# Patient Record
Sex: Male | Born: 1935 | Race: White | Hispanic: No | Marital: Married | Smoking: Never smoker
Health system: Southern US, Community
[De-identification: ages and names within clinical notes are randomized; demographics above are authoritative.]

## PROBLEM LIST (undated history)

## (undated) DIAGNOSIS — I4892 Unspecified atrial flutter: Secondary | ICD-10-CM

## (undated) DIAGNOSIS — I6529 Occlusion and stenosis of unspecified carotid artery: Secondary | ICD-10-CM

## (undated) DIAGNOSIS — N400 Enlarged prostate without lower urinary tract symptoms: Secondary | ICD-10-CM

## (undated) DIAGNOSIS — E785 Hyperlipidemia, unspecified: Secondary | ICD-10-CM

## (undated) DIAGNOSIS — M4800 Spinal stenosis, site unspecified: Secondary | ICD-10-CM

## (undated) DIAGNOSIS — I2510 Atherosclerotic heart disease of native coronary artery without angina pectoris: Secondary | ICD-10-CM

## (undated) DIAGNOSIS — I5042 Chronic combined systolic (congestive) and diastolic (congestive) heart failure: Secondary | ICD-10-CM

## (undated) DIAGNOSIS — G629 Polyneuropathy, unspecified: Secondary | ICD-10-CM

## (undated) DIAGNOSIS — S62102A Fracture of unspecified carpal bone, left wrist, initial encounter for closed fracture: Secondary | ICD-10-CM

## (undated) DIAGNOSIS — E079 Disorder of thyroid, unspecified: Secondary | ICD-10-CM

## (undated) DIAGNOSIS — I4891 Unspecified atrial fibrillation: Secondary | ICD-10-CM

## (undated) DIAGNOSIS — I255 Ischemic cardiomyopathy: Secondary | ICD-10-CM

## (undated) DIAGNOSIS — I5022 Chronic systolic (congestive) heart failure: Secondary | ICD-10-CM

## (undated) HISTORY — PX: CAROTID ENDARTERECTOMY: SUR193

## (undated) HISTORY — PX: TOOTH EXTRACTION: SUR596

## (undated) HISTORY — DX: Ischemic cardiomyopathy: I25.5

## (undated) HISTORY — PX: REPLACEMENT TOTAL KNEE: SUR1224

## (undated) HISTORY — DX: Spinal stenosis, site unspecified: M48.00

## (undated) HISTORY — DX: Atherosclerotic heart disease of native coronary artery without angina pectoris: I25.10

## (undated) HISTORY — DX: Disorder of thyroid, unspecified: E07.9

## (undated) HISTORY — DX: Occlusion and stenosis of unspecified carotid artery: I65.29

## (undated) HISTORY — DX: Fracture of unspecified carpal bone, left wrist, initial encounter for closed fracture: S62.102A

## (undated) HISTORY — DX: Chronic combined systolic (congestive) and diastolic (congestive) heart failure: I50.42

## (undated) HISTORY — DX: Hyperlipidemia, unspecified: E78.5

## (undated) HISTORY — DX: Unspecified atrial flutter: I48.92

## (undated) HISTORY — DX: Polyneuropathy, unspecified: G62.9

## (undated) HISTORY — DX: Unspecified atrial fibrillation: I48.91

## (undated) HISTORY — DX: Chronic systolic (congestive) heart failure: I50.22

---

## 1973-04-30 HISTORY — PX: BACK SURGERY: SHX140

## 1994-04-30 HISTORY — PX: CORONARY ARTERY BYPASS GRAFT: SHX141

## 2005-06-22 ENCOUNTER — Emergency Department: Payer: Self-pay | Admitting: Emergency Medicine

## 2005-07-02 ENCOUNTER — Emergency Department: Payer: Self-pay | Admitting: Emergency Medicine

## 2005-07-04 ENCOUNTER — Emergency Department: Payer: Self-pay | Admitting: Emergency Medicine

## 2005-08-22 ENCOUNTER — Emergency Department: Payer: Self-pay | Admitting: Internal Medicine

## 2006-09-12 ENCOUNTER — Ambulatory Visit: Payer: Self-pay | Admitting: Family Medicine

## 2006-09-20 ENCOUNTER — Inpatient Hospital Stay: Payer: Self-pay | Admitting: Vascular Surgery

## 2006-10-03 ENCOUNTER — Ambulatory Visit: Payer: Self-pay | Admitting: Family Medicine

## 2006-10-14 ENCOUNTER — Ambulatory Visit: Payer: Self-pay | Admitting: Vascular Surgery

## 2006-10-29 ENCOUNTER — Ambulatory Visit: Payer: Self-pay | Admitting: Family Medicine

## 2008-09-29 ENCOUNTER — Ambulatory Visit: Payer: Self-pay | Admitting: Vascular Surgery

## 2008-10-13 ENCOUNTER — Ambulatory Visit: Payer: Self-pay | Admitting: Vascular Surgery

## 2008-10-15 ENCOUNTER — Inpatient Hospital Stay: Payer: Self-pay | Admitting: Vascular Surgery

## 2008-12-23 ENCOUNTER — Encounter: Payer: Self-pay | Admitting: Cardiovascular Disease

## 2009-01-13 ENCOUNTER — Encounter: Payer: Self-pay | Admitting: Cardiovascular Disease

## 2009-01-17 ENCOUNTER — Encounter: Payer: Self-pay | Admitting: Cardiovascular Disease

## 2009-01-19 ENCOUNTER — Encounter: Payer: Self-pay | Admitting: Cardiovascular Disease

## 2009-04-28 ENCOUNTER — Encounter: Payer: Self-pay | Admitting: Cardiovascular Disease

## 2009-06-27 ENCOUNTER — Encounter: Payer: Self-pay | Admitting: Cardiovascular Disease

## 2009-07-13 ENCOUNTER — Encounter: Payer: Self-pay | Admitting: Cardiovascular Disease

## 2009-07-14 ENCOUNTER — Encounter: Payer: Self-pay | Admitting: Cardiovascular Disease

## 2009-12-14 ENCOUNTER — Encounter: Payer: Self-pay | Admitting: Cardiovascular Disease

## 2010-01-09 ENCOUNTER — Ambulatory Visit: Payer: Self-pay | Admitting: Cardiovascular Disease

## 2010-01-09 DIAGNOSIS — E118 Type 2 diabetes mellitus with unspecified complications: Secondary | ICD-10-CM

## 2010-01-09 DIAGNOSIS — I6529 Occlusion and stenosis of unspecified carotid artery: Secondary | ICD-10-CM

## 2010-01-09 DIAGNOSIS — I2510 Atherosclerotic heart disease of native coronary artery without angina pectoris: Secondary | ICD-10-CM | POA: Insufficient documentation

## 2010-01-09 DIAGNOSIS — E785 Hyperlipidemia, unspecified: Secondary | ICD-10-CM | POA: Insufficient documentation

## 2010-01-17 ENCOUNTER — Encounter: Payer: Self-pay | Admitting: Cardiovascular Disease

## 2010-05-30 NOTE — Letter (Signed)
Summary: [facility]   Imported By: Harlon Flor 01/10/2010 08:10:35  _____________________________________________________________________  External Attachment:    Type:   Image     Comment:   External Document

## 2010-05-30 NOTE — Letter (Signed)
Summary: Medical Record Release  Medical Record Release   Imported By: Harlon Flor 01/18/2010 10:19:49  _____________________________________________________________________  External Attachment:    Type:   Image     Comment:   External Document

## 2010-05-30 NOTE — Letter (Signed)
Summary: [facility]   Imported By: Harlon Flor 01/10/2010 08:10:13  _____________________________________________________________________  External Attachment:    Type:   Image     Comment:   External Document

## 2010-05-30 NOTE — Assessment & Plan Note (Signed)
Summary: NP6/AMD   Visit Type:  Initial Consult Primary Provider:  Dewaine Oats, M.D.  CC:  c/o shortness of breath..  History of Present Illness: 77 year old with history of coronary artery disease, bypass surgery in 1996 in [place], peripheral vascular disease with carotid endarterectomy in 2000 and on the left, long history of diabetes, recent worsening back pain with MRI showing spinal stenosis, hyperlipidemia who presents to establish care.  He reports that from a cardiac perspective, he has been doing well. He was seen by an outside cardiologist in [place] and was unhappy with the care there. He reports that recently his hemoglobin A1c was elevated in August of this should have improved as his measurements were taken after several cortisone shots to his back. He is active, recent he walks with his dog with minimal back pain following the cortisone shots.  He did report an episode of significant pain in his back leading to shortness of breath where he had to stop. He had a Holter monitor which she reports was unrevealing. He's not had pain like that since the shots were given and he denies any significant shortness of breath with exertion such as when he walks his dog.  He complains that he is having difficulty urinating. Dr. Arlana Pouch had held his terazosin for hypotension. He will call him tomorrow to see if he can restart the medication. he denies having any dizziness or lightheadedness.  Notes indicate an ejection fraction of 40% in March of 2011 by echocardiogram.  MRI of his back shows severe spinal stenosis at L2 and L3, other foraminal stenoses of the lumbar region.  Preventive Screening-Counseling & Management  Alcohol-Tobacco     Smoking Status: never  Caffeine-Diet-Exercise     Does Patient Exercise: no  Current Medications (verified): 1)  Glipizide 5 Mg Tabs (Glipizide) .... One Tablet Two Times A Day 2)  Aspir-Low 81 Mg Tbec (Aspirin) .... One Tablet Once Daily 3)   Simvastatin 40 Mg Tabs (Simvastatin) .... One Tablet At Bedtime 4)  Lisinopril 2.5 Mg Tabs (Lisinopril) .... One Tablet Once Daily 5)  Meloxicam 15 Mg Tabs (Meloxicam) .... One Tablet Once Daily 6)  Vitamin B-12 250 Mcg Tabs (Cyanocobalamin) .... One Tablet Once Daily 7)  Folic Acid 1 Mg Tabs (Folic Acid) .... One Tablet Once Daily 8)  Atenolol 25 Mg Tabs (Atenolol) .... One Tablet Once Daily 9)  Digoxin 0.25 Mg Tabs (Digoxin) .... One Tablet Once Daily 10)  Terazosin Hcl 1 Mg Caps (Terazosin Hcl) .... One Tablet Once Daily 11)  Aleve 220 Mg Tabs (Naproxen Sodium) .... Two Tablets Once Daily 12)  Metformin Hcl 500 Mg Tabs (Metformin Hcl) .... One Tablet Two Times A Day  Allergies (verified): No Known Drug Allergies  Past History:  Social History: Last updated: 01/09/2010 Retired  Married  Tobacco Use - No.  Alcohol Use - no Regular Exercise - no  Risk Factors: Exercise: no (01/09/2010)  Risk Factors: Smoking Status: never (01/09/2010)  Past Medical History: Diabetes Hyperlipidemia BPH ASHD with previous bypass. peripheral neuropathy spinal stenosis mild hypotension  Past Surgical History: left knee replacement CABG x 3 1996 back surgery 1975 Carotid Endarterectomy 2010  Social History: Retired  Married  Tobacco Use - No.  Alcohol Use - no Regular Exercise - no Smoking Status:  never Does Patient Exercise:  no  Review of Systems  The patient denies fever, weight loss, weight gain, vision loss, decreased hearing, hoarseness, chest pain, syncope, dyspnea on exertion, peripheral edema, prolonged cough, abdominal pain,  incontinence, muscle weakness, depression, and enlarged lymph nodes.    Vital Signs:  Patient profile:   77 year old male Height:      71 inches Weight:      207 pounds BMI:     28.97 Pulse rate:   59 / minute BP sitting:   120 / 62  (left arm) Cuff size:   regular  Vitals Entered By: Bishop Dublin, CMA (January 09, 2010 4:00  PM)  Physical Exam  General:  Well developed, well nourished, in no acute distress. Head:  normocephalic and atraumatic Neck:  Neck supple, no JVD. No masses, thyromegaly or abnormal cervical nodes. Lungs:  Clear bilaterally to auscultation and percussion. Heart:  Non-displaced PMI, chest non-tender; regular rate and rhythm, S1, S2 without murmurs, rubs or gallops. Carotid upstroke normal, no bruit. Normal abdominal aortic size, no bruits. Pedals normal pulses. No edema, no varicosities. Abdomen:  Bowel sounds positive; abdomen soft and non-tender without masses Msk:  Back normal, normal gait. Muscle strength and tone normal. Pulses:  pulses normal in all 4 extremities Extremities:  No clubbing or cyanosis. Neurologic:  Alert and oriented x 3. Skin:  Intact without lesions or rashes. Psych:  Normal affect.   Impression & Recommendations:  Problem # 1:  CAD (ICD-414.00) history of bypass surgery 14 years ago. He denies having any chest pain. No stents placed in the past since his bypass. We'll try to obtain all of his records including his last stress test. He is not interested in performing any testing at this time as he feels well.  I have suggested to him that if he has additional episodes of shortness of breath with exertion as he had previously, that he contact our office for further evaluation. His most recent episode seemed to be associated with severe pain from his back.  His updated medication list for this problem includes:    Aspir-low 81 Mg Tbec (Aspirin) ..... One tablet once daily    Lisinopril 2.5 Mg Tabs (Lisinopril) ..... One tablet once daily    Atenolol 25 Mg Tabs (Atenolol) ..... One tablet once daily  Problem # 2:  HYPERLIPIDEMIA-MIXED (ICD-272.4) His cholesterol was elevated in August. He is above goal of LDL greater than 70. His diabetes was elevated, possibly secondary to his recent cortisone shots. We will continue to closely monitor his diabetes and  lipids and have told him the ideal goal for his cholesterol numbers. We can adjust this in the future if needed.  His updated medication list for this problem includes:    Simvastatin 40 Mg Tabs (Simvastatin) ..... One tablet at bedtime  Problem # 3:  DIAB W/O COMP TYPE II/UNS NOT STATED UNCNTRL (ICD-250.00) he reports that he is very careful with his diet and his sugars have improved after the epidurals.  His updated medication list for this problem includes:    Glipizide 5 Mg Tabs (Glipizide) ..... One tablet two times a day    Aspir-low 81 Mg Tbec (Aspirin) ..... One tablet once daily    Lisinopril 2.5 Mg Tabs (Lisinopril) ..... One tablet once daily    Metformin Hcl 500 Mg Tabs (Metformin hcl) ..... One tablet two times a day  Problem # 4:  CAROTID ARTERY STENOSIS, WITHOUT INFARCTION (ICD-433.10) We will try to obtain a carotid ultrasound and lower extremity arterial ultrasound from Dr. Earnestine Leys for our records.  His updated medication list for this problem includes:    Aspir-low 81 Mg Tbec (Aspirin) ..... One tablet once daily  Other Orders: EKG w/ Interpretation (93000)

## 2010-05-30 NOTE — Letter (Signed)
Summary: [facility]  [facility]   Imported By: Marylou Mccoy 02/01/2010 11:46:49  _____________________________________________________________________  External Attachment:    Type:   Image     Comment:   External Document

## 2010-05-30 NOTE — Letter (Signed)
Summary: [facility]  [facility]   Imported By: Marylou Mccoy 02/01/2010 11:47:40  _____________________________________________________________________  External Attachment:    Type:   Image     Comment:   External Document

## 2010-05-30 NOTE — Letter (Signed)
Summary: [place] - Carotid Duplex Exam   [facility]   Imported By: Marylou Mccoy 02/01/2010 11:45:52  _____________________________________________________________________  External Attachment:    Type:   Image     Comment:   External Document

## 2010-05-30 NOTE — Letter (Signed)
Summary: [facility]   Imported By: Harlon Flor 01/10/2010 08:09:53  _____________________________________________________________________  External Attachment:    Type:   Image     Comment:   External Document

## 2010-05-30 NOTE — Letter (Signed)
Summary: PHI  PHI   Imported By: Harlon Flor 01/10/2010 15:02:50  _____________________________________________________________________  External Attachment:    Type:   Image     Comment:   External Document

## 2010-05-30 NOTE — Letter (Signed)
Summary: [facility] - Office Note  [facility] - Office Note   Imported By: Marylou Mccoy 02/01/2010 11:48:40  _____________________________________________________________________  External Attachment:    Type:   Image     Comment:   External Document

## 2010-06-27 ENCOUNTER — Telehealth: Payer: Self-pay | Admitting: Cardiovascular Disease

## 2010-07-06 ENCOUNTER — Encounter: Payer: Self-pay | Admitting: Cardiovascular Disease

## 2010-07-06 NOTE — Progress Notes (Signed)
Summary: Return Visit?  Phone Note Call from Patient Call back at [facility] Phone [phone]   Caller: Self Call For: Lasasha Brophy Summary of Call: When does the patient need to be seen again?  It does not say in his last note. Initial call taken by: Harlon Flor,  June 27, 2010 1:39 PM  Follow-up for Phone Call        I typically see patients with CABG every 6 months

## 2010-07-13 ENCOUNTER — Ambulatory Visit: Payer: Self-pay | Admitting: Cardiovascular Disease

## 2010-08-01 ENCOUNTER — Ambulatory Visit (INDEPENDENT_AMBULATORY_CARE_PROVIDER_SITE_OTHER): Payer: Medicare Other | Admitting: Cardiovascular Disease

## 2010-08-01 ENCOUNTER — Encounter: Payer: Self-pay | Admitting: Cardiovascular Disease

## 2010-08-01 DIAGNOSIS — E785 Hyperlipidemia, unspecified: Secondary | ICD-10-CM

## 2010-08-01 DIAGNOSIS — I2510 Atherosclerotic heart disease of native coronary artery without angina pectoris: Secondary | ICD-10-CM

## 2010-08-01 DIAGNOSIS — E119 Type 2 diabetes mellitus without complications: Secondary | ICD-10-CM

## 2010-08-01 DIAGNOSIS — I6529 Occlusion and stenosis of unspecified carotid artery: Secondary | ICD-10-CM

## 2010-08-01 NOTE — Patient Instructions (Signed)
Continue current medications. Continue to watch your diet. Your physician recommends that you schedule a follow-up appointment in: 6 months

## 2010-08-01 NOTE — Assessment & Plan Note (Signed)
No further testing at this time. Have asked him to contact me if he has worsening shortness of breath and chest pain with minimal exertion as we could perform a stress test or catheterization.

## 2010-08-01 NOTE — Progress Notes (Signed)
   Patient ID: Mark Dougherty, male    DOB: December 24, 1926, 77 y.o.   MRN: [id number]  HPI Comments: 77 year old with history of coronary artery disease, bypass surgery in 1996 in [place], peripheral vascular disease with carotid endarterectomy in 2000 and on the left, long history of diabetes, recent worsening back pain with MRI showing spinal stenosis, hyperlipidemia who presents to establish care.  He reports that from a cardiac perspective, he has been doing well. He does have some chest tightness and shortness of breath from underlying severe back pain. This comes on when he bends over to walk. If he uses his walker, he does not have any significant chest tightness or shortness of breath. He also does not have significant back pain if he uses his walker.  He is not able to walk long distances or exercise to any degree because of his ongoing back discomfort.  Notes indicate an ejection fraction of 40% in March of 2011 by echocardiogram.  MRI of his back shows severe spinal stenosis at L2 and L3, other foraminal stenoses of the lumbar region.     Review of Systems  Constitutional: Negative.   HENT: Negative.   Eyes: Negative.   Respiratory: Positive for shortness of breath. Negative for cough, choking, chest tightness and wheezing.   Cardiovascular: Negative.   Gastrointestinal: Negative.   Musculoskeletal: Positive for back pain and arthralgias. Negative for myalgias, joint swelling and gait problem.  Skin: Negative.   Neurological: Negative.   Hematological: Negative.   Psychiatric/Behavioral: Negative.    BP 90/60  Pulse 59  Ht 5\' 11"  (1.803 m)  Wt 211 lb (95.709 kg)  BMI 29.43 kg/m2   Physical Exam  [nursing notereviewed. Constitutional: He is oriented to person, place, and time. He appears well-developed and well-nourished.  HENT:  Head: Normocephalic.  Nose: Nose normal.  Mouth/Throat: Oropharynx is clear and moist.  Eyes: Conjunctivae are normal. Pupils are equal, round,  and reactive to light.  Neck: Normal range of motion. Neck supple. No JVD present.  Cardiovascular: Normal rate, regular rhythm, normal heart sounds and intact distal pulses.  Exam reveals no gallop and no friction rub.   No murmur heard. Pulmonary/Chest: Effort normal and breath sounds normal. No respiratory distress. He has no wheezes. He has no rales. He exhibits no tenderness.  Abdominal: Soft. Bowel sounds are normal. He exhibits no distension. There is no tenderness.  Musculoskeletal: Normal range of motion. He exhibits no edema and no tenderness.  Lymphadenopathy:    He has no cervical adenopathy.  Neurological: He is alert and oriented to person, place, and time. Coordination normal.  Skin: Skin is warm and dry. No rash noted. No erythema.  Psychiatric: He has a normal mood and affect. His behavior is normal. Judgment and thought content normal.           Assessment and Plan

## 2010-08-01 NOTE — Assessment & Plan Note (Signed)
Clinical auscultation suggests no severe stenoses. We'll continue aggressive medical management. Repeat ultrasound next year.

## 2010-08-01 NOTE — Assessment & Plan Note (Signed)
Hemoglobin A1c 8.7. They're encouraged him to watch his diet.Marland Kitchen

## 2010-08-01 NOTE — Assessment & Plan Note (Signed)
Continue current medication. Goal LDL less than 70. He is slightly above this and we have suggested he work on his diabetes.

## 2010-08-17 ENCOUNTER — Telehealth: Payer: Self-pay | Admitting: Cardiovascular Disease

## 2010-08-17 NOTE — Telephone Encounter (Signed)
Pt had appt with Dr. Mariah Milling 08/01/10. Is he clear to have surgery?

## 2010-08-17 NOTE — Telephone Encounter (Signed)
Needs surgical clearance for back surgery with [facility].  Phone # [phone] Fax # [phone]

## 2010-08-17 NOTE — Telephone Encounter (Signed)
I think I signed a piece of paper that was in the file. Ok to proceed with surgery

## 2010-08-18 NOTE — Telephone Encounter (Signed)
Spoke to RN at [employer] [facility], asked them to re-fax clearance form, when they did, it was the one that has already been filled out and signed by Dr. Mariah Milling. Called pt also and LM of this information on VM. Pt is cleared for surgery and documentation has been sent.

## 2010-08-23 ENCOUNTER — Ambulatory Visit (HOSPITAL_COMMUNITY)
Admission: RE | Admit: 2010-08-23 | Discharge: 2010-08-23 | Disposition: A | Discharge status: Home / Self Care | Payer: Medicare Other | Source: Ambulatory Visit | Attending: Specialist | Admitting: Specialist

## 2010-08-23 ENCOUNTER — Other Ambulatory Visit (HOSPITAL_COMMUNITY): Payer: Self-pay | Admitting: Specialist

## 2010-08-23 ENCOUNTER — Other Ambulatory Visit (HOSPITAL_COMMUNITY): Payer: Self-pay

## 2010-08-23 ENCOUNTER — Encounter (HOSPITAL_COMMUNITY)
Admission: RE | Admit: 2010-08-23 | Discharge: 2010-08-23 | Disposition: A | Discharge status: Home / Self Care | Payer: Medicare Other | Source: Ambulatory Visit | Attending: Specialist | Admitting: Specialist

## 2010-08-23 DIAGNOSIS — M48061 Spinal stenosis, lumbar region without neurogenic claudication: Secondary | ICD-10-CM | POA: Insufficient documentation

## 2010-08-23 DIAGNOSIS — Z01812 Encounter for preprocedural laboratory examination: Secondary | ICD-10-CM | POA: Insufficient documentation

## 2010-08-23 DIAGNOSIS — Z01811 Encounter for preprocedural respiratory examination: Secondary | ICD-10-CM | POA: Insufficient documentation

## 2010-08-23 DIAGNOSIS — E119 Type 2 diabetes mellitus without complications: Secondary | ICD-10-CM | POA: Insufficient documentation

## 2010-08-23 LAB — URINALYSIS, ROUTINE W REFLEX MICROSCOPIC
Nitrite: NEGATIVE
Protein, ur: NEGATIVE mg/dL
Specific Gravity, Urine: 1.031 — ABNORMAL HIGH (ref 1.005–1.030)
Urobilinogen, UA: 1 mg/dL (ref 0.0–1.0)

## 2010-08-23 LAB — DIFFERENTIAL
Basophils Absolute: 0 10*3/uL (ref 0.0–0.1)
Eosinophils Absolute: 0.3 10*3/uL (ref 0.0–0.7)
Eosinophils Relative: 3 % (ref 0–5)
Monocytes Absolute: 1.1 10*3/uL — ABNORMAL HIGH (ref 0.1–1.0)

## 2010-08-23 LAB — COMPREHENSIVE METABOLIC PANEL
Albumin: 3.5 g/dL (ref 3.5–5.2)
Alkaline Phosphatase: 54 U/L (ref 39–117)
BUN: 30 mg/dL — ABNORMAL HIGH (ref 6–23)
Chloride: 107 mEq/L (ref 96–112)
Creatinine, Ser: 1.36 mg/dL (ref 0.4–1.5)
GFR calc non Af Amer: 50 mL/min — ABNORMAL LOW (ref >60)
Glucose, Bld: 152 mg/dL — ABNORMAL HIGH (ref 70–99)
Potassium: 4.7 mEq/L (ref 3.5–5.1)
Total Bilirubin: 0.3 mg/dL (ref 0.3–1.2)

## 2010-08-23 LAB — SURGICAL PCR SCREEN: Staphylococcus aureus: POSITIVE — AB

## 2010-08-23 LAB — CBC
HCT: 39 % (ref 39.0–52.0)
MCHC: 33.3 g/dL (ref 30.0–36.0)
MCV: 95.8 fL (ref 78.0–100.0)
Platelets: 256 10*3/uL (ref 150–400)
RDW: 12.7 % (ref 11.5–15.5)
WBC: 9.8 10*3/uL (ref 4.0–10.5)

## 2010-08-23 LAB — APTT: aPTT: 28 seconds (ref 24–37)

## 2010-08-23 LAB — PROTIME-INR: INR: 0.95 (ref 0.00–1.49)

## 2010-08-25 ENCOUNTER — Telehealth: Payer: Self-pay | Admitting: Cardiovascular Disease

## 2010-08-25 NOTE — Telephone Encounter (Signed)
LOV,12 lead faxed to [facility] @ [phone] 08/25/10/KM

## 2010-08-28 ENCOUNTER — Inpatient Hospital Stay (HOSPITAL_COMMUNITY): Admission: RE | Admit: 2010-08-28 | Payer: Self-pay | Source: Ambulatory Visit | Admitting: Specialist

## 2010-08-28 ENCOUNTER — Inpatient Hospital Stay (HOSPITAL_COMMUNITY): Payer: Medicare Other

## 2010-08-28 ENCOUNTER — Inpatient Hospital Stay (HOSPITAL_COMMUNITY)
Admission: RE | Admit: 2010-08-28 | Discharge: 2010-09-04 | DRG: 490 | Disposition: A | Discharge status: Home Health | Payer: Medicare Other | Source: Ambulatory Visit | Attending: Specialist | Admitting: Specialist

## 2010-08-28 DIAGNOSIS — E871 Hypo-osmolality and hyponatremia: Secondary | ICD-10-CM | POA: Diagnosis not present

## 2010-08-28 DIAGNOSIS — T4275XA Adverse effect of unspecified antiepileptic and sedative-hypnotic drugs, initial encounter: Secondary | ICD-10-CM | POA: Diagnosis not present

## 2010-08-28 DIAGNOSIS — I519 Heart disease, unspecified: Secondary | ICD-10-CM | POA: Diagnosis not present

## 2010-08-28 DIAGNOSIS — M48061 Spinal stenosis, lumbar region without neurogenic claudication: Principal | ICD-10-CM | POA: Diagnosis present

## 2010-08-28 DIAGNOSIS — I509 Heart failure, unspecified: Secondary | ICD-10-CM | POA: Diagnosis present

## 2010-08-28 DIAGNOSIS — E119 Type 2 diabetes mellitus without complications: Secondary | ICD-10-CM | POA: Diagnosis present

## 2010-08-28 DIAGNOSIS — I10 Essential (primary) hypertension: Secondary | ICD-10-CM | POA: Diagnosis present

## 2010-08-28 DIAGNOSIS — F29 Unspecified psychosis not due to a substance or known physiological condition: Secondary | ICD-10-CM | POA: Diagnosis not present

## 2010-08-28 DIAGNOSIS — I4892 Unspecified atrial flutter: Secondary | ICD-10-CM | POA: Diagnosis not present

## 2010-08-28 DIAGNOSIS — K929 Disease of digestive system, unspecified: Secondary | ICD-10-CM | POA: Diagnosis not present

## 2010-08-28 DIAGNOSIS — D62 Acute posthemorrhagic anemia: Secondary | ICD-10-CM | POA: Diagnosis not present

## 2010-08-28 DIAGNOSIS — Z951 Presence of aortocoronary bypass graft: Secondary | ICD-10-CM

## 2010-08-28 DIAGNOSIS — K560 Paralytic ileus: Secondary | ICD-10-CM | POA: Diagnosis not present

## 2010-08-28 DIAGNOSIS — I498 Other specified cardiac arrhythmias: Secondary | ICD-10-CM | POA: Diagnosis not present

## 2010-08-28 DIAGNOSIS — I2510 Atherosclerotic heart disease of native coronary artery without angina pectoris: Secondary | ICD-10-CM | POA: Diagnosis present

## 2010-08-28 DIAGNOSIS — M4310 Spondylolisthesis, site unspecified: Secondary | ICD-10-CM | POA: Diagnosis present

## 2010-08-28 LAB — GLUCOSE, CAPILLARY
Glucose-Capillary: 139 mg/dL — ABNORMAL HIGH (ref 70–99)
Glucose-Capillary: 137 mg/dL — ABNORMAL HIGH (ref 70–99)
Glucose-Capillary: 114 mg/dL — ABNORMAL HIGH (ref 70–99)

## 2010-08-29 DIAGNOSIS — R002 Palpitations: Secondary | ICD-10-CM

## 2010-08-29 LAB — BASIC METABOLIC PANEL
CO2: 26 mEq/L (ref 19–32)
Chloride: 103 mEq/L (ref 96–112)
Creatinine, Ser: 1.11 mg/dL (ref 0.4–1.5)
GFR calc Af Amer: >60 mL/min (ref >60)
Glucose, Bld: 125 mg/dL — ABNORMAL HIGH (ref 70–99)

## 2010-08-29 LAB — CBC
Hemoglobin: 10.7 g/dL — ABNORMAL LOW (ref 13.0–17.0)
MCH: 31.4 pg (ref 26.0–34.0)
MCV: 95.6 fL (ref 78.0–100.0)
RBC: 3.41 MIL/uL — ABNORMAL LOW (ref 4.22–5.81)

## 2010-08-29 LAB — URINALYSIS, ROUTINE W REFLEX MICROSCOPIC
Glucose, UA: NEGATIVE mg/dL
Leukocytes, UA: NEGATIVE
Protein, ur: NEGATIVE mg/dL
Specific Gravity, Urine: 1.015 (ref 1.005–1.030)
Urobilinogen, UA: 0.2 mg/dL (ref 0.0–1.0)

## 2010-08-29 LAB — URINE MICROSCOPIC-ADD ON

## 2010-08-29 LAB — GLUCOSE, CAPILLARY

## 2010-08-30 DIAGNOSIS — I4891 Unspecified atrial fibrillation: Secondary | ICD-10-CM

## 2010-08-30 LAB — GLUCOSE, CAPILLARY
Glucose-Capillary: 143 mg/dL — ABNORMAL HIGH (ref 70–99)
Glucose-Capillary: 148 mg/dL — ABNORMAL HIGH (ref 70–99)
Glucose-Capillary: 170 mg/dL — ABNORMAL HIGH (ref 70–99)

## 2010-08-30 LAB — BASIC METABOLIC PANEL
CO2: 25 mEq/L (ref 19–32)
Chloride: 99 mEq/L (ref 96–112)
Creatinine, Ser: 1.27 mg/dL (ref 0.4–1.5)
GFR calc Af Amer: >60 mL/min (ref >60)
Glucose, Bld: 145 mg/dL — ABNORMAL HIGH (ref 70–99)

## 2010-08-30 LAB — HEMOGLOBIN AND HEMATOCRIT, BLOOD
HCT: 30 % — ABNORMAL LOW (ref 39.0–52.0)
Hemoglobin: 9.9 g/dL — ABNORMAL LOW (ref 13.0–17.0)

## 2010-08-30 LAB — CARDIAC PANEL(CRET KIN+CKTOT+MB+TROPI): Troponin I: <0.3 ng/mL (ref <0.30)

## 2010-08-31 DIAGNOSIS — IMO0002 Reserved for concepts with insufficient information to code with codable children: Secondary | ICD-10-CM

## 2010-08-31 DIAGNOSIS — M47817 Spondylosis without myelopathy or radiculopathy, lumbosacral region: Secondary | ICD-10-CM

## 2010-08-31 LAB — BASIC METABOLIC PANEL
CO2: 25 mEq/L (ref 19–32)
Calcium: 8.9 mg/dL (ref 8.4–10.5)
Creatinine, Ser: 1.32 mg/dL (ref 0.4–1.5)
GFR calc Af Amer: >60 mL/min (ref >60)
Glucose, Bld: 173 mg/dL — ABNORMAL HIGH (ref 70–99)

## 2010-08-31 LAB — TYPE AND SCREEN
ABO/RH(D): O POS
Antibody Screen: NEGATIVE
Unit division: 0

## 2010-08-31 LAB — GLUCOSE, CAPILLARY
Glucose-Capillary: 182 mg/dL — ABNORMAL HIGH (ref 70–99)
Glucose-Capillary: 193 mg/dL — ABNORMAL HIGH (ref 70–99)
Glucose-Capillary: 217 mg/dL — ABNORMAL HIGH (ref 70–99)

## 2010-09-01 DIAGNOSIS — I359 Nonrheumatic aortic valve disorder, unspecified: Secondary | ICD-10-CM

## 2010-09-01 LAB — CBC
HCT: 26.6 % — ABNORMAL LOW (ref 39.0–52.0)
Hemoglobin: 8.9 g/dL — ABNORMAL LOW (ref 13.0–17.0)
WBC: 9.5 10*3/uL (ref 4.0–10.5)

## 2010-09-01 LAB — BASIC METABOLIC PANEL
CO2: 26 mEq/L (ref 19–32)
Chloride: 103 mEq/L (ref 96–112)
Glucose, Bld: 139 mg/dL — ABNORMAL HIGH (ref 70–99)
Potassium: 4.5 mEq/L (ref 3.5–5.1)
Sodium: 132 mEq/L — ABNORMAL LOW (ref 135–145)

## 2010-09-01 LAB — GLUCOSE, CAPILLARY
Glucose-Capillary: 139 mg/dL — ABNORMAL HIGH (ref 70–99)
Glucose-Capillary: 152 mg/dL — ABNORMAL HIGH (ref 70–99)

## 2010-09-02 LAB — CBC
MCH: 31 pg (ref 26.0–34.0)
MCV: 93.5 fL (ref 78.0–100.0)
Platelets: 234 10*3/uL (ref 150–400)
RBC: 2.94 MIL/uL — ABNORMAL LOW (ref 4.22–5.81)
RDW: 12.5 % (ref 11.5–15.5)

## 2010-09-02 LAB — BASIC METABOLIC PANEL
Chloride: 103 mEq/L (ref 96–112)
GFR calc Af Amer: >60 mL/min (ref >60)
GFR calc non Af Amer: >60 mL/min (ref >60)
Potassium: 4.7 mEq/L (ref 3.5–5.1)
Sodium: 136 mEq/L (ref 135–145)

## 2010-09-02 LAB — HEPATIC FUNCTION PANEL
ALT: 15 U/L (ref 0–53)
Bilirubin, Direct: 0.1 mg/dL (ref 0.0–0.3)
Indirect Bilirubin: 0.2 mg/dL — ABNORMAL LOW (ref 0.3–0.9)
Total Protein: 5.7 g/dL — ABNORMAL LOW (ref 6.0–8.3)

## 2010-09-02 LAB — GLUCOSE, CAPILLARY
Glucose-Capillary: 159 mg/dL — ABNORMAL HIGH (ref 70–99)
Glucose-Capillary: 254 mg/dL — ABNORMAL HIGH (ref 70–99)
Glucose-Capillary: 167 mg/dL — ABNORMAL HIGH (ref 70–99)

## 2010-09-02 LAB — TSH: TSH: 2.159 u[IU]/mL (ref 0.350–4.500)

## 2010-09-03 LAB — GLUCOSE, CAPILLARY
Glucose-Capillary: 135 mg/dL — ABNORMAL HIGH (ref 70–99)
Glucose-Capillary: 211 mg/dL — ABNORMAL HIGH (ref 70–99)

## 2010-09-04 LAB — GLUCOSE, CAPILLARY: Glucose-Capillary: 220 mg/dL — ABNORMAL HIGH (ref 70–99)

## 2010-09-11 ENCOUNTER — Emergency Department (HOSPITAL_COMMUNITY): Payer: Medicare Other

## 2010-09-11 ENCOUNTER — Inpatient Hospital Stay (HOSPITAL_COMMUNITY)
Admission: EM | Admit: 2010-09-11 | Discharge: 2010-09-12 | DRG: 683 | Disposition: A | Discharge status: Home Health | Payer: Medicare Other | Attending: Internal Medicine | Admitting: Internal Medicine

## 2010-09-11 DIAGNOSIS — Z7982 Long term (current) use of aspirin: Secondary | ICD-10-CM

## 2010-09-11 DIAGNOSIS — I5022 Chronic systolic (congestive) heart failure: Secondary | ICD-10-CM | POA: Diagnosis present

## 2010-09-11 DIAGNOSIS — I509 Heart failure, unspecified: Secondary | ICD-10-CM | POA: Diagnosis present

## 2010-09-11 DIAGNOSIS — I959 Hypotension, unspecified: Secondary | ICD-10-CM | POA: Diagnosis present

## 2010-09-11 DIAGNOSIS — Z951 Presence of aortocoronary bypass graft: Secondary | ICD-10-CM

## 2010-09-11 DIAGNOSIS — R55 Syncope and collapse: Secondary | ICD-10-CM | POA: Diagnosis present

## 2010-09-11 DIAGNOSIS — R195 Other fecal abnormalities: Secondary | ICD-10-CM | POA: Diagnosis present

## 2010-09-11 DIAGNOSIS — T481X5A Adverse effect of skeletal muscle relaxants [neuromuscular blocking agents], initial encounter: Secondary | ICD-10-CM | POA: Diagnosis present

## 2010-09-11 DIAGNOSIS — I10 Essential (primary) hypertension: Secondary | ICD-10-CM | POA: Diagnosis present

## 2010-09-11 DIAGNOSIS — E785 Hyperlipidemia, unspecified: Secondary | ICD-10-CM | POA: Diagnosis present

## 2010-09-11 DIAGNOSIS — E860 Dehydration: Secondary | ICD-10-CM | POA: Diagnosis present

## 2010-09-11 DIAGNOSIS — R404 Transient alteration of awareness: Secondary | ICD-10-CM | POA: Diagnosis present

## 2010-09-11 DIAGNOSIS — E119 Type 2 diabetes mellitus without complications: Secondary | ICD-10-CM | POA: Diagnosis present

## 2010-09-11 DIAGNOSIS — N179 Acute kidney failure, unspecified: Principal | ICD-10-CM | POA: Diagnosis present

## 2010-09-11 DIAGNOSIS — D649 Anemia, unspecified: Secondary | ICD-10-CM | POA: Diagnosis present

## 2010-09-11 DIAGNOSIS — Z9889 Other specified postprocedural states: Secondary | ICD-10-CM

## 2010-09-11 DIAGNOSIS — I2510 Atherosclerotic heart disease of native coronary artery without angina pectoris: Secondary | ICD-10-CM | POA: Diagnosis present

## 2010-09-11 DIAGNOSIS — N400 Enlarged prostate without lower urinary tract symptoms: Secondary | ICD-10-CM | POA: Diagnosis present

## 2010-09-11 DIAGNOSIS — T465X5A Adverse effect of other antihypertensive drugs, initial encounter: Secondary | ICD-10-CM | POA: Diagnosis present

## 2010-09-11 DIAGNOSIS — Z79899 Other long term (current) drug therapy: Secondary | ICD-10-CM

## 2010-09-11 LAB — PROTIME-INR
INR: 1.12 (ref 0.00–1.49)
Prothrombin Time: 14.6 seconds (ref 11.6–15.2)

## 2010-09-11 LAB — APTT: aPTT: 31 seconds (ref 24–37)

## 2010-09-11 LAB — CBC
Hemoglobin: 11.6 g/dL — ABNORMAL LOW (ref 13.0–17.0)
MCH: 30.9 pg (ref 26.0–34.0)
MCV: 93.9 fL (ref 78.0–100.0)
RBC: 3.75 MIL/uL — ABNORMAL LOW (ref 4.22–5.81)

## 2010-09-11 LAB — COMPREHENSIVE METABOLIC PANEL
ALT: 14 U/L (ref 0–53)
Albumin: 3.1 g/dL — ABNORMAL LOW (ref 3.5–5.2)
BUN: 25 mg/dL — ABNORMAL HIGH (ref 6–23)
Calcium: 9.4 mg/dL (ref 8.4–10.5)
Glucose, Bld: 196 mg/dL — ABNORMAL HIGH (ref 70–99)
Sodium: 133 mEq/L — ABNORMAL LOW (ref 135–145)
Total Protein: 6.8 g/dL (ref 6.0–8.3)

## 2010-09-11 LAB — POCT CARDIAC MARKERS
CKMB, poc: 1.1 ng/mL (ref 1.0–8.0)
Myoglobin, poc: 161 ng/mL (ref 12–200)
Troponin i, poc: <0.05 ng/mL (ref 0.00–0.09)

## 2010-09-11 LAB — DIFFERENTIAL
Lymphs Abs: 2 10*3/uL (ref 0.7–4.0)
Monocytes Absolute: 1.5 10*3/uL — ABNORMAL HIGH (ref 0.1–1.0)
Monocytes Relative: 12 % (ref 3–12)
Neutro Abs: 8.1 10*3/uL — ABNORMAL HIGH (ref 1.7–7.7)
Neutrophils Relative %: 69 % (ref 43–77)

## 2010-09-11 LAB — SAMPLE TO BLOOD BANK

## 2010-09-11 LAB — CARDIAC PANEL(CRET KIN+CKTOT+MB+TROPI)
Relative Index: INVALID (ref 0.0–2.5)
Total CK: 34 U/L (ref 7–232)

## 2010-09-12 LAB — GLUCOSE, CAPILLARY
Glucose-Capillary: 124 mg/dL — ABNORMAL HIGH (ref 70–99)
Glucose-Capillary: 138 mg/dL — ABNORMAL HIGH (ref 70–99)
Glucose-Capillary: 125 mg/dL — ABNORMAL HIGH (ref 70–99)

## 2010-09-12 LAB — HEMOGLOBIN A1C
Hgb A1c MFr Bld: 7.2 % — ABNORMAL HIGH (ref <5.7)
Mean Plasma Glucose: 160 mg/dL — ABNORMAL HIGH (ref <117)

## 2010-09-12 LAB — CBC
MCH: 30.6 pg (ref 26.0–34.0)
MCHC: 33.1 g/dL (ref 30.0–36.0)
MCV: 92.5 fL (ref 78.0–100.0)
Platelets: 422 10*3/uL — ABNORMAL HIGH (ref 150–400)
RDW: 12.7 % (ref 11.5–15.5)

## 2010-09-12 LAB — BASIC METABOLIC PANEL
BUN: 20 mg/dL (ref 6–23)
CO2: 24 mEq/L (ref 19–32)
Chloride: 99 mEq/L (ref 96–112)
Glucose, Bld: 103 mg/dL — ABNORMAL HIGH (ref 70–99)
Potassium: 4.1 mEq/L (ref 3.5–5.1)

## 2010-09-12 LAB — URINALYSIS, ROUTINE W REFLEX MICROSCOPIC
Bilirubin Urine: NEGATIVE
Hgb urine dipstick: NEGATIVE
Nitrite: NEGATIVE
Protein, ur: NEGATIVE mg/dL
Urobilinogen, UA: 0.2 mg/dL (ref 0.0–1.0)

## 2010-09-12 LAB — CARDIAC PANEL(CRET KIN+CKTOT+MB+TROPI)
CK, MB: 1.6 ng/mL (ref 0.3–4.0)
Total CK: 40 U/L (ref 7–232)
Troponin I: <0.3 ng/mL (ref <0.30)
Troponin I: <0.3 ng/mL (ref <0.30)

## 2010-09-12 LAB — TSH: TSH: 2.459 u[IU]/mL (ref 0.350–4.500)

## 2010-09-12 LAB — CORTISOL: Cortisol, Plasma: 8.6 ug/dL

## 2010-09-13 LAB — URINE CULTURE: Culture  Setup Time: 201205150942

## 2010-09-13 NOTE — Op Note (Signed)
NAME:  Mark Dougherty, Mark Dougherty NO.:  [id number]  MEDICAL RECORD NO.:  [id number]           PATIENT TYPE:  I  LOCATION:  5025                         FACILITY:  [facility]  PHYSICIAN:  Kerrin Champagne, M.D.   DATE OF BIRTH:  14-Sep-1926  DATE OF PROCEDURE:  08/28/2010 DATE OF DISCHARGE:                              OPERATIVE REPORT   PREOPERATIVE DIAGNOSES:  Lumbar spinal stenosis L2-3 and L4-5 central with foraminal narrowing at both levels.  Grade 1 spondylolisthesis at L4-5.  Previous central laminectomy at L5.  Left-sided lateral recess stenosis at L5-S1, previous left L5-S1 semihemilaminectomy.  POSTOPERATIVE DIAGNOSIS:  Grade 1 spondylolisthesis L4-5 degenerative type with severe lateral recess stenosis at the L4-5 level affecting left side greater than right side L5 nerve root, foraminal entrapment involving bilateral L4 nerve roots and left side L3 nerve root greater than right.  Left-sided lateral recess stenosis L5-S1.  PROCEDURE PERFORMED DURING THIS OPERATION:  Central laminectomy L2-3, redo central laminectomy L4-5 with bilateral L4 and bilateral L5 nerve root decompression.  Left L5-S1 redo partial hemilaminectomy for lateral recess decompression.  Decompression of bilateral L3, bilateral L4, bilateral L2, bilateral L5, and left side S1 nerve roots.  SURGEON:  Kerrin Champagne, MD  ASSISTANT:  Wende Neighbors, PA  ANESTHESIA:  General via orotracheal intubation, Dr. Laverle Hobby, supplemented with local infiltration Marcaine 0.5% in 1:200,000 epinephrine, 20 mL.  ESTIMATED BLOOD LOSS:  200 mL.  DRAINS:  Foley to straight drain and Hemovac x1.  BRIEF CLINICAL HISTORY:  The patient is an 77 year old male who has long history of lumbar spinal stenosis for which he has received numerous epidural steroid injections.  Pain had been alleviated by sitting and standing, worsens by standing and walking.  Overtime, he has problems with neurogenic claudication  appeared to be worsening to the point where he is unable to stand or ambulate more than 100 feet at the most before he must find a place to sit.  He has been seen in the office, evaluated. Epidural steroids  no longer are effective in treating his condition of spinal stenosis.  He has MRI evidence of central spinal stenosis at the L2-3 level and L4-5 and lateral recess stenosis left side L5 and L1. Pain pattern is primarily left sided into the thigh and groin area as well as buttock.  As he has failed conservative management over long periods of time, we recommended that he undergo central decompressive laminectomy at L2-3 and L4-5, left lateral recess decompression of L5- S1.  Intraoperative findings as above.  The patient was seen in the office in discussion regarding risks and benefits of the procedure described to the patient.  He has decided to undergo the decompressive laminectomy without fusion.  Risks and benefits have been discussed and signed informed consent.  DESCRIPTION OF PROCEDURE:  The patient seen in preoperative holding area and marking the left side at the L5-S1 level with an X and my initial and centrally about L2-3 and L4-5 level.  Previous old incision scar in the midline in the lumbosacral junction of approximately 15 cm in length.  The patient received  standard preoperative antibiotics with Ancef.  He was transported to the operating room on the OR stretcher. OR room #4 at [facility] was used for this procedure.  The patient was induced general anesthesia, had Foley place, and then turned to prone position.  A Jackson spine frame was used due to this patient's height.  All pressure points were well padded to the legs with PAS stockings.  Standard prep with DuraPrep solution to mid dorsal spine and mid sacral level.  Draped in the usual manner.  Iodine Vi-Drape was used.  The patient had an incision made at the expected L2 to S1 level, ellipse the old  incision scar in the midline.  Excision was carried superiorly to the L1 level.  Through the skin and subcutaneous layers, incision was carried down to the lumbodorsal fascia overlying the L1 through S1 levels.  Cobbs were used to carefully elevate paralumbar muscles off the lateral aspect of the spinous process, posterior elements at L1, L2, L3, L4 superiorly and L5 and S1.  Self-retaining cerebellar retractors were inserted.  Loupe magnification and headlamp used during this procedure.  Clamps were placed at the L5 spinous process superiorly on either side of a spinous process at the L4 level. The upper clamp at the L3-4 level and lower clamp at the L4 level. These areas were marked for identification for this.  After identification with x-rays and a Leksell rongeur was used to remove the spinous process of L2, the upper aspect of the L3 spinous process, and this was continued superiorly resecting a small portion of the inferior aspect of the spinous process of L1 about 30%.  The entire spinous process was resected deep down to the base of the lamina, this was in the midline.  The L4 residual lamina was carefully thinned using Leksellrongeur, centrally removing a small portion of the inferior aspect of the spinous process of L3 and superior aspect of L3 as well.  The L5 spinous process was resected centrally down to the base of the spinous process and central lamina.  This was then thinned over the left side of the lamina.  Osteotomes were then used to resect bone over the central portions of lamina of L5-S1 and the left-sided inferior articular process of L5 medially, resecting approximately 15% to 20% of the medial aspect of the left L5-S1 facet.  A 1/2-inch straight and three-quarter inch osteotomes were used.  Kerrisons 3 mm were used to resect the bone that was freed up here.  Then, 3-mm Kerrison was used to resect further inferior aspect of the lamina of L5 on the left side up to  the insertion of the ligamentum flavum.  Thecal sac carefully retracted and lateral recess was then decompressed on the left side using an osteotome first and then 3-mm Kerrison resecting from superior to inferior.  Scar tissue over the left S1 nerve root was carefully mobilized.  Entry over the superior aspect of the lamina of S1 then made in the paramedian area and this was continued medially freeing up the thecal sac off the superior ventral aspect of the S1 lamina and then performing foraminotomy of S1 nerve root laterally.  The scar tissue centrally was easily freed up, resected using 3-mm Kerrisons.  Scar tissue over the S1 nerve root was freed up and then superficially resected using 15-blade scalpel.  The S1 nerve root observed to be exiting out the neural foramen with very little impression.  Small amount of overlying bone in the foramen  was resected using 3-mm Kerrisons.  Lateral recess was well decompressed along the left side freeing up the lateral aspect of the S1 nerve root and thecal sac so that no further impression was noted here.  At the L4- 5 level, then central laminectomy was continued.  Osteotomes were used to resect medial aspect of the facet on the left side over approximately 25% and on the right side about 5%.  The posterior lamina was then further resected centrally using 3- and 4-mm Kerrisons, cottonoids placed to protect the thecal sac.  Central laminectomy carried from inferior to superior.  When the interlaminar space at the L3-4 level had been entered with Kerrisons and osteotomes were used to further open the laminotomy at the 4-5 level resecting bone over the medial aspect of the L3-4 facet as well as the medial border of the patient's pars and the 4- 5 facet area.  Resection was carried out first on the right side from the left side decompressing the L4 nerve root within the lateral recess as it exited out the neural foramen for decompressing then over  the nerve root using a hockey stick neural probe resecting the superior portion of the superior articular process of L5, reflected portions of ligamentum flavum along the right lateral recess.  This decompressed nicely the L4 nerve root and the L5 nerve root exiting out the L5 neural foramen on the right side.  Scar tissue off the medial aspect of facet was then identified the nerve root as it exited.  The left side was changed to the right side to be able to approach this.  Central laminectomy was then carried out at the L2-3 level by further thinning the central portions and the base of the spinous process of L2, and the medial aspect of the joint using osteotome in 2-3 level bilaterally, resecting about 5% to 10% of the joint laterally.  Kerrisons were then used to resect the bone and then osteotomized over the medial facet. Then, these were used to perform a central laminectomy over the central portions of the lamina of L2, then used superiorly to the L1-2 level where the cottonoids were used to carefully protect the ventral aspect of the neural arch of L2 resecting this into the interlaminar region of L1-2.  The hypertrophic flavum found at the L1-2 level was then carefully resected bilaterally and a small portion of the inferior aspect of the lamina of L1 was resected in order to decompress this area.  Walls of the laminotomy at the L2 level, then further resected using osteotomes bilaterally greensticking bone off the medial aspect of the facet removing more bone from the left facet than the right, approximately 10% to 15% on the right and another 20% on the left. Reflected portions of ligamentum flavum impinging on the three nerve root and four nerve root was carefully resected and the medial aspect of the superior articular process of L3 was osteotomized on both sides resecting this with the reflected flavum up to the superior portion of the superior reticular process of L3 and  this was resected using a hockey stick neural probe passed over the nerve root and resecting this over the top of the neural probe both bilaterally.  Significant foraminal stenosis left side at L3 greater than right side L3, left side L4 greater than right side L4, left side L5 greater than right side L5. Left lateral recess stenosis found at the L5-S1leve.  After decompressing the right side of the spinal canal from  the left side, we then switched sides and performed decompression of the left side L3, L4, and L5 nerve roots using osteotomes that were necessary as well as 3-mm Kerrisons carefully protecting the nerve roots visualizing at each foramen and decompressing over the tops of the nerve roots, resecting reflected ligamentum flavum in the superior articular process were necessary in order to decompress the area.  When the laminotomies had been completed, a hockey stick neural probe could be easily passed out the L2 and L3 neural foramen at the central laminotomy side of L2.  The L4-5 laminotomy site demonstrate patency of the neural foramen at 4 and 5 roots and the hockey stick neural probe could be passed out each of these neural foramen demonstrating their patency as well.  The L5-S1 level on the left hockey stick neural probe could be passed out the L5 neural foramen and S1 neural foramen as well.  Lateral recess was completely decompressed with no residual spurs.  Bone wax was applied to the bleeding cancellous bone surfaces.  Thrombin-soaked Gelfoam placed in the laminotomy areas.  Gelfoam was then removed after irrigation. Medium Hemovac drain placed in the depth of the incision exiting over the left lower lumbar spine.  The paralumbar muscles were reapproximated in the midline loosely with interrupted #1 Vicryls.  Lumbodorsal fascia reapproximated with interrupted figure-of-eight and simple sutures of #1 Vicryl.  Deep subcu layer was approximated with interrupted 0 and then  2- 0 Vicryl sutures to close the subcu layers.  Skin closed with a running subcuticular stitch of 4-0 Vicryl.  Dermabond was then applied and Mepilex bandage.  The patient was then returned to a supine position, reactivated, extubated, and returned to recovery room in satisfactory condition.  Note that prior to this patient's incision prior to the infiltration of the skin at the beginning of the case, standard time-out protocol was carried out identifying the patient, procedure, estimated blood loss, length of the procedure.  PHYSICIAN ASSISTANT'S RESPONSIBILITY:  Maud Deed performed duties of assistant physician during this case.  She was present from the beginning of the case and positioning of the patient to the end of the case and transfer the patient to the OR stretcher to return to the recovery room.  She performed duties of careful suctioning and retraction of the neural elements during decompression of this patient's lumbar spine to perform closure of the incision extending from the sacrum to the L1 level from the fascial layer to the skin applying dressing at the end of the case.     Kerrin Champagne, M.D.     JEN/MEDQ  D:  08/28/2010  T:  08/29/2010  Job:  [id number]  Electronically Signed by Vira Browns M.D. on 09/13/2010 06:05:26 PM

## 2010-09-14 ENCOUNTER — Ambulatory Visit (INDEPENDENT_AMBULATORY_CARE_PROVIDER_SITE_OTHER): Payer: Medicare Other | Admitting: Cardiovascular Disease

## 2010-09-14 ENCOUNTER — Encounter: Payer: Self-pay | Admitting: Cardiovascular Disease

## 2010-09-14 DIAGNOSIS — I6529 Occlusion and stenosis of unspecified carotid artery: Secondary | ICD-10-CM

## 2010-09-14 DIAGNOSIS — E119 Type 2 diabetes mellitus without complications: Secondary | ICD-10-CM

## 2010-09-14 DIAGNOSIS — E785 Hyperlipidemia, unspecified: Secondary | ICD-10-CM

## 2010-09-14 DIAGNOSIS — I2510 Atherosclerotic heart disease of native coronary artery without angina pectoris: Secondary | ICD-10-CM

## 2010-09-14 DIAGNOSIS — E860 Dehydration: Secondary | ICD-10-CM

## 2010-09-14 NOTE — Assessment & Plan Note (Signed)
Continue aggressive medical management of his peripheral vascular disease. Goal LDL less than 70

## 2010-09-14 NOTE — Patient Instructions (Signed)
Stop taking Lisinopril Start Iron in AM and PM Increase your fluid intake. Your physician recommends that you schedule a follow-up appointment in: 2-3 weeks

## 2010-09-14 NOTE — Assessment & Plan Note (Signed)
Continue on his statin, goal LDL less than 70. 

## 2010-09-14 NOTE — Assessment & Plan Note (Signed)
He had recent dehydration and hypotension requiring hospitalization. I suspect that he had significant IV fluids in the hospital but he has not been drinking much since he has been home. His blood pressure is borderline low again today and he does not feel well. I encouraged him to drink more fluids, increase his salt intake through the weekend. We'll hold his enalapril. If his blood pressure comes up we will monitor his heart rate closely. If able to tolerate, I would like to start low dose metoprolol as he is tachycardic.

## 2010-09-14 NOTE — Progress Notes (Signed)
Patient ID: Mark Dougherty, male    DOB: 09/13/1926, 77 y.o.   MRN: [id number]  HPI Comments: 77 year old with history of coronary artery disease, bypass surgery in 1996 in [place], peripheral vascular disease with carotid endarterectomy in 2000  on the left, long history of diabetes, recent worsening back pain with MRI showing spinal stenosis, hyperlipidemia, With recent back surgery with complications including atrial fibrillation in the perioperative, postoperative period started on amiodarone with conversion to sinus rhythm, with a repeat admission to [facility] for dehydration, hypotension and malaise who was again discharged with followup in our office today.  He reports that he felt well when he left the hospital. Today, in the waiting room, he feels weak again and had difficulty with standing. He does not have a blood pressure cuff at home and has been unable to measure his pressures. He was noted to be anemic in the hospital and has not continued his iron pills. He did have significant constipation while in the hospital likely secondary to dehydration. He has not been drinking much at home and reports that he recently bought a case of water. His metoprolol was discontinued when he left the hospital on the second admission. This was held for hypotension.  He reports that from a cardiac perspective, he has been doing well.  He is not able to walk long distances or exercise to any degree because of his ongoing back discomfort.  Notes indicate an ejection fraction of 40% in March of 2011 by echocardiogram.  MRI of his back shows severe spinal stenosis at L2 and L3, other foraminal stenoses of the lumbar region.  EKG shows sinus tachycardia with rate 105 beats per minute with nonspecific ST changes, left axis deviation/left anterior fascicular block     Review of Systems  Constitutional: Positive for fatigue.  HENT: Negative.   Eyes: Negative.   Respiratory: Negative.     Cardiovascular: Negative.   Gastrointestinal: Negative.   Musculoskeletal: Positive for back pain.  Skin: Negative.   Neurological: Positive for dizziness and weakness.  Hematological: Negative.   Psychiatric/Behavioral: Negative.   All other systems reviewed and are negative.    BP 100/58  Pulse 105  Ht 5\' 11"  (1.803 m)  Wt 204 lb (92.534 kg)  BMI 28.45 kg/m2   Physical Exam  Nursing note and vitals reviewed. Constitutional: He is oriented to person, place, and time. He appears well-developed and well-nourished.  HENT:  Head: Normocephalic.  Nose: Nose normal.  Mouth/Throat: Oropharynx is clear and moist.  Eyes: Conjunctivae are normal. Pupils are equal, round, and reactive to light.  Neck: Normal range of motion. Neck supple. No JVD present.  Cardiovascular: Regular rhythm, S1 normal, S2 normal, normal heart sounds and intact distal pulses.  Tachycardia present.  Exam reveals no gallop and no friction rub.   No murmur heard. Pulmonary/Chest: Effort normal and breath sounds normal. No respiratory distress. He has no wheezes. He has no rales. He exhibits no tenderness.  Abdominal: Soft. Bowel sounds are normal. He exhibits no distension. There is no tenderness.  Musculoskeletal: Normal range of motion. He exhibits no edema and no tenderness.  Lymphadenopathy:    He has no cervical adenopathy.  Neurological: He is alert and oriented to person, place, and time. Coordination normal.  Skin: Skin is warm and dry. No rash noted. No erythema.  Psychiatric: He has a normal mood and affect. His behavior is normal. Judgment and thought content normal.  Assessment and Plan

## 2010-09-14 NOTE — Assessment & Plan Note (Signed)
Currently with no symptoms of angina. No further workup at this time. Continue current medication regimen. 

## 2010-09-14 NOTE — Assessment & Plan Note (Signed)
We have encouraged continue his rehabe, careful diet management in an effort to keep his weight low.

## 2010-09-19 NOTE — Discharge Summary (Signed)
Mark Dougherty, Mark Dougherty               ACCOUNT NO.:  [id number]  MEDICAL RECORD NO.:  [id number]           PATIENT TYPE:  I  LOCATION:  1434                         FACILITY:  [facility]  PHYSICIAN:  Erick Blinks, MD     DATE OF BIRTH:  1927-01-31  DATE OF ADMISSION:  09/11/2010 DATE OF DISCHARGE:  09/12/2010                              DISCHARGE SUMMARY   PRIMARY CARE PHYSICIAN:  Dr. Arlana Pouch in [place]  CARDIOLOGIST:  Antonieta Iba, MD  DISCHARGE DIAGNOSES: 1. Hypotension secondary to dehydration, resolved. 2. Presyncope secondary to dehydration, resolved. 3. Acute renal failure secondary to dehydration, resolved. 4. Non-insulin dependent diabetes. 5. Chronic systolic congestive heart failure with ejection fraction of     40%. 6. Normocytic anemia. 7. Hyperlipidemia. 8. Coronary artery disease status post coronary artery bypass     grafting. 9. History of hypertension. 10.Carotid artery stenosis status post left carotid endarterectomy for     a spinal stenosis status post recent lumbar laminectomy. 11.Benign prostatic hypertrophy.  DISCHARGE MEDICATIONS: 1. MiraLax 17 g p.o. daily p.r.n. 2. Glipizide 5 mg p.o. daily. 3. Metformin 500 mg p.o. b.i.d. 1 tablet in the morning and 2 tablets     in the evening. 4. Lisinopril 2.5 mg p.o. daily. 5. Aspirin enteric-coated 81 mg p.o. daily. 6. Vitamin B12 250 mcg 1 tablet p.o. daily. 7. Folic acid 1 mg p.o. daily. 8. Simvastatin 20 mg 1 tablet p.o. daily. 9. Terazosin 1 mg 1 capsule p.o. q.h.s. 10.Amiodarone 200 mg p.o. b.i.d. 11.Ferrous sulfate 325 mg p.o. t.i.d.  Medication stopped in the hospital: 1. Metoprolol XL succinate 25 mg p.o. b.i.d. 2. Robaxin. 3. Hydrocodone/APAP.  ADMISSION HISTORY:  This is an 77 year old gentleman who has a history of coronary artery disease and recently had a lumbar laminectomy, was discharged from hospital on Sep 04, 2010.  Since he had gone home, he was increasingly lethargic,  taking his Robaxin on a scheduled basis as well as hydrocodone.  He had decrease p.o. intake and was sleeping excessively.  On the day of admission, he had a presyncopal episode.  He went to his orthopedist office and at that time was found to have a blood pressure in the 70s.  He was subsequently transferred to the ER for evaluation.  HOSPITAL COURSE: 1. Hypotension.  This was felt to be volume related.  The patient was     adequately hydrated.  He is no longer orthostatic and does not have     any dizziness.  He is ambulating in the halls without any     difficulty.  His blood pressure is normotensive at this time.  He     was recently started on metoprolol on his last admission.  We will     hold his medication for now.  He will continue his lisinopril as     well as amiodarone.  He is advised to follow up with primary care     physician in the next few days to have a repeat blood pressure     check.  At that time, it can be reassessed if the metoprolol  needs     to be restarted or not.  Currently his blood pressures is ranging     in 120s. 2. Acute renal failure.  This was also volume related and is currently     back to normal range with hydration. 3. Chronic systolic congestive heart failure.  This has remained     compensated through his hospitalization. 4. Black stools.  The patient describes having black stools.  He also     has a history of NSAID use.  On further questioning, it is found     out that he was started on iron during his last admission, and that the dark stools had started since then.  His     stools were checked for occult blood and was found be negative and     hemoglobin has been stable.  He was felt appropriate discharge to     his primary care physician for further management as necessary. 5. The remainder the patient's medical issues have been stable.  The patient has not had any signs of infection.  He has not had any arrhythmias on telemetry and  currently he feels ready to be discharged and is asking to be discharged home.  CONSULTATIONS:  None.  DIAGNOSTIC IMAGING:  Chest x-ray on admission shows no infiltrate, congestive heart failure, or pneumothorax.  DISCHARGE INSTRUCTIONS:  The patient should continue heart-healthy diet and conduct his activity as tolerated.  He is advised to keep himself hydrated.  He will need to follow up with his primary care physician. His wife said that she will schedule appointment in the next 2 days to recheck blood pressure and if this is starting to trend up, the metoprolol may be reinstituted.  He will follow up with cardiology service and his wife will schedule that appointment.  He will also follow with Dr. [name] as previously scheduled.  CONDITION AT TIME OF DISCHARGE:  Improved.     Erick Blinks, MD     JM/MEDQ  D:  09/12/2010  T:  09/12/2010  Job:  [id number]  cc:   Dr. Arlana Pouch  Electronically Signed by Durward Mallard Eldena Dede  on 09/19/2010 04:27:04 PM

## 2010-09-19 NOTE — H&P (Signed)
NAME:  Mark Dougherty, Mark Dougherty NO.:  [id number]  MEDICAL RECORD NO.:  [id number]           PATIENT TYPE:  E  LOCATION:  WLED                         FACILITY:  [facility]  PHYSICIAN:  Erick Blinks, MD     DATE OF BIRTH:  1926-12-29  DATE OF ADMISSION:  09/11/2010 DATE OF DISCHARGE:                             HISTORY & PHYSICAL   PRIMARY CARE PHYSICIAN:  Dr. Arlana Pouch in [place], [place].  CARDIOLOGIST:  Antonieta Iba, MD, [place].  CHIEF COMPLAINT:  Dizziness and presyncope.  HISTORY OF PRESENT ILLNESS:  This is an 77 year old gentleman who has a history of hypertension, coronary artery disease, hyperlipidemia, chronic systolic congestive heart failure and spinal stenosis status post recent lumbar laminectomy.  The patient was recently discharged from the hospital on Sep 04, 2010, after undergoing a lumbar laminectomy. He and his wife reports that he had been initially doing fairly well at home and had noticed significant improvement in the strength in his leg after the surgery.  For the past few days, he has been increasingly lethargic and sleepy, sleeping approximately 12 hours during the day. His p.o. intake has been minimal.  On the day of admission, he noted that he tried to get up out of bed and when he stood up he was increasingly dizzy and fell back into bed.  He again tried to get up and began moving around with his walker.  At one point he felt that he was going to blackout and his wife did catch him before he fell.  He subsequently went to his followup appointment with Dr. [name] and during that visit it was noted that he was hypotensive.  He was subsequently referred to the emergency room for evaluation.  While here in the emergency room, the patient was found be hypotensive with blood pressure in the 70s and this has responded to IV fluids.  After receiving some IV fluids, he feels significantly better, although on repeat orthostatic checks he is  increasingly weak and dizzy and tachycardic.  The hospitalist service has been asked to admit for further treatment.  The patient at home has been taking his muscle relaxant on a scheduled basis which may have been added to his somnolence.  He denies any fever, dysuria, cough, chest pain, shortness of breath, abdominal pain.  He has had some constipation but describes having dark stools when he moves his bowels.  Denies any history of NSAIDs or or vomiting or hematemesis.  He has not had any changes in vision, headache, unilateral weakness or numbness.  He denies any dysuria.  He will be admitted for further workup of his hypotension.  PAST MEDICAL HISTORY: 1. Coronary artery disease, status post CABG. 2. Chronic systolic congestive heart failure with ejection fraction     40%. 3. Hypertension. 4. Hyperlipidemia. 5. Carotid artery stenosis status post left carotid endarterectomy. 6. Spinal stenosis status post lumbar laminectomy. 7. Benign prostatic hypertrophy. 8. Non-insulin dependent diabetes.  ALLERGIES:  No known drug allergies.  MEDICATIONS:  Prior to admission: 1. Amiodarone 200 mg p.o. b.i.d. 2. Metoprolol XL succinate 25 mg twice daily. 3. Robaxin  500 mg p.o. every 8 hours as needed. 4. Digoxin 0.25 mg 1 tablet daily. 5. Aspirin enteric coated 81 mg 1 tablet daily. 6. Hydrocodone/APAP 5/325 mg 1 to 2 tablets every 4 hours as needed. 7. Folic acid 1 mg 1 tablet daily. 8. Ferrous sulfate 325 mg 1 tablet twice daily. 9. Simvastatin 40 mg 1 tablet daily at bedtime. 10.Terazosin 1 mg 1 capsule daily at bedtime. 11.Lisinopril 2.5 mg every morning. 12.Metformin 500 mg 1 tablet in the morning and 2 tablets in the     evening. 13.Glipizide 5 mg 1 tablet daily. 14.Vitamin B12 250 mcg p.o. 1 tablet daily.  SOCIAL HISTORY:  The patient is a nonsmoker.  He does not drink any alcohol.  Denies any illicit drug use.  He lives with his wife and ambulates with a aid of a walker  currently  FAMILY HISTORY:  Noncontributory.  REVIEW OF SYSTEMS:  All systems have been reviewed and pertinent positives stated in the HPI.  PHYSICAL EXAMINATION:  VITAL SIGNS:  Temperature 97.6, respiratory rate of 21, heart rate of 80, blood pressure 108/47 and was as low as systolic blood pressure in the 70s. GENERAL:  The patient in no acute distress, lying comfortably in bed. HEENT:  Normocephalic, atraumatic.  Pupils are equal, round, reactive to light.  Mucous membranes are somewhat dry. NECK:  Supple. CHEST:  Clear to auscultation bilaterally. CARDIAC EXAM:  Shows S1 and S2 with regular rate and rhythm. ABDOMEN:  Soft, nontender.  Bowel sounds are active. EXTREMITIES:  Showed no signs of cyanosis, clubbing or edema. NEUROLOGICAL:  Neurologically the patient has equal strength bilaterally.  Cranial nerves II through XII appear grossly intact.  LABORATORY DATA:  WBC 11.7, hemoglobin 11.6, platelet count 513.  Sodium 133, potassium 4.4, chloride 99, bicarb 24, BUN 25, creatinine 1.69, calcium 9.4, glucose 196.  Liver functions within normal limits.  INR 1.12.  Cardiac enzymes point-of-care negative.  EKG shows sinus rhythm with PACs at a rate of 88 without any acute ST-T changes.  Chest x-ray does not show any acute disease.  ASSESSMENT/PLAN: 1. Hypotension.  This is likely secondary to volume depletion and     dehydration.  The patient was likely increasing lethargic from pain     medication/muscle relaxant he was taking at home.  This had impeded     his p.o. intake, also coupled with the patient's antihypertensive     he was taking.  This is causing him to be significantly     hypotensive.  He has responded to volume repletion here in the     emergency room.  We will continue with IV fluids.  We will admit     him to a telemetry bed and cycle his cardiac enzymes to rule out     any underlying cardiac event.  We will also check a urinalysis as     well as UA to rule out  any urinary tract infection.  The patient     does report having black stools, so we will check stools for     Hemoccult as well, although this may have influenced since the fact     that he was taking iron supplements.  We will hold his metoprolol     and lisinopril for now as well as his digoxin since he does have     some renal failure and we will continue his amiodarone, it may be     restarted as the patient's blood pressure tolerates. 2. Dehydration.  We will rehydrate him with normal saline.  We will     have to do it cautiously as the patient does have a history of     congestive heart failure. 3. Acute renal failure.  We will recheck labs in the morning.  If     there is no significant improvement, then further workup will be     pursued at that time. 4. Non-insulin dependent diabetes.  We will hold his hypoglycemics     right now and continue him on sliding scale insulin.  These may be     restarted once the patient is discharged and we will also check an     A1c. 5. Code status.  The patient is a full code.  Further orders will per     the clinical course     Erick Blinks, MD     JM/MEDQ  D:  09/11/2010  T:  09/11/2010  Job:  [id number]  Electronically Signed by Durward Mallard Mcarthur Ivins  on 09/19/2010 04:34:09 PM

## 2010-09-20 NOTE — Consult Note (Signed)
NAME:  Mark Dougherty, Mark Dougherty NO.:  [id number]  MEDICAL RECORD NO.:  [id number]           PATIENT TYPE:  I  LOCATION:  5025                         FACILITY:  [facility]  PHYSICIAN:  Bevelyn Buckles. Arlethia Basso, MDDATE OF BIRTH:  [birth year]  DATE OF CONSULTATION:  08/29/2010 DATE OF DISCHARGE:                                CONSULTATION   PRIMARY CARDIOLOGIST:  Antonieta Iba, MD  REQUESTING PHYSICIAN:  Kerrin Champagne, MD  REASON FOR CONSULTATION:  Postop tachycardia.  HISTORY OF PRESENT ILLNESS:  Mark Dougherty is a delightful 77 year old male with a history of coronary artery disease, status post bypass surgery in 1996 in [place], also has a history of hypertension, peripheral vascular disease, status post left carotid endarterectomy in 2000, diabetes.  He was admitted for lumbar laminectomy due to spinal stenosis.  From a cardiac standpoint, he was doing very well preoperatively.  He had not had any problems with his heart.  He has not had any subsequent catheterizations since 1996.  He has remained fairly functional as permitted by his back.  Yesterday, he underwent successful three-level lumbar laminectomy without significant complication.  Tonight, he was noted to be tachycardic with heart rates in the 120s.  He denied any associated chest pain or shortness of breath.  His blood pressure was stable, systolics in the 130-140 range.  I was called to come see him.  He denies any history of atrial arrhythmias.  Currently, he is quite comfortable except for incisional pain in his back.  Denies any significant heart failure symptoms preoperatively.  No fevers or chills.  No nausea or vomiting.  The remainder of review of systems is negative except for HPI and problem list.  PROBLEM LIST: 1. Coronary artery disease.     a.     Status post bypass surgery in 1996. 2. Mild left ventricular dysfunction.     a.     Echocardiogram in March 2011 by Dr. Ann Maki shows an      ejection  fraction of 40% with mild MR and TR. 3. Hypertension. 4. Hyperlipidemia. 5. Carotid artery stenosis.     a.     Status post left carotid endarterectomy. 6. Spinal stenosis.     a.     Status post three-level lumbar laminectomy on August 28, 2010. 7. Benign prostatic hypertrophy.  HOME MEDICINES: 1. Terazosin 1 mg a day. 2. Simvastatin 40 mg a day. 3. Digoxin 0.25 a day. 4. Atenolol 25 mg a day. 5. Folic acid 1 mg a day. 6. Vitamin B12. 7. Aspirin 81 a day. 8. Aleve p.r.n. 9. Lisinopril 2.5 mg a day. 10.Metformin 500 mg b.i.d. 11.Glipizide 5 mg a day.  ALLERGIES:  No known allergies.  SOCIAL HISTORY:  He is married.  He is retired.  Denies any tobacco or significant alcohol use currently.  FAMILY HISTORY:  Noncontributory.  PHYSICAL EXAMINATION:  GENERAL:  He is an elderly male, lying on a side due to some back pain, otherwise in no acute distress. VITAL SIGNS:  Blood pressure is 140/61, heart rate is 120. HEENT:  Normal. NECK:  Supple.  No obvious JVD, but it sort of hard to see due to this positioning.  Carotids are 1+ bilaterally.  He has left carotid endarterectomy scar, I do not hear any obvious bruits.  There is no thyromegaly. CARDIAC:  PMI is laterally displaced.  He is tachycardic and regular. No obvious murmurs. LUNGS:  Clear.  He has surgical scar on his back. ABDOMEN:  Obese, soft, nontender, nondistended.  Hypoactive bowel sounds.  Nontender. EXTREMITIES:  Warm.  No cyanosis, clubbing or edema. NEUROLOGIC:  Alert and oriented x3.  Cranial nerves are grossly intact. PSYCHIATRY:  Affect is pleasant.  EKG shows what appears to be atrial flutter versus atrial tach at a rate of 120.  There is PVC.  Sodium 133, potassium 5.1, BUN 19, creatinine 1.11.  Hemoglobin is 10.4, hematocrit is 31.2 this is down from 13.0 and 39.0 preoperatively.  ASSESSMENT: 1. Postop atrial flutter/atrial tachycardia with rapid ventricular     response. 2. Coronary artery  disease, status post bypass surgery in 1996. 3. History of left ventricular dysfunction with an ejection fraction     of 40% by echocardiogram in March 2011. 4. Status post lumbar laminectomy on August 28, 2010.  PLAN/DISCUSSION:  Mark Dougherty appears to have postop atrial flutter/atrial tachycardia.  Given the recent onset and his inability to be anticoagulated with his surgery, we will start IV amiodarone to try to slow his rate and convert him quickly back to sinus rhythm.  We will cycle cardiac markers.  We will check a digoxin level as well.  We will follow him closely.  I would avoid aggressive IV hydration, so we do not put him into heart failure.  We appreciate the consult.  We will follow with you.     Bevelyn Buckles. Jalia Zuniga, MD     DRB/MEDQ  D:  08/29/2010  T:  08/29/2010  Job:  [id number]  cc:   Antonieta Iba, MD Kerrin Champagne, M.D.  Electronically Signed by Arvilla Meres MD on 09/20/2010 09:37:49 PM

## 2010-09-28 ENCOUNTER — Encounter: Payer: Self-pay | Admitting: Cardiovascular Disease

## 2010-09-28 ENCOUNTER — Ambulatory Visit (INDEPENDENT_AMBULATORY_CARE_PROVIDER_SITE_OTHER): Payer: Medicare Other | Admitting: Cardiovascular Disease

## 2010-09-28 DIAGNOSIS — E785 Hyperlipidemia, unspecified: Secondary | ICD-10-CM

## 2010-09-28 DIAGNOSIS — E860 Dehydration: Secondary | ICD-10-CM

## 2010-09-28 DIAGNOSIS — I6529 Occlusion and stenosis of unspecified carotid artery: Secondary | ICD-10-CM

## 2010-09-28 DIAGNOSIS — I420 Dilated cardiomyopathy: Secondary | ICD-10-CM | POA: Insufficient documentation

## 2010-09-28 DIAGNOSIS — I428 Other cardiomyopathies: Secondary | ICD-10-CM

## 2010-09-28 DIAGNOSIS — I2510 Atherosclerotic heart disease of native coronary artery without angina pectoris: Secondary | ICD-10-CM

## 2010-09-28 DIAGNOSIS — E119 Type 2 diabetes mellitus without complications: Secondary | ICD-10-CM

## 2010-09-28 MED ORDER — CARVEDILOL 3.125 MG PO TABS: 3.1250 mg | ORAL_TABLET | Freq: Two times a day (BID) | ORAL | Status: DC

## 2010-09-28 NOTE — Assessment & Plan Note (Signed)
Cholesterol is at goal on the current lipid regimen. No changes to the medications were made.  

## 2010-09-28 NOTE — Assessment & Plan Note (Signed)
We have encouraged continued exercise, careful diet management in an effort to lose weight. Continue his current medications. Followup with Dr. Arlana Pouch.

## 2010-09-28 NOTE — Progress Notes (Signed)
   Patient ID: Mark Dougherty, male    DOB: [birth year], 77 y.o.   MRN: [id number]  HPI Comments: 77 year old with history of coronary artery disease, bypass surgery in 1996 in [place], peripheral vascular disease with CEA in 2000  on the left, long history of diabetes, recent worsening back pain with MRI showing spinal stenosis, hyperlipidemia, with recent back surgery with complications including atrial fibrillation in the perioperative, postoperative period started on amiodarone with conversion to sinus rhythm, with a repeat admission to [facility] for dehydration, hypotension and malaise.   On his last clinic visit 2 weeks ago, he appeared hypotensive, dehydrated with malaise and difficulty standing. We held his lisinopril, encourage p.o. Fluids. He presents today and reports that he feels much better. He has been using his walker and states that his legs are getting stronger. He has been recording his blood pressure and heart rate and these have also improved. Systolic pressures typically in the 120 to 1:30 range, heart rate in the 70s to 90 range.  Notes indicate an ejection fraction of 40% in March of 2011 by echocardiogram.  MRI of his back shows severe spinal stenosis at L2 and L3, other foraminal stenoses of the lumbar region.  EKG shows sinus tachycardia with rate 95 beats per minute with nonspecific ST changes, left axis deviation/left anterior fascicular block      Review of Systems  Constitutional: Positive for fatigue.  HENT: Negative.   Eyes: Negative.   Respiratory: Negative.   Cardiovascular: Negative.   Gastrointestinal: Negative.   Musculoskeletal: Positive for gait problem.  Skin: Negative.   Neurological: Negative.   Hematological: Negative.   Psychiatric/Behavioral: Negative.   All other systems reviewed and are negative.    BP 110/72  Pulse 80  Ht 5\' 11"  (1.803 m)  Wt 203 lb (92.08 kg)  BMI 28.31 kg/m2   Physical Exam  Nursing note and vitals  reviewed. Constitutional: He is oriented to person, place, and time. He appears well-developed and well-nourished.  HENT:  Head: Normocephalic.  Nose: Nose normal.  Mouth/Throat: Oropharynx is clear and moist.  Eyes: Conjunctivae are normal. Pupils are equal, round, and reactive to light.  Neck: Normal range of motion. Neck supple. No JVD present.  Cardiovascular: Normal rate, regular rhythm, S1 normal, S2 normal and intact distal pulses.  Exam reveals no gallop and no friction rub.   Murmur heard.  Crescendo systolic murmur is present with a grade of 2/6  Pulmonary/Chest: Effort normal and breath sounds normal. No respiratory distress. He has no wheezes. He has no rales. He exhibits no tenderness.  Abdominal: Soft. Bowel sounds are normal. He exhibits no distension. There is no tenderness.  Musculoskeletal: Normal range of motion. He exhibits no edema and no tenderness.  Lymphadenopathy:    He has no cervical adenopathy.  Neurological: He is alert and oriented to person, place, and time. Coordination normal.  Skin: Skin is warm and dry. No rash noted. No erythema.  Psychiatric: He has a normal mood and affect. His behavior is normal. Judgment and thought content normal.           Assessment and Plan

## 2010-09-28 NOTE — Assessment & Plan Note (Signed)
He appears much better than his last visit 2 weeks ago. Blood pressure is stable, he does not appear dehydrated.

## 2010-09-28 NOTE — Patient Instructions (Signed)
You are doing well. Please start coreg 3.125 in the Am and PM. Please call us if you have new issues that need to be addressed before your next appt.  We will call you for a follow up Appt. In 3 months

## 2010-09-28 NOTE — Assessment & Plan Note (Signed)
Previous ejection fraction estimated at 40%. We will start low-dose Coreg 3.125 mg b.i.d. We'll continue to hold his lisinopril for now and will add this slowly as tolerated in several weeks' time.

## 2010-09-28 NOTE — Assessment & Plan Note (Signed)
Currently with no symptoms of angina. No further workup at this time. Continue current medication regimen. 

## 2010-09-29 ENCOUNTER — Other Ambulatory Visit: Payer: Self-pay | Admitting: Emergency Medicine

## 2010-09-29 MED ORDER — AMIODARONE HCL 200 MG PO TABS: 200.0000 mg | ORAL_TABLET | Freq: Two times a day (BID) | ORAL | Status: DC

## 2010-10-06 NOTE — Discharge Summary (Signed)
Mark Dougherty, Mark Dougherty NO.:  [id number]  MEDICAL RECORD NO.:  [id number]           PATIENT TYPE:  I  LOCATION:  3731                         FACILITY:  [facility]  PHYSICIAN:  Kerrin Champagne, M.D.   DATE OF BIRTH:  1926-06-15  DATE OF ADMISSION:  08/28/2010 DATE OF DISCHARGE:  09/04/2010                              DISCHARGE SUMMARY   ADMISSION DIAGNOSES: 1. Lumbar spinal stenosis L2-3 and L4-5 centrally with foraminal     narrowing at both levels.  Grade 1 spondylolisthesis at L4-5.  Left-     sided lateral recess stenosis at L5-S1 with previous left L5-S1     hemilaminectomy. 2. Coronary artery disease status post coronary artery bypass graft in     1996. 3. History of the left ventricular dysfunction with ejection fraction     40% by echocardiogram in March 2011. 4. Benign prostatic hypertrophy. 5. Status post left carotid endarterectomy for carotid artery     stenosis. 6. Hyperlipidemia. 7. Hypertension.  DISCHARGE DIAGNOSES: 1. Lumbar spinal stenosis L2-3 and L4-5 centrally with foraminal     narrowing at both levels.  Grade 1 spondylolisthesis at L4-5.  Left-     sided lateral recess stenosis at L5-S1 with previous left L5-S1     hemilaminectomy. 2. Coronary artery disease status post coronary artery bypass graft in     1996. 3. History of the left ventricular dysfunction with ejection fraction     40% by echocardiogram in March 2011. 4. Benign prostatic hypertrophy. 5. Status post left carotid endarterectomy for carotid artery     stenosis. 6. Hyperlipidemia. 7. Hypertension. 8. Postoperative atrial flutter/atrial tachycardia with rapid     ventricular response. 9. Acute blood loss anemia. 10.Urinary retention. 11.Postoperative constipation, resolved at discharge.  PROCEDURE:  Central laminectomy L2-3, redo central laminectomy L4-5 with bilateral L4 and bilateral L5 nerve root compression.  Left L5-S1 redo partial hemilaminectomy for lateral  recess decompression.  Decompression of bilateral L3, bilateral L4, bilateral L2, and bilateral L5 nerve roots with left-sided S1 nerve root decompression.  This was performed by Dr. [name], assisted by Maud Deed, PA-C, under general anesthesia.  CONSULTATIONS:  Dr. Gala Romney for [facility].  BRIEF HISTORY:  The patient is an 77 year old male with long history of spinal stenosis having had multiple epidural steroid injections for pain control.  He has had chronic and progressive neurogenic claudication. He is now unable to stand and ambulate more than 100 feet.  Epidural steroid injections were no longer giving him relief of his symptoms. MRI study showed evidence of central stenosis at L2-3 and L4-5 with lateral recess stenosis left L5 and left S1.  As conservative treatment has no longer been effective, it was felt he would benefit from surgical intervention and was admitted for the procedure as stated above.  BRIEF HOSPITAL COURSE:  The patient tolerated the procedure under general anesthesia without complications.  Postoperatively, neurovascular motor function of the lower extremities was noted to be intact.  Hemovac drain was changed on the first postoperative day without difficulty.  The patient did have nausea  and vomiting on the first postoperative morning.  He also had difficulty with voiding once his Foley catheter was discontinued.  The patient's Foley catheter did require repeat insertion.  His diet was held for ileus precautions.  He was started on physical therapy for ambulation and gait training and a corset LSO was provided for the patient.  The patient developed tachycardia through the evening on the first postoperative day. Cardiology consult was obtained through Dr. Gala Romney.  The patient was placed on the telemetry unit.  As he was not a candidate for anticoagulation, he was placed on amiodarone IV and  gradually weaned to p.o. amiodarone and eventually sent home with this medication as well. His atenolol was changed to Toprol-XL.Marland Kitchen  Echocardiogram was rechecked during the hospital stay.  Serial cardiac enzymes were found to be within normal limits.  The echo showed moderately calcified aorta with mild stenosis.  The patient had several episodes of confusion through the hospital stay which was felt to be related to his narcotic analgesics.  The narcotics were decreased significantly as his pain was better controlled.  His mental status changes did improve with this.  He was started on physical therapy for ambulation and gait training and initially was quite slow for advancement.  It was felt he might require inpatient rehabilitation but gradually did progress with ambulation in the hallway.  He continued to have poor bowel function and through the hospital stay was continued on clear liquids and IV fluids until his bowel function returned.  He developed acute blood loss anemia and was treated with iron supplementation.  Eventually, he was started on Lovenox for anticoagulation.  As he progressed during the hospital stay, eventually he was felt to be stable for discharge to his home with arrangements made for home health physical therapy and occupational therapy.  At the time of discharge, he was afebrile, vital signs were stable.  He did have active bowel sounds and was taking a regular diet. His wound was healing without signs or symptoms of infection. Hemoglobin and hematocrit at discharge 9.1 and 27.5.  White blood cell count at discharge 8.2.  The patient did have hyponatremia through much of the hospital stay but at discharge sodium was normal at 136.  BUN and creatinine at last check on Sep 02, 2010, was 25 and 1.16.  Other chemistries included abnormal albumin at 2.1, total protein at 5.7, and indirect bilirubin 0.2.  Hemoglobin A1c was 7.6.  TSH level of 2.159. Digoxin level 0.9.   Urinalysis on admission was without urinary tract infection.  PLAN:  The patient was discharged to his home.  Arrangements were made for home health physical therapy and occupational therapy.  He will change his dressing daily or as needed.  He will wear his corset brace when he is out of bed for ambulation.  He will avoid bending, lifting, or twisting.  He has scheduled follow up with Dr. [name] 1 week from the date of surgery.  He should also follow up with cardiologist as indicated.  MEDICATIONS AT DISCHARGE:  Prescriptions for amiodarone 200 mg daily for 4 weeks, digoxin 0.125 mg daily, metoprolol 25 mg b.i.d., and baby aspirin daily.  The patient will use hydrocodone at home for his discomfort. He and his wife were advised to call the office should he have questions or concerns prior to his return office visit.  He will resume all other medications as taken prior to admission with the exception of atenolol.  CONDITION ON DISCHARGE:  Stable.     Wende Neighbors, P.A.   ______________________________ Kerrin Champagne, M.D.    SMV/MEDQ  D:  09/27/2010  T:  09/28/2010  Job:  [id number]  Electronically Signed by Dorna Mai. on 09/29/2010 11:24:18 AM Electronically Signed by Vira Browns M.D. on 10/06/2010 07:46:52 PM

## 2010-10-27 ENCOUNTER — Telehealth: Payer: Self-pay | Admitting: *Deleted

## 2010-10-27 NOTE — Telephone Encounter (Signed)
Pt called this AM wanting to let Dr. Mariah Milling know that for about 3 days he has had ankle swelling in right ankle only, throughout day, and in AM when he wakes up the swelling is completely resolved. Pt has no other complaints, he states he is staying hydrated as instructed at last visit, he does not take any fluid pill. He said he is fine with whatever Dr. Mariah Milling recommends, he does not necessarily need to be seen, he was told to call if any swelling occurred.

## 2010-10-27 NOTE — Telephone Encounter (Signed)
Would probably monitor closely for now. If swelling gets worse, cut back on fluids. Call the office if getting worse

## 2010-10-31 NOTE — Telephone Encounter (Signed)
Pt came in this AM, Dr. Mariah Milling spoke with pt in office. Explained to pt the possibility of leaky valves in veins in legs could cause swelling. Pt has had a problem with dehydration previously, he does not drink much fluid, and discussed with pt fluid pill is probably not best option, and may dry him out more, and will not help if this is from vein pulling. Pt understands this. Pt will monitor swelling, he does state it is gone when he wakes up, and as day progresses, the swelling increases. He also has some numbness in right foot only, pt had vascular study by Dr. Earnestine Leys 2 years prior that was normal. Pt was notified that the numbness could also be nerve related, and pt may need to orthopedics in future if this continues or worsens. Pt will call back with any changes.

## 2010-10-31 NOTE — Telephone Encounter (Signed)
Attempted to contact pt, LMOM TCB.  

## 2010-12-29 ENCOUNTER — Encounter: Payer: Self-pay | Admitting: Cardiovascular Disease

## 2011-01-02 ENCOUNTER — Other Ambulatory Visit: Payer: Medicare Other | Admitting: Diagnostic Neuroimaging

## 2011-01-02 ENCOUNTER — Ambulatory Visit: Payer: Medicare Other | Admitting: Cardiovascular Disease

## 2011-01-04 ENCOUNTER — Encounter: Payer: Self-pay | Admitting: Cardiovascular Disease

## 2011-01-04 ENCOUNTER — Ambulatory Visit: Payer: Medicare Other | Admitting: Diagnostic Neuroimaging

## 2011-01-04 ENCOUNTER — Ambulatory Visit (INDEPENDENT_AMBULATORY_CARE_PROVIDER_SITE_OTHER): Payer: Medicare Other | Admitting: Cardiovascular Disease

## 2011-01-04 VITALS — BP 139/81 | HR 71 | Ht 71.5 in | Wt 213.0 lb

## 2011-01-04 DIAGNOSIS — I6529 Occlusion and stenosis of unspecified carotid artery: Secondary | ICD-10-CM

## 2011-01-04 DIAGNOSIS — I480 Paroxysmal atrial fibrillation: Secondary | ICD-10-CM | POA: Insufficient documentation

## 2011-01-04 DIAGNOSIS — E785 Hyperlipidemia, unspecified: Secondary | ICD-10-CM

## 2011-01-04 DIAGNOSIS — I2510 Atherosclerotic heart disease of native coronary artery without angina pectoris: Secondary | ICD-10-CM

## 2011-01-04 DIAGNOSIS — I428 Other cardiomyopathies: Secondary | ICD-10-CM

## 2011-01-04 DIAGNOSIS — E119 Type 2 diabetes mellitus without complications: Secondary | ICD-10-CM

## 2011-01-04 DIAGNOSIS — I4891 Unspecified atrial fibrillation: Secondary | ICD-10-CM

## 2011-01-04 MED ORDER — AMIODARONE HCL 200 MG PO TABS: 200.0000 mg | ORAL_TABLET | Freq: Every day | ORAL | Status: DC

## 2011-01-04 MED ORDER — CARVEDILOL 6.25 MG PO TABS: 6.2500 mg | ORAL_TABLET | Freq: Two times a day (BID) | ORAL | Status: DC

## 2011-01-04 NOTE — Assessment & Plan Note (Signed)
Currently with no symptoms of angina. No further workup at this time.   

## 2011-01-04 NOTE — Assessment & Plan Note (Signed)
He reports sugars have been much better since his recent weight loss over the past year.

## 2011-01-04 NOTE — Assessment & Plan Note (Signed)
We will increase his beta blocker, decrease the amiodarone. We've asked him to monitor his blood pressure for the next several weeks including his heart rate. Ideally, we would like to restart low dose lisinopril.

## 2011-01-04 NOTE — Assessment & Plan Note (Signed)
Would continue simvastatin. Goal LDL less than 70.

## 2011-01-04 NOTE — Patient Instructions (Signed)
You are doing well. Please decrease the amiodarone to one pill a day (Am) Please increase the coreg/carvedilol to 6.25 mg twice a day (2 of the 3 mg pills) Please call with blood pressure numbers so we can start lisinopril Please call us if you have new issues that need to be addressed before your next appt.  We will call you for a follow up Appt. In 6 months

## 2011-01-04 NOTE — Assessment & Plan Note (Signed)
He is maintaining normal sinus rhythm. We will decrease the amiodarone to 200 mg daily. We'll increase his Coreg to 6.25 mg b.i.d..

## 2011-01-04 NOTE — Assessment & Plan Note (Addendum)
We have recommended continued aggressive cholesterol management. We will discuss with him when his last carotid ultrasound is. He should have this annually.

## 2011-01-04 NOTE — Progress Notes (Signed)
Patient ID: Mark Dougherty, male    DOB: 1927/02/12, 77 y.o.   MRN: [id number]  HPI Comments: 77 year old with history of coronary artery disease, bypass surgery in 1996 in [place], peripheral vascular disease with CEA in 2000  on the left, long history of diabetes, recent worsening back pain with MRI showing spinal stenosis, hyperlipidemia, with recent back surgery with complications including atrial fibrillation in the perioperative, postoperative period started on amiodarone with conversion to sinus rhythm, with a repeat admission to [facility] for dehydration, hypotension and malaise. He presents for routine followup.  Earlier this year, he was hypotensive, had malaise and difficulty standing. We held his lisinopril, encourage p.o. Fluids. He felt better in follow up.  much better.  We restarted Coreg on his last clinic visit secondary to elevated heart rate and improved blood pressure. Overall he feels well with no complaints. He does fall to the left and believes he has a neurologic issue, not a leg issue. He has been told by physical therapy his legs are strong. He is scheduled for an MRI of the head today with followup with neurology in [place].  Notes indicate an ejection fraction of 40% in March of 2011 by echocardiogram.  MRI of his back shows severe spinal stenosis at L2 and L3, other foraminal stenoses of the lumbar region.  EKG shows sinus Rhythm with first degree AV block with rate 68 beats per minute with nonspecific ST changes, left anterior fascicular block    Outpatient Encounter Prescriptions as of 01/04/2011  Medication Sig Dispense Refill  . aspirin 81 MG EC tablet Take 81 mg by mouth daily.        . folic acid (FOLVITE) 1 MG tablet Take 1 mg by mouth daily.        Marland Kitchen glipiZIDE (GLUCOTROL) 5 MG tablet Take 5 mg by mouth 1 dose over 24 hours.       . metFORMIN (GLUCOPHAGE) 500 MG tablet Take 500 mg by mouth 2 (two) times daily with a meal.        . polyethylene glycol  (MIRALAX / GLYCOLAX) packet Take 17 g by mouth daily.        . simvastatin (ZOCOR) 20 MG tablet Take 20 mg by mouth at bedtime.        Marland Kitchen terazosin (HYTRIN) 1 MG capsule Take 1 mg by mouth at bedtime.        . vitamin B-12 (CYANOCOBALAMIN) 250 MCG tablet Take 250 mcg by mouth daily.        Marland Kitchen  amiodarone (PACERONE) 200 MG tablet Take 1 tablet (200 mg total) by mouth 2 (two) times daily.  60 tablet  6  .  carvedilol (COREG) 3.125 MG tablet Take 1 tablet (3.125 mg total) by mouth 2 (two) times daily.  60 tablet  6     Review of Systems  HENT: Negative.   Eyes: Negative.   Respiratory: Negative.   Cardiovascular: Negative.   Gastrointestinal: Negative.   Musculoskeletal: Positive for gait problem.  Skin: Negative.   Neurological: Negative.   Hematological: Negative.   Psychiatric/Behavioral: Negative.   All other systems reviewed and are negative.    BP 139/81  Pulse 71  Ht 5' 11.5" (1.816 m)  Wt 213 lb (96.616 kg)  BMI 29.29 kg/m2   Physical Exam  Nursing note and vitals reviewed. Constitutional: He is oriented to person, place, and time. He appears well-developed and well-nourished.  HENT:  Head: Normocephalic.  Nose: Nose normal.  Mouth/Throat:  Oropharynx is clear and moist.  Eyes: Conjunctivae are normal. Pupils are equal, round, and reactive to light.  Neck: Normal range of motion. Neck supple. No JVD present.  Cardiovascular: Normal rate, regular rhythm, S1 normal, S2 normal and intact distal pulses.  Exam reveals no gallop and no friction rub.   Murmur heard.  Crescendo systolic murmur is present with a grade of 2/6  Pulmonary/Chest: Effort normal and breath sounds normal. No respiratory distress. He has no wheezes. He has no rales. He exhibits no tenderness.  Abdominal: Soft. Bowel sounds are normal. He exhibits no distension. There is no tenderness.  Musculoskeletal: Normal range of motion. He exhibits no edema and no tenderness.  Lymphadenopathy:    He has no  cervical adenopathy.  Neurological: He is alert and oriented to person, place, and time. Coordination normal.  Skin: Skin is warm and dry. No rash noted. No erythema.  Psychiatric: He has a normal mood and affect. His behavior is normal. Judgment and thought content normal.           Assessment and Plan

## 2011-05-01 HISTORY — PX: EYE SURGERY: SHX253

## 2011-07-05 ENCOUNTER — Encounter: Payer: Self-pay | Admitting: Cardiovascular Disease

## 2011-07-05 ENCOUNTER — Ambulatory Visit (INDEPENDENT_AMBULATORY_CARE_PROVIDER_SITE_OTHER): Payer: Medicare Other | Admitting: Cardiovascular Disease

## 2011-07-05 VITALS — BP 114/74 | HR 71 | Ht 71.5 in | Wt 223.0 lb

## 2011-07-05 DIAGNOSIS — I428 Other cardiomyopathies: Secondary | ICD-10-CM

## 2011-07-05 DIAGNOSIS — I2510 Atherosclerotic heart disease of native coronary artery without angina pectoris: Secondary | ICD-10-CM

## 2011-07-05 DIAGNOSIS — E119 Type 2 diabetes mellitus without complications: Secondary | ICD-10-CM

## 2011-07-05 DIAGNOSIS — I6529 Occlusion and stenosis of unspecified carotid artery: Secondary | ICD-10-CM

## 2011-07-05 DIAGNOSIS — I4891 Unspecified atrial fibrillation: Secondary | ICD-10-CM

## 2011-07-05 NOTE — Assessment & Plan Note (Signed)
Ejection fraction 40% in 2011. Family did not want additional medications including ACE inhibitors given chronic dizziness.

## 2011-07-05 NOTE — Assessment & Plan Note (Signed)
Continue aggressive cholesterol management.  Previously seen by Dr. Earnestine Leys. We will set him up for carotid ultrasound in our office later this year.

## 2011-07-05 NOTE — Progress Notes (Signed)
Patient ID: Mark Dougherty, male    DOB: Sep 13, 1926, 78 y.o.   MRN: [id number]  HPI Comments: 78 year old with history of coronary artery disease, bypass surgery in 1996 in [place], peripheral vascular disease with CEA in 2000  on the left, long history of diabetes, recent worsening back pain with MRI showing spinal stenosis, hyperlipidemia, with recent back surgery with complications including atrial fibrillation in the perioperative, postoperative period started on amiodarone with conversion to sinus rhythm, with a repeat admission to [facility] for dehydration, hypotension and malaise. He presents for routine followup.  Earlier this year, he was hypotensive, had malaise and difficulty standing. We held his lisinopril, encourage p.o. Fluids. He felt better in follow up.  much better. We restarted Coreg.   Overall he feels well with no complaints. He does fall to the left and believes he has a neurologic issue. He has been told by physical therapy his legs are strong. He has had followup with neurology with no clear explanation. He is able to walk with a walker with no gait instability. He does have instability when he does not have his walker.  Notes indicate an ejection fraction of 40% in March of 2011 by echocardiogram.  MRI of his back shows severe spinal stenosis at L2 and L3, other foraminal stenoses of the lumbar region.  EKG shows sinus Rhythm with first degree AV block with rate 66 beats per minute, consider old anterior MI, left axis deviation   Outpatient Encounter Prescriptions as of 07/05/2011  Medication Sig Dispense Refill  . amiodarone (PACERONE) 200 MG tablet Take 1 tablet (200 mg total) by mouth daily.  90 tablet  4  . aspirin 81 MG EC tablet Take 81 mg by mouth daily.        . carvedilol (COREG) 6.25 MG tablet Take 1 tablet (6.25 mg total) by mouth 2 (two) times daily.  180 tablet  4  . folic acid (FOLVITE) 1 MG tablet Take 1 mg by mouth daily.        Marland Kitchen glipiZIDE  (GLUCOTROL) 5 MG tablet Take 5 mg by mouth 1 dose over 24 hours.       . metFORMIN (GLUCOPHAGE) 500 MG tablet Take 500 mg by mouth 2 (two) times daily with a meal.        . simvastatin (ZOCOR) 20 MG tablet Take 20 mg by mouth at bedtime.        Marland Kitchen terazosin (HYTRIN) 1 MG capsule Take 1 mg by mouth at bedtime.        . vitamin B-12 (CYANOCOBALAMIN) 250 MCG tablet Take 250 mcg by mouth daily.          Review of Systems  HENT: Negative.   Eyes: Negative.   Respiratory: Negative.   Cardiovascular: Negative.   Gastrointestinal: Negative.   Musculoskeletal: Positive for gait problem.  Skin: Negative.   Neurological: Negative.   Hematological: Negative.   Psychiatric/Behavioral: Negative.   All other systems reviewed and are negative.    BP 114/74  Pulse 71  Ht 5' 11.5" (1.816 m)  Wt 223 lb (101.152 kg)  BMI 30.67 kg/m2  Physical Exam  Nursing note and vitals reviewed. Constitutional: He is oriented to person, place, and time. He appears well-developed and well-nourished.  HENT:  Head: Normocephalic.  Nose: Nose normal.  Mouth/Throat: Oropharynx is clear and moist.  Eyes: Conjunctivae are normal. Pupils are equal, round, and reactive to light.  Neck: Normal range of motion. Neck supple. No JVD present.  Cardiovascular: Normal rate, regular rhythm, S1 normal, S2 normal and intact distal pulses.  Exam reveals no gallop and no friction rub.   Murmur heard.  Crescendo systolic murmur is present with a grade of 2/6  Pulmonary/Chest: Effort normal and breath sounds normal. No respiratory distress. He has no wheezes. He has no rales. He exhibits no tenderness.  Abdominal: Soft. Bowel sounds are normal. He exhibits no distension. There is no tenderness.  Musculoskeletal: Normal range of motion. He exhibits no edema and no tenderness.  Lymphadenopathy:    He has no cervical adenopathy.  Neurological: He is alert and oriented to person, place, and time. Coordination normal.  Skin: Skin is  warm and dry. No rash noted. No erythema.  Psychiatric: He has a normal mood and affect. His behavior is normal. Judgment and thought content normal.           Assessment and Plan

## 2011-07-05 NOTE — Assessment & Plan Note (Signed)
Currently with no symptoms of angina. No further workup at this time. Continue current medication regimen. 

## 2011-07-05 NOTE — Patient Instructions (Signed)
You are doing well. No medication changes were made.  Please call us if you have new issues that need to be addressed before your next appt.  Your physician wants you to follow-up in: 6 months.  You will receive a reminder letter in the mail two months in advance. If you don't receive a letter, please call our office to schedule the follow-up appointment.   

## 2011-07-05 NOTE — Assessment & Plan Note (Signed)
Maintaining normal sinus rhythm. We'll continue amiodarone and carvedilol

## 2011-07-05 NOTE — Assessment & Plan Note (Addendum)
Diabetes under excellent control, hemoglobin A1c 5.9

## 2011-11-26 ENCOUNTER — Ambulatory Visit: Payer: Self-pay | Admitting: Internal Medicine

## 2012-01-21 ENCOUNTER — Ambulatory Visit (INDEPENDENT_AMBULATORY_CARE_PROVIDER_SITE_OTHER): Payer: Medicare Other | Admitting: Cardiovascular Disease

## 2012-01-21 ENCOUNTER — Encounter: Payer: Self-pay | Admitting: Cardiovascular Disease

## 2012-01-21 VITALS — BP 106/52 | HR 60 | Ht 71.0 in | Wt 226.5 lb

## 2012-01-21 DIAGNOSIS — I4891 Unspecified atrial fibrillation: Secondary | ICD-10-CM

## 2012-01-21 DIAGNOSIS — E785 Hyperlipidemia, unspecified: Secondary | ICD-10-CM

## 2012-01-21 DIAGNOSIS — I2510 Atherosclerotic heart disease of native coronary artery without angina pectoris: Secondary | ICD-10-CM

## 2012-01-21 DIAGNOSIS — I6529 Occlusion and stenosis of unspecified carotid artery: Secondary | ICD-10-CM

## 2012-01-21 DIAGNOSIS — E119 Type 2 diabetes mellitus without complications: Secondary | ICD-10-CM

## 2012-01-21 MED ORDER — CARVEDILOL 3.125 MG PO TABS: 3.1250 mg | ORAL_TABLET | Freq: Two times a day (BID) | ORAL | Status: DC

## 2012-01-21 NOTE — Assessment & Plan Note (Signed)
Cholesterol is at goal on the current lipid regimen. No changes to the medications were made.  

## 2012-01-21 NOTE — Patient Instructions (Addendum)
You are doing well. Please hold the carvedilol 6.25 mg pill Decrease the dose to 3.125 mg twice a day  Please call us if you have new issues that need to be addressed before your next appt.  Your physician wants you to follow-up in: 6 months.  You will receive a reminder letter in the mail two months in advance. If you don't receive a letter, please call our office to schedule the follow-up appointment.

## 2012-01-21 NOTE — Progress Notes (Signed)
Patient ID: Mark Dougherty, male    DOB: 1927/04/08, 78 y.o.   MRN: [id number]  HPI Comments: 78 year old with history of coronary artery disease, bypass surgery in 1996 in [place], peripheral vascular disease with CEA in 2000  on the left, long history of diabetes, recent worsening back pain with MRI showing spinal stenosis, hyperlipidemia, with recent back surgery with complications including atrial fibrillation in the perioperative, postoperative period started on amiodarone with conversion to sinus rhythm, with a repeat admission to [facility] for dehydration, hypotension and malaise. He presents for routine followup.    Overall he feels well with no complaints. His biggest issue is leg weakness and poor balance. He has had 4 falls this year and he uses a cane. He reports that his sugars have been well controlled. Hemoglobin A1c 6.4. Total cholesterol 153 last year.  He's not interested in physical therapy or exercise for his legs .  Notes indicate an ejection fraction of 40% in March of 2011 by echocardiogram.  MRI of his back shows severe spinal stenosis at L2 and L3, other foraminal stenoses of the lumbar region.  EKG shows sinus Rhythm with first degree AV block with rate 60 beats per minute, left axis deviation   Outpatient Encounter Prescriptions as of 01/21/2012  Medication Sig Dispense Refill  . amiodarone (PACERONE) 200 MG tablet Take 200 mg by mouth daily.       Marland Kitchen aspirin 81 MG EC tablet Take 81 mg by mouth daily.        . carvedilol (COREG) 3.125 MG tablet Take 1 tablet (3.125 mg total) by mouth 2 (two) times daily.  180 tablet  4  . folic acid (FOLVITE) 1 MG tablet Take 1 mg by mouth daily.        Marland Kitchen glipiZIDE (GLUCOTROL) 5 MG tablet Take 5 mg by mouth daily.       . metFORMIN (GLUCOPHAGE) 500 MG tablet Take 500 mg by mouth 2 (two) times daily with a meal.        . simvastatin (ZOCOR) 20 MG tablet Take 20 mg by mouth at bedtime.        Marland Kitchen terazosin (HYTRIN) 1 MG capsule Take  1 mg by mouth at bedtime.        . vitamin B-12 (CYANOCOBALAMIN) 250 MCG tablet Take 250 mcg by mouth daily.          Review of Systems  HENT: Negative.   Eyes: Negative.   Respiratory: Negative.   Cardiovascular: Negative.   Gastrointestinal: Negative.   Musculoskeletal: Positive for gait problem.  Skin: Negative.   Neurological: Negative.   Hematological: Negative.   Psychiatric/Behavioral: Negative.   All other systems reviewed and are negative.    BP 106/52  Pulse 60  Ht 5\' 11"  (1.803 m)  Wt 226 lb 8 oz (102.74 kg)  BMI 31.59 kg/m2  Physical Exam  Nursing note and vitals reviewed. Constitutional: He is oriented to person, place, and time. He appears well-developed and well-nourished.  HENT:  Head: Normocephalic.  Nose: Nose normal.  Mouth/Throat: Oropharynx is clear and moist.  Eyes: Conjunctivae normal are normal. Pupils are equal, round, and reactive to light.  Neck: Normal range of motion. Neck supple. No JVD present.  Cardiovascular: Normal rate, regular rhythm, S1 normal, S2 normal and intact distal pulses.  Exam reveals no gallop and no friction rub.   Murmur heard.  Crescendo systolic murmur is present with a grade of 2/6  Pulmonary/Chest: Effort normal and breath  sounds normal. No respiratory distress. He has no wheezes. He has no rales. He exhibits no tenderness.  Abdominal: Soft. Bowel sounds are normal. He exhibits no distension. There is no tenderness.  Musculoskeletal: Normal range of motion. He exhibits no edema and no tenderness.  Lymphadenopathy:    He has no cervical adenopathy.  Neurological: He is alert and oriented to person, place, and time. Coordination normal.  Skin: Skin is warm and dry. No rash noted. No erythema.  Psychiatric: He has a normal mood and affect. His behavior is normal. Judgment and thought content normal.           Assessment and Plan

## 2012-01-21 NOTE — Assessment & Plan Note (Signed)
Currently with no symptoms of angina. No further workup at this time. Continue current medication regimen. 

## 2012-01-21 NOTE — Assessment & Plan Note (Signed)
He is maintaining normal sinus rhythm. We will continue the amiodarone, carvedilol. Given recent dizziness and gait instability, we will do a trial of low-dose carvedilol 3.125 mg twice a day.

## 2012-01-21 NOTE — Assessment & Plan Note (Addendum)
Continue aggressive lipid management History of carotid endarterectomy in 2000. We'll discuss repeat ultrasound with him on his next visit.

## 2012-01-21 NOTE — Assessment & Plan Note (Signed)
Diabetes well controlled with hemoglobin A1c in the 6 range

## 2012-01-28 ENCOUNTER — Other Ambulatory Visit: Payer: Self-pay | Admitting: Cardiovascular Disease

## 2012-01-28 MED ORDER — CARVEDILOL 3.125 MG PO TABS: 3.1250 mg | ORAL_TABLET | Freq: Two times a day (BID) | ORAL | Status: DC

## 2012-01-28 NOTE — Telephone Encounter (Signed)
Pt calling says that [facility] never received his script for the carvedilol

## 2012-01-28 NOTE — Telephone Encounter (Signed)
Refill sent for carvedilol.  

## 2012-02-12 ENCOUNTER — Other Ambulatory Visit: Payer: Self-pay | Admitting: Cardiovascular Disease

## 2012-02-13 ENCOUNTER — Other Ambulatory Visit: Payer: Self-pay | Admitting: *Deleted

## 2012-02-13 MED ORDER — AMIODARONE HCL 200 MG PO TABS: 200.0000 mg | ORAL_TABLET | Freq: Every day | ORAL | Status: DC

## 2012-02-13 NOTE — Telephone Encounter (Signed)
Refilled Pacerone. 

## 2012-06-14 ENCOUNTER — Other Ambulatory Visit: Payer: Self-pay

## 2012-09-01 ENCOUNTER — Encounter: Payer: Self-pay | Admitting: Cardiovascular Disease

## 2012-09-01 ENCOUNTER — Ambulatory Visit (INDEPENDENT_AMBULATORY_CARE_PROVIDER_SITE_OTHER): Payer: Medicare Other | Admitting: Cardiovascular Disease

## 2012-09-01 VITALS — BP 130/72 | HR 62 | Ht 71.0 in | Wt 210.2 lb

## 2012-09-01 DIAGNOSIS — I4891 Unspecified atrial fibrillation: Secondary | ICD-10-CM

## 2012-09-01 DIAGNOSIS — I658 Occlusion and stenosis of other precerebral arteries: Secondary | ICD-10-CM

## 2012-09-01 DIAGNOSIS — I6529 Occlusion and stenosis of unspecified carotid artery: Secondary | ICD-10-CM

## 2012-09-01 DIAGNOSIS — I420 Dilated cardiomyopathy: Secondary | ICD-10-CM

## 2012-09-01 DIAGNOSIS — I2510 Atherosclerotic heart disease of native coronary artery without angina pectoris: Secondary | ICD-10-CM

## 2012-09-01 DIAGNOSIS — E119 Type 2 diabetes mellitus without complications: Secondary | ICD-10-CM

## 2012-09-01 DIAGNOSIS — I6523 Occlusion and stenosis of bilateral carotid arteries: Secondary | ICD-10-CM

## 2012-09-01 DIAGNOSIS — I428 Other cardiomyopathies: Secondary | ICD-10-CM

## 2012-09-01 NOTE — Patient Instructions (Addendum)
You are doing well. No medication changes were made.  We will schedule you for a carotid ultrasound for hx of carotid stenosis, hx of CEA  Please call us if you have new issues that need to be addressed before your next appt.  Your physician wants you to follow-up in: 6 months.  You will receive a reminder letter in the mail two months in advance. If you don't receive a letter, please call our office to schedule the follow-up appointment.

## 2012-09-01 NOTE — Progress Notes (Signed)
Patient ID: Mark Dougherty, male    DOB: 12-07-1926, 79 y.o.   MRN: [id number]  HPI Comments: 79 year old with history of coronary artery disease, bypass surgery in 1996 in [place], peripheral vascular disease with CEA on the left in 2000  on the left, long history of diabetes, severe chronic back pain with MRI showing spinal stenosis, hyperlipidemia, with recent back surgery with complications including atrial fibrillation in the perioperative, postoperative period started on amiodarone with conversion to sinus rhythm, with a repeat admission to [facility] for dehydration, hypotension and malaise. He presents for routine followup.   Overall he feels well with no complaints. His biggest issue is leg weakness and poor balance. he uses a cane. History of falls in the past . He reports that his sugars have been well controlled. Rare episodes of low sugar .  Hemoglobin A1c 6.4. Total cholesterol 153 last year.  He's not interested in physical therapy or exercise for his legs . This hurts his back. Some shortness of breath when he walks, though wife reports that he walks bent over. Very shallow breaths  Notes indicate an ejection fraction of 40% in March of 2011 by echocardiogram.  MRI of his back shows severe spinal stenosis at L2 and L3, other foraminal stenoses of the lumbar region.  EKG shows sinus Rhythm with first degree AV block with rate 62 beats per minute, left axis deviation   Outpatient Encounter Prescriptions as of 09/01/2012  Medication Sig Dispense Refill  . amiodarone (PACERONE) 200 MG tablet Take 1 tablet (200 mg total) by mouth daily.  30 tablet  3  . aspirin 81 MG EC tablet Take 81 mg by mouth daily.        . carvedilol (COREG) 3.125 MG tablet Take 1 tablet (3.125 mg total) by mouth 2 (two) times daily.  180 tablet  4  . folic acid (FOLVITE) 1 MG tablet Take 1 mg by mouth daily.        Marland Kitchen glipiZIDE (GLUCOTROL) 5 MG tablet Take 5 mg by mouth daily.       . metFORMIN (GLUCOPHAGE)  500 MG tablet Take 500 mg by mouth 2 (two) times daily with a meal.        . simvastatin (ZOCOR) 20 MG tablet Take 20 mg by mouth at bedtime.        Marland Kitchen terazosin (HYTRIN) 1 MG capsule Take 1 mg by mouth at bedtime.        . vitamin B-12 (CYANOCOBALAMIN) 250 MCG tablet Take 250 mcg by mouth daily.        . [DISCONTINUED] PACERONE 200 MG tablet TAKE ONE TABLET BY MOUTH EVERY DAY  90 tablet  3   No facility-administered encounter medications on file as of 09/01/2012.     Review of Systems  Constitutional: Negative.   HENT: Negative.   Eyes: Negative.   Respiratory: Negative.   Cardiovascular: Negative.   Gastrointestinal: Negative.   Musculoskeletal: Positive for gait problem.  Skin: Negative.   Neurological: Negative.   Psychiatric/Behavioral: Negative.   All other systems reviewed and are negative.    BP 130/72  Pulse 62  Ht 5\' 11"  (1.803 m)  Wt 210 lb 4 oz (95.369 kg)  BMI 29.34 kg/m2  Physical Exam  Nursing note and vitals reviewed. Constitutional: He is oriented to person, place, and time. He appears well-developed and well-nourished.  HENT:  Head: Normocephalic.  Nose: Nose normal.  Mouth/Throat: Oropharynx is clear and moist.  Eyes: Conjunctivae are normal.  Pupils are equal, round, and reactive to light.  Neck: Normal range of motion. Neck supple. No JVD present.  Cardiovascular: Normal rate, regular rhythm, S1 normal, S2 normal and intact distal pulses.  Exam reveals no gallop and no friction rub.   Murmur heard.  Crescendo systolic murmur is present with a grade of 2/6  Pulmonary/Chest: Effort normal and breath sounds normal. No respiratory distress. He has no wheezes. He has no rales. He exhibits no tenderness.  Abdominal: Soft. Bowel sounds are normal. He exhibits no distension. There is no tenderness.  Musculoskeletal: Normal range of motion. He exhibits no edema and no tenderness.  Lymphadenopathy:    He has no cervical adenopathy.  Neurological: He is alert and  oriented to person, place, and time. Coordination normal.  Skin: Skin is warm and dry. No rash noted. No erythema.  Psychiatric: He has a normal mood and affect. His behavior is normal. Judgment and thought content normal.      Assessment and Plan

## 2012-09-01 NOTE — Assessment & Plan Note (Signed)
maintaining normal sinus rhythm. No medication changes made

## 2012-09-01 NOTE — Assessment & Plan Note (Signed)
Previous ejection fraction mildly depressed. No clinical signs of heart failure. Overall doing well

## 2012-09-01 NOTE — Assessment & Plan Note (Signed)
Sugars running low at times, sometimes down to the 40s. Recently had one episode with sweating, had to take sugar pills. We have suggested he talk with Dr. Arlana Pouch. If he continues to have episodes, may need to decrease his medications or take a snack at night.

## 2012-09-01 NOTE — Assessment & Plan Note (Signed)
Currently with no symptoms of angina. No further workup at this time. Continue current medication regimen. 

## 2012-09-01 NOTE — Assessment & Plan Note (Signed)
Repeat carotid ultrasound ordered. History of CEA on the left. He reports disease on the right. No recent ultrasound

## 2012-10-02 IMAGING — CR DG LUMBAR SPINE 1V
1 series · 1 of 1 positions shown · non-contrast
Comparison: None.

CLINICAL DATA: L2-L3 and L4-L5 central laminectomy.

LUMBAR SPINE - 1 VIEW

[view not recorded]
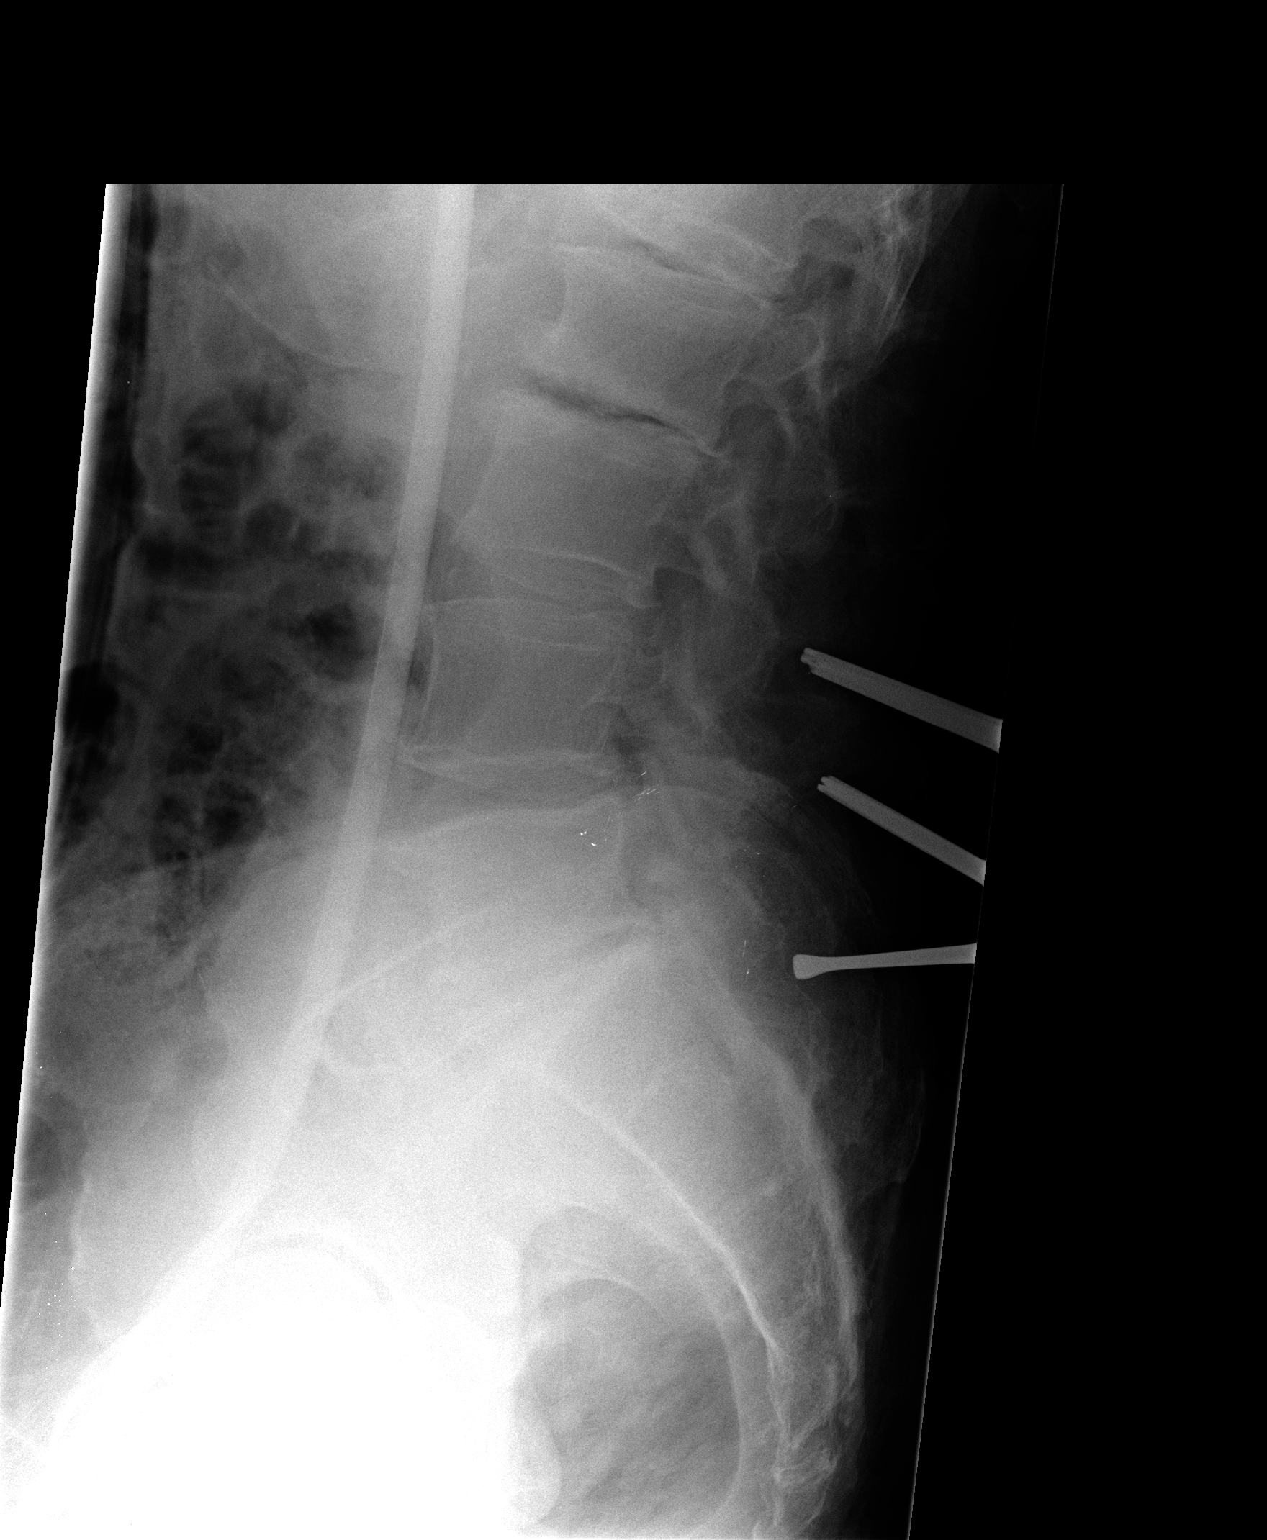

[1 of 1 positions shown; findings below may reference images not displayed]

FINDINGS: Single cross-table lateral view of the lumbar spine at
7777 hours.  Five lumbar type vertebral bodies are assumed.  There
are three posterior surgical instruments overlapping the upper
sacrum, the L4 spinous process and between the L3 and L4 spinous
processes.  The lumbar alignment is normal.
IMPRESSION: Intraoperative localization as described.

## 2012-10-15 ENCOUNTER — Encounter (INDEPENDENT_AMBULATORY_CARE_PROVIDER_SITE_OTHER): Payer: Medicare Other

## 2012-10-15 DIAGNOSIS — I6529 Occlusion and stenosis of unspecified carotid artery: Secondary | ICD-10-CM

## 2012-10-15 DIAGNOSIS — I6523 Occlusion and stenosis of bilateral carotid arteries: Secondary | ICD-10-CM

## 2012-12-03 ENCOUNTER — Other Ambulatory Visit: Payer: Self-pay

## 2013-02-02 ENCOUNTER — Other Ambulatory Visit: Payer: Self-pay | Admitting: *Deleted

## 2013-02-02 MED ORDER — CARVEDILOL 3.125 MG PO TABS: 3.1250 mg | ORAL_TABLET | Freq: Two times a day (BID) | ORAL | Status: DC

## 2013-02-02 NOTE — Telephone Encounter (Signed)
Refilled Carvedilol sent to [facility] pharmacy.

## 2013-03-05 ENCOUNTER — Other Ambulatory Visit: Payer: Self-pay

## 2013-03-14 ENCOUNTER — Other Ambulatory Visit: Payer: Self-pay | Admitting: Cardiovascular Disease

## 2013-03-16 ENCOUNTER — Other Ambulatory Visit: Payer: Self-pay | Admitting: *Deleted

## 2013-03-16 MED ORDER — AMIODARONE HCL 200 MG PO TABS: 200.0000 mg | ORAL_TABLET | Freq: Every day | ORAL | Status: DC

## 2013-03-16 NOTE — Telephone Encounter (Signed)
Requested Prescriptions   Signed Prescriptions Disp Refills  . amiodarone (PACERONE) 200 MG tablet 30 tablet 3    Sig: Take 1 tablet (200 mg total) by mouth daily.    Authorizing Provider: GOLLAN, TIMOTHY J    Ordering User: Quinterius Gaida C    

## 2013-08-11 ENCOUNTER — Other Ambulatory Visit: Payer: Self-pay | Admitting: Cardiovascular Disease

## 2013-10-16 ENCOUNTER — Inpatient Hospital Stay: Payer: Self-pay | Admitting: Internal Medicine

## 2013-10-16 LAB — CBC
HCT: 36.5 % — AB (ref 40.0–52.0)
HGB: 12 g/dL — AB (ref 13.0–18.0)
MCH: 31.7 pg (ref 26.0–34.0)
MCHC: 32.9 g/dL (ref 32.0–36.0)
MCV: 96 fL (ref 80–100)
Platelet: 339 10*3/uL (ref 150–440)
RBC: 3.79 10*6/uL — ABNORMAL LOW (ref 4.40–5.90)
RDW: 12.8 % (ref 11.5–14.5)
WBC: 14.2 10*3/uL — ABNORMAL HIGH (ref 3.8–10.6)

## 2013-10-16 LAB — BASIC METABOLIC PANEL
Anion Gap: 7 (ref 7–16)
BUN: 30 mg/dL — ABNORMAL HIGH (ref 7–18)
CHLORIDE: 103 mmol/L (ref 98–107)
CREATININE: 1.43 mg/dL — AB (ref 0.60–1.30)
Calcium, Total: 9.1 mg/dL (ref 8.5–10.1)
Co2: 26 mmol/L (ref 21–32)
GFR CALC AF AMER: 52 — AB
GFR CALC NON AF AMER: 45 — AB
Glucose: 117 mg/dL — ABNORMAL HIGH (ref 65–99)
Osmolality: 279 (ref 275–301)
Potassium: 4 mmol/L (ref 3.5–5.1)
Sodium: 136 mmol/L (ref 136–145)

## 2013-10-16 LAB — URINALYSIS, COMPLETE
BACTERIA: NONE SEEN
Bilirubin,UR: NEGATIVE
Blood: NEGATIVE
Glucose,UR: NEGATIVE mg/dL (ref 0–75)
KETONE: NEGATIVE
Leukocyte Esterase: NEGATIVE
NITRITE: NEGATIVE
PROTEIN: NEGATIVE
Ph: 5 (ref 4.5–8.0)
RBC,UR: 1 /HPF (ref 0–5)
Specific Gravity: 1.023 (ref 1.003–1.030)
Squamous Epithelial: NONE SEEN
WBC UR: 1 /HPF (ref 0–5)

## 2013-10-16 LAB — TROPONIN I: Troponin-I: <0.02 ng/mL

## 2013-10-17 LAB — CBC WITH DIFFERENTIAL/PLATELET
BASOS ABS: 0 10*3/uL (ref 0.0–0.1)
BASOS PCT: 0.2 %
Eosinophil #: 0 10*3/uL (ref 0.0–0.7)
Eosinophil %: 0.1 %
HCT: 33.3 % — ABNORMAL LOW (ref 40.0–52.0)
HGB: 11.1 g/dL — ABNORMAL LOW (ref 13.0–18.0)
Lymphocyte #: 1.6 10*3/uL (ref 1.0–3.6)
Lymphocyte %: 19.3 %
MCH: 31.9 pg (ref 26.0–34.0)
MCHC: 33.4 g/dL (ref 32.0–36.0)
MCV: 96 fL (ref 80–100)
MONOS PCT: 3.2 %
Monocyte #: 0.3 x10 3/mm (ref 0.2–1.0)
Neutrophil #: 6.3 10*3/uL (ref 1.4–6.5)
Neutrophil %: 77.2 %
Platelet: 296 10*3/uL (ref 150–440)
RBC: 3.49 10*6/uL — ABNORMAL LOW (ref 4.40–5.90)
RDW: 12.8 % (ref 11.5–14.5)
WBC: 8.2 10*3/uL (ref 3.8–10.6)

## 2013-10-17 LAB — HEMOGLOBIN A1C: Hemoglobin A1C: 6.4 % — ABNORMAL HIGH (ref 4.2–6.3)

## 2013-10-17 LAB — BASIC METABOLIC PANEL
ANION GAP: 6 — AB (ref 7–16)
BUN: 27 mg/dL — AB (ref 7–18)
CALCIUM: 8.6 mg/dL (ref 8.5–10.1)
CHLORIDE: 103 mmol/L (ref 98–107)
Co2: 26 mmol/L (ref 21–32)
Creatinine: 1.25 mg/dL (ref 0.60–1.30)
EGFR (African American): >60
EGFR (Non-African Amer.): 53 — ABNORMAL LOW
Glucose: 217 mg/dL — ABNORMAL HIGH (ref 65–99)
OSMOLALITY: 282 (ref 275–301)
Potassium: 4.1 mmol/L (ref 3.5–5.1)
Sodium: 135 mmol/L — ABNORMAL LOW (ref 136–145)

## 2013-10-17 LAB — TSH: THYROID STIMULATING HORM: 0.129 u[IU]/mL — AB

## 2013-10-17 LAB — URINE CULTURE

## 2013-10-18 LAB — T4, FREE: Free Thyroxine: 1.49 ng/dL — ABNORMAL HIGH (ref 0.76–1.46)

## 2013-10-18 LAB — SODIUM: Sodium: 138 mmol/L (ref 136–145)

## 2013-10-21 LAB — CULTURE, BLOOD (SINGLE)

## 2013-10-22 ENCOUNTER — Ambulatory Visit: Payer: Self-pay | Admitting: Podiatrist

## 2013-10-29 ENCOUNTER — Ambulatory Visit (INDEPENDENT_AMBULATORY_CARE_PROVIDER_SITE_OTHER): Payer: Medicare Other | Admitting: Podiatrist

## 2013-10-29 VITALS — BP 103/65 | HR 79 | Resp 16

## 2013-10-29 DIAGNOSIS — M79609 Pain in unspecified limb: Secondary | ICD-10-CM

## 2013-10-29 DIAGNOSIS — B351 Tinea unguium: Secondary | ICD-10-CM

## 2013-10-29 DIAGNOSIS — M79673 Pain in unspecified foot: Secondary | ICD-10-CM

## 2013-10-29 NOTE — Progress Notes (Signed)
HPI:  Patient presents today for follow up of foot and nail care. Denies any new complaints today.  Objective:  Patients chart is reviewed.  Vascular status reveals pedal pulses noted at 1 out of 4 dp and pt bilateral .  Neurological sensation is Normal to [facility] monofilament bilateral.  Patients nails are thickened, discolored, distrophic, friable and brittle with yellow-brown discoloration. Patient subjectively relates they are painful with shoes and with ambulation of bilateral feet. Hammertoe right 2nd with a small scab medial proxmial ip joint-- stable.    Assessment:  Symptomatic onychomycosis  Plan:  Discussed treatment options and alternatives.  The symptomatic toenails were debrided through manual an mechanical means without complication.  Return appointment recommended at routine intervals of 3 months    Marlowe Aschoff, DPM

## 2013-10-29 NOTE — Patient Instructions (Signed)
Diabetes and Foot Care Diabetes may cause you to have problems because of poor blood supply (circulation) to your feet and legs. This may cause the skin on your feet to become thinner, break easier, and heal more slowly. Your skin may become dry, and the skin may peel and crack. You may also have nerve damage in your legs and feet causing decreased feeling in them. You may not notice minor injuries to your feet that could lead to infections or more serious problems. Taking care of your feet is one of the most important things you can do for yourself.  HOME CARE INSTRUCTIONS  Wear shoes at all times, even in the house. Do not go barefoot. Bare feet are easily injured.  Check your feet daily for blisters, cuts, and redness. If you cannot see the bottom of your feet, use a mirror or ask someone for help.  Wash your feet with warm water (do not use hot water) and mild soap. Then pat your feet and the areas between your toes until they are completely dry. Do not soak your feet as this can dry your skin.  Apply a moisturizing lotion or petroleum jelly (that does not contain alcohol and is unscented) to the skin on your feet and to dry, brittle toenails. Do not apply lotion between your toes.  Trim your toenails straight across. Do not dig under them or around the cuticle. File the edges of your nails with an emery board or nail file.  Do not cut corns or calluses or try to remove them with medicine.  Wear clean socks or stockings every day. Make sure they are not too tight. Do not wear knee-high stockings since they may decrease blood flow to your legs.  Wear shoes that fit properly and have enough cushioning. To break in new shoes, wear them for just a few hours a day. This prevents you from injuring your feet. Always look in your shoes before you put them on to be sure there are no objects inside.  Do not cross your legs. This may decrease the blood flow to your feet.  If you find a minor scrape,  cut, or break in the skin on your feet, keep it and the skin around it clean and dry. These areas may be cleansed with mild soap and water. Do not cleanse the area with peroxide, alcohol, or iodine.  When you remove an adhesive bandage, be sure not to damage the skin around it.  If you have a wound, look at it several times a day to make sure it is healing.  Do not use heating pads or hot water bottles. They may burn your skin. If you have lost feeling in your feet or legs, you may not know it is happening until it is too late.  Make sure your health care provider performs a complete foot exam at least annually or more often if you have foot problems. Report any cuts, sores, or bruises to your health care provider immediately. SEEK MEDICAL CARE IF:   You have an injury that is not healing.  You have cuts or breaks in the skin.  You have an ingrown nail.  You notice redness on your legs or feet.  You feel burning or tingling in your legs or feet.  You have pain or cramps in your legs and feet.  Your legs or feet are numb.  Your feet always feel cold. SEEK IMMEDIATE MEDICAL CARE IF:   There is increasing redness,   swelling, or pain in or around a wound.  There is a red line that goes up your leg.  Pus is coming from a wound.  You develop a fever or as directed by your health care provider.  You notice a bad smell coming from an ulcer or wound. Document Released: 04/13/2000 Document Revised: 12/17/2012 Document Reviewed: 09/23/2012 Exit[employer] Patient Information 2015 [employer]. This information is not intended to replace advice given to you by your health care provider. Make sure you discuss any questions you have with your health care provider.  

## 2013-11-05 ENCOUNTER — Encounter: Payer: Self-pay | Admitting: Cardiovascular Disease

## 2013-11-05 ENCOUNTER — Ambulatory Visit (INDEPENDENT_AMBULATORY_CARE_PROVIDER_SITE_OTHER): Payer: Medicare Other | Admitting: Cardiovascular Disease

## 2013-11-05 VITALS — BP 110/72 | HR 73 | Ht 71.0 in | Wt 208.0 lb

## 2013-11-05 DIAGNOSIS — I6529 Occlusion and stenosis of unspecified carotid artery: Secondary | ICD-10-CM

## 2013-11-05 DIAGNOSIS — E119 Type 2 diabetes mellitus without complications: Secondary | ICD-10-CM

## 2013-11-05 DIAGNOSIS — I428 Other cardiomyopathies: Secondary | ICD-10-CM

## 2013-11-05 DIAGNOSIS — I4891 Unspecified atrial fibrillation: Secondary | ICD-10-CM

## 2013-11-05 DIAGNOSIS — E785 Hyperlipidemia, unspecified: Secondary | ICD-10-CM

## 2013-11-05 DIAGNOSIS — I420 Dilated cardiomyopathy: Secondary | ICD-10-CM

## 2013-11-05 DIAGNOSIS — I2510 Atherosclerotic heart disease of native coronary artery without angina pectoris: Secondary | ICD-10-CM

## 2013-11-05 MED ORDER — ATORVASTATIN CALCIUM 10 MG PO TABS: 10.0000 mg | ORAL_TABLET | Freq: Every day | ORAL | Status: DC

## 2013-11-05 NOTE — Assessment & Plan Note (Signed)
No recent episodes of atrial fibrillation. Suspect the last episode was several years ago after back surgery. Amiodarone recently held for abnormal thyroid numbers. Suggested he continue to hold the amiodarone, continue low-dose carvedilol. Recommended he watch his blood pressure. If there is room on his blood pressure 4 higher dose carvedilol, we would increase this up to 6.25 mg twice a day otherwise we'll continue the lower 3 mg dose

## 2013-11-05 NOTE — Progress Notes (Signed)
Patient ID: Mark Dougherty, male    DOB: 05/06/1926, 80 y.o.   MRN: [id number]  HPI Comments: 80 year old with history of coronary artery disease, bypass surgery in 1996 in [place], peripheral vascular disease with CEA on the left in 2000 on the left, long history of diabetes, severe chronic back pain with MRI showing spinal stenosis, hyperlipidemia, with recent back surgery 4 to 5 years ago with complications including atrial fibrillation in the perioperative, postoperative period started on amiodarone with conversion to sinus rhythm, with a repeat admission to [facility] for dehydration, hypotension and malaise. He presents for routine followup.  Recent hospitalization in June 19th 2015 for weakness, cough, bronchitis, fall with left radial fracture. Noted to have low TSH, elevated free T4. Amiodarone was held. Also with mild renal insufficiency, elevated sugars. He was treated with antibiotics with improvement of his symptoms. Now doing home rehabilitation. He denies any lightheadedness or dizziness. Nurses are checking his blood pressure at home and reports is well controlled. Denies any tachycardia concerning for atrial fibrillation. Recent lab work showing hemoglobin A1c 6.4 Left wrist is in a soft cast. For his acute COPD exacerbation in the hospital, he did improve with antibiotics and steroid taper  he uses a cane. History of falls in the past .  Total cholesterol 153 last year.   Notes indicate an ejection fraction of 40% in March of 2011 by echocardiogram.  MRI of his back shows severe spinal stenosis at L2 and L3, other foraminal stenoses of the lumbar region.  EKG shows sinus Rhythm with first degree AV block with rate 73 beats per minute, left axis deviation   Outpatient Encounter Prescriptions as of 11/05/2013  Medication Sig  . aspirin 81 MG EC tablet Take 81 mg by mouth daily.    Marland Kitchen atorvastatin (LIPITOR) 10 MG tablet Take 10 mg by mouth daily.  . carvedilol (COREG) 3.125 MG  tablet Take 1 tablet (3.125 mg total) by mouth 2 (two) times daily.  . folic acid (FOLVITE) 1 MG tablet Take 1 mg by mouth daily.    Marland Kitchen glipiZIDE (GLUCOTROL) 5 MG tablet Take 5 mg by mouth daily.   . metFORMIN (GLUCOPHAGE) 500 MG tablet Take 500 mg by mouth 2 (two) times daily with a meal.    . terazosin (HYTRIN) 1 MG capsule Take 1 mg by mouth at bedtime.    . vitamin B-12 (CYANOCOBALAMIN) 250 MCG tablet Take 250 mcg by mouth daily.    . [DISCONTINUED] PACERONE 200 MG tablet TAKE ONE TABLET BY MOUTH EVERY DAY  . [DISCONTINUED] simvastatin (ZOCOR) 20 MG tablet Take 20 mg by mouth at bedtime.     Review of Systems  Constitutional: Negative.   HENT: Negative.   Eyes: Negative.   Respiratory: Negative.   Cardiovascular: Negative.   Gastrointestinal: Negative.   Endocrine: Negative.   Musculoskeletal: Positive for gait problem.       Wrist pain from left radial fracture  Skin: Negative.   Allergic/Immunologic: Negative.   Neurological: Negative.   Hematological: Negative.   Psychiatric/Behavioral: Negative.   All other systems reviewed and are negative.   BP 110/72  Pulse 73  Ht 5\' 11"  (1.803 m)  Wt 208 lb (94.348 kg)  BMI 29.02 kg/m2  Physical Exam  Nursing note and vitals reviewed. Constitutional: He is oriented to person, place, and time. He appears well-developed and well-nourished.  HENT:  Head: Normocephalic.  Nose: Nose normal.  Mouth/Throat: Oropharynx is clear and moist.  Eyes: Conjunctivae are normal.  Pupils are equal, round, and reactive to light.  Neck: Normal range of motion. Neck supple. No JVD present.  Cardiovascular: Normal rate, regular rhythm, S1 normal, S2 normal and intact distal pulses.  Exam reveals no gallop and no friction rub.   Murmur heard.  Crescendo systolic murmur is present with a grade of 2/6  Pulmonary/Chest: Effort normal and breath sounds normal. No respiratory distress. He has no wheezes. He has no rales. He exhibits no tenderness.   Abdominal: Soft. Bowel sounds are normal. He exhibits no distension. There is no tenderness.  Musculoskeletal: Normal range of motion. He exhibits no edema and no tenderness.  Lymphadenopathy:    He has no cervical adenopathy.  Neurological: He is alert and oriented to person, place, and time. Coordination normal.  Skin: Skin is warm and dry. No rash noted. No erythema.  Psychiatric: He has a normal mood and affect. His behavior is normal. Judgment and thought content normal.      Assessment and Plan

## 2013-11-05 NOTE — Assessment & Plan Note (Signed)
Currently with no symptoms of angina. No further workup at this time. Continue current medication regimen. 

## 2013-11-05 NOTE — Patient Instructions (Addendum)
You are doing well. No medication changes were made. OK to hold the amiodarone  Monitor your blood pressure   Please call us if you have new issues that need to be addressed before your next appt.  Your physician wants you to follow-up in: 6 months.  You will receive a reminder letter in the mail two months in advance. If you don't receive a letter, please call our office to schedule the follow-up appointment.

## 2013-11-05 NOTE — Assessment & Plan Note (Signed)
History of CEA on the left.  40 to 59% b/l carotid disease in 2014

## 2013-11-05 NOTE — Assessment & Plan Note (Signed)
Appears relatively euvolemic on today's visit. Ejection fraction 40% in  2011. 

## 2013-11-05 NOTE — Assessment & Plan Note (Signed)
Hemoglobin A1c 6.4. We have encouraged continued  careful diet management

## 2013-11-05 NOTE — Assessment & Plan Note (Addendum)
Since the prior clinic visit, changed to Lipitor 10 mg daily. We'll continue this for now. Goal LDL less than 70. Last lipid panel in March 2015 with Dr. Arlana Pouch.

## 2013-11-09 ENCOUNTER — Encounter: Payer: Self-pay | Admitting: Cardiovascular Disease

## 2013-11-09 ENCOUNTER — Telehealth: Payer: Self-pay

## 2013-11-09 MED ORDER — SIMVASTATIN 20 MG PO TABS: 20.0000 mg | ORAL_TABLET | Freq: Every day | ORAL | Status: DC

## 2013-11-09 NOTE — Telephone Encounter (Signed)
Refill sent for simvastatin.  

## 2013-11-18 ENCOUNTER — Other Ambulatory Visit (HOSPITAL_COMMUNITY): Payer: Self-pay | Admitting: *Deleted

## 2013-11-18 DIAGNOSIS — I6529 Occlusion and stenosis of unspecified carotid artery: Secondary | ICD-10-CM

## 2013-12-11 ENCOUNTER — Encounter (INDEPENDENT_AMBULATORY_CARE_PROVIDER_SITE_OTHER): Payer: Medicare Other

## 2013-12-11 DIAGNOSIS — I6529 Occlusion and stenosis of unspecified carotid artery: Secondary | ICD-10-CM

## 2014-03-09 ENCOUNTER — Ambulatory Visit (INDEPENDENT_AMBULATORY_CARE_PROVIDER_SITE_OTHER): Payer: Medicare Other | Admitting: Podiatry

## 2014-03-09 ENCOUNTER — Ambulatory Visit (INDEPENDENT_AMBULATORY_CARE_PROVIDER_SITE_OTHER): Payer: Medicare Other

## 2014-03-09 DIAGNOSIS — B351 Tinea unguium: Secondary | ICD-10-CM

## 2014-03-09 DIAGNOSIS — M79676 Pain in unspecified toe(s): Secondary | ICD-10-CM

## 2014-03-09 DIAGNOSIS — I2510 Atherosclerotic heart disease of native coronary artery without angina pectoris: Secondary | ICD-10-CM

## 2014-03-09 DIAGNOSIS — S92301A Fracture of unspecified metatarsal bone(s), right foot, initial encounter for closed fracture: Secondary | ICD-10-CM

## 2014-03-09 DIAGNOSIS — M8430XA Stress fracture, unspecified site, initial encounter for fracture: Secondary | ICD-10-CM

## 2014-03-09 NOTE — Patient Instructions (Signed)
Stress Fracture When too much stress is put on the foot, as may occur in running and jumping sports, the lengthy shafts of the bones of the forefoot become susceptible to breaking due to repetitive stress (stress fracture) because of thinness of these bone. A stress fracture is more common if osteoporosis is present or if inadequate athletic footwear is used. Shoes should be used which adequately support the sole of the foot to absorb the shocks of the activity participated in. Stress fractures are very common in competitive male runners who develop these small cracks on the surface of the bones in their legs and feet. The women most likely to suffer these injuries are those who restrict food intake and those who have irregular periods. Stress fractures usually start out as a minor discomfort in the foot or leg. The completion of fracture due to repetitive loading often occurs near the end of a long run. The pain may dissipate with rest. With the next exercise session, the pain may return earlier in the run. If an athlete notices that it hurts to touch just one spot on a bone and then stops running for a week, he or she may be tempted to return to running too soon. Often the pain is ignored in order to continue with high impact exercise. A stress fracture then develops. The athlete now has to avoid the hard pounding of running, but can ride a bike or swim for exercise once the pain has resolved with normal weight bearing until the fracture heals in 6-12 weeks. The most common sites for stress fractures are the bones in the front of the feet (metatarsals) and the long bone of the lower leg (tibia), but running can cause stress fractures anywhere in the lower extremities or pelvis. DIAGNOSIS  Usually the diagnosis is made by reviewing the patient's history. The bone involved progressively becomes more painful with activities. X-rays may show no break within the first 2-3 weeks that pain begins. A later X-ray may  show signs that the bone is healing. Having a bone scan or MRI usually makes an earlier diagnosis possible. HOME CARE INSTRUCTIONS  Treatment may include a cast or walking shoe.  High impact activities should be stopped until advised by your caregiver.  Wear shoes with adequate shock absorbing abilities and good support of the sole of the foot. This is especially important in the arch of the foot.  Alternative exercise may be undertaken while waiting for healing. This may include bicycling and swimming. If you do not have a cast or splint:  You may walk on your injured foot as tolerated or advised.  Do not put any weight on your injured foot until instructed. Slowly increase the amount of time you walk on the foot as the pain allows or as advised.  Use crutches until you can bear weight without pain. A gradual increase in weight bearing may help.  Apply ice to the injured area for the first 2 days after you have been treated or as directed by your caregiver.  Put ice in a plastic bag.  Place a towel between your skin and the bag.  Leave the ice on for 15-20 minutes at a time, every hour while you are awake.  Only take over-the-counter or prescription medicines for pain or discomfort as directed by your caregiver.  If your caregiver has given you a follow-up appointment, it is very important to keep that appointment. Not keeping the appointment could result in a chronic or permanent   injury, pain, and disability. SEEK IMMEDIATE MEDICAL CARE IF:   Pain is becoming worse rather than better.  Pain is uncontrolled with medicine.  You have increased swelling or redness in the foot.  The feeling in the foot or leg is diminished. MAKE SURE YOU:   Understand these instructions.  Will watch your condition.  Will get help right away if you are not doing well or get worse. Document Released: 07/07/2002 Document Revised: 08/11/2012 Document Reviewed: 12/01/2007 Exit[employer] Patient  Information 2015 [employer]. This information is not intended to replace advice given to you by your health care provider. Make sure you discuss any questions you have with your health care provider.  

## 2014-03-10 ENCOUNTER — Telehealth: Payer: Self-pay | Admitting: *Deleted

## 2014-03-10 NOTE — Telephone Encounter (Signed)
Pt called and stated you wanted some information on his blood circulation. Pt said you can call dr Marylene Land at [phone] or fax him at [phone].

## 2014-03-10 NOTE — Progress Notes (Signed)
Patient ID: Mark Dougherty, male   DOB: 03-Mar-1927, 80 y.o.   MRN: [id number]  Subjective: Mark Dougherty, 80 year old male, presents the office today with painful elongated nails particularly with shoe gear. He also states that he has a "dark spot" on the top of his right foot over the toe joints as well as swelling. He denies any history of injury or trauma to the area or dropping anything on his foot. He states that the area some the cane mop when he will cup he noticed it. He denies any specific pain to the area. No other complaints at this time.  Objective: AAO 3, NAD DP/PT pulses decreased bilaterally, CRT less than 3 seconds Protective sensation decreased with Sims Wiesner monofilament, decreased vibratory sensation. Nails hypertrophic, dystrophic, elongated, brittle, discolored 10. There is mild surrounding erythema on the third digit on the left foot for which the patient has not noticed before. There is no drainage from around the area and no ascending cellulitis. Erythema decreases when in a dependent position. There is an area of ecchymosis over the dorsal aspect of the right foot overlying the second through fourth MTPJ. There is mild tenderness to palpation over the second metatarsal. There is mild overlying edema to the ankle. No areas of erythema or increased warmth on the right foot. MMT 4/5, ROM WNL No calf pain with compression, swelling, warmth, erythema. No open lesions.  Assessment: 80 year old male with some to medical onychomycosis and new onset ecchymosis dorsal right foot and pain over second metatarsal.  Plan: -X-rays were obtained and reviewed of the right foot. -At this time due to the pain as well as the swelling and ecchymosis cannot rule out possible fracture/stress fracture. Recommend immobilization until repeat x-ray. He will proceed with a surgical shoe. At the next appointment we'll re-x-ray and reevaluate. -Nail sharply debrided 10 without complications.  -On  the left third digit over the area of mild localized erythema at discussed the patient closely monitor this area. Discussed with him that if there is any worsening of the redness, streaking or any drainage from the nail site to call immediately. At this time there is no drainage, increased warmth, edema, streaking in the area resolved when placing the foot into a dependent position. -Discussed possible noninvasive vascular testing. He states he previously had this done any has resulted home. Asked him to bring these results with him to the next appointment. -follow-up in 2 weeks for repeat x-ray right foot and for reevaluation. In the meantime, call the office with any questions, concerns, change in symptoms.

## 2014-03-23 ENCOUNTER — Ambulatory Visit: Payer: Medicare Other | Admitting: Podiatry

## 2014-04-26 ENCOUNTER — Other Ambulatory Visit: Payer: Self-pay | Admitting: Cardiovascular Disease

## 2014-05-24 ENCOUNTER — Ambulatory Visit (INDEPENDENT_AMBULATORY_CARE_PROVIDER_SITE_OTHER): Payer: Medicare Other | Admitting: Cardiovascular Disease

## 2014-05-24 ENCOUNTER — Encounter: Payer: Self-pay | Admitting: Cardiovascular Disease

## 2014-05-24 VITALS — BP 100/58 | HR 70 | Ht 71.0 in | Wt 214.5 lb

## 2014-05-24 DIAGNOSIS — I2510 Atherosclerotic heart disease of native coronary artery without angina pectoris: Secondary | ICD-10-CM

## 2014-05-24 DIAGNOSIS — I420 Dilated cardiomyopathy: Secondary | ICD-10-CM

## 2014-05-24 DIAGNOSIS — I4891 Unspecified atrial fibrillation: Secondary | ICD-10-CM

## 2014-05-24 DIAGNOSIS — E118 Type 2 diabetes mellitus with unspecified complications: Secondary | ICD-10-CM

## 2014-05-24 DIAGNOSIS — E785 Hyperlipidemia, unspecified: Secondary | ICD-10-CM

## 2014-05-24 DIAGNOSIS — I6523 Occlusion and stenosis of bilateral carotid arteries: Secondary | ICD-10-CM

## 2014-05-24 NOTE — Assessment & Plan Note (Signed)
Cholesterol is at goal on the current lipid regimen. No changes to the medications were made.  

## 2014-05-24 NOTE — Assessment & Plan Note (Signed)
Maintaining normal sinus rhythm. No changes to his medications. 

## 2014-05-24 NOTE — Assessment & Plan Note (Signed)
History of CEA on the left.  40 to 59% b/l carotid disease in 2014      

## 2014-05-24 NOTE — Assessment & Plan Note (Signed)
Hemoglobin A1c low 6 range We have encouraged continued  careful diet management

## 2014-05-24 NOTE — Assessment & Plan Note (Signed)
Appears relatively euvolemic on today's visit. Ejection fraction 40% in  2011.

## 2014-05-24 NOTE — Assessment & Plan Note (Signed)
Currently with no symptoms of angina. No further workup at this time. Continue current medication regimen. 

## 2014-05-24 NOTE — Patient Instructions (Signed)
You are doing well. No medication changes were made.  Please call us if you have new issues that need to be addressed before your next appt.  Your physician wants you to follow-up in: 6 months.  You will receive a reminder letter in the mail two months in advance. If you don't receive a letter, please call our office to schedule the follow-up appointment.   

## 2014-05-24 NOTE — Progress Notes (Signed)
Patient ID: Mark Dougherty, male    DOB: November 12, 1926, 81 y.o.   MRN: [id number]  HPI Comments: 81 year old with history of coronary artery disease, bypass surgery in 1996 in [place], peripheral vascular disease with CEA on the left in 2000 on the left, long history of diabetes, severe chronic back pain with MRI showing spinal stenosis, hyperlipidemia, with recent back surgery 4 to 5 years ago with complications including atrial fibrillation in the perioperative, postoperative period started on amiodarone with conversion to sinus rhythm, with a repeat admission to [facility] for dehydration, hypotension and malaise. He presents for routine followup of his coronary artery disease.  In follow-up today, his back continues to be a major issue. Hurts when he walks, better when he sits down He walks with a cane and a walker. No recent falls Denies any shortness of breath or chest pain with exertion Reports his diabetes is well controlled. Total cholesterol August 2015 was 130 He denies any lightheadedness or dizziness.  Denies any tachycardia concerning for atrial fibrillation. EKG on today's visit shows normal sinus rhythm with rate 70 bpm, interventricular conduction delay, unchanged from prior EKGs  Other past medical history  hospitalization in June 19th 2015 for weakness, cough, bronchitis, fall with left radial fracture. Noted to have low TSH, elevated free T4. Amiodarone was held. Also with mild renal insufficiency, elevated sugars. He was treated with antibiotics with improvement of his symptoms.  Previous episodes of COPD exacerbation in the hospital, he did improve with antibiotics and steroid taper  he uses a cane. History of falls in the past .  Total cholesterol 153 last year.   Notes indicate an ejection fraction of 40% in March of 2011 by echocardiogram.  MRI of his back shows severe spinal stenosis at L2 and L3, other foraminal stenoses of the lumbar region.  No Known  Allergies  Outpatient Encounter Prescriptions as of 05/24/2014  Medication Sig  . aspirin 81 MG EC tablet Take 81 mg by mouth daily.    . carvedilol (COREG) 3.125 MG tablet TAKE ONE TABLET BY MOUTH TWICE DAILY  . folic acid (FOLVITE) 1 MG tablet Take 1 mg by mouth daily.    . metFORMIN (GLUCOPHAGE) 500 MG tablet Take 500 mg by mouth 2 (two) times daily with a meal.    . simvastatin (ZOCOR) 20 MG tablet Take 1 tablet (20 mg total) by mouth at bedtime.  Marland Kitchen terazosin (HYTRIN) 1 MG capsule Take 1 mg by mouth at bedtime.    . vitamin B-12 (CYANOCOBALAMIN) 250 MCG tablet Take 250 mcg by mouth daily.    . [DISCONTINUED] glipiZIDE (GLUCOTROL) 5 MG tablet Take 5 mg by mouth daily.     Past Medical History  Diagnosis Date  . Diabetes mellitus   . Hyperlipidemia   . History of BPH   . ASHD (arteriosclerotic heart disease)     previous bypass  . Peripheral neuropathy   . Spinal stenosis   . Hypotension     mild  . S/P CABG x 3   . Left wrist fracture     Past Surgical History  Procedure Laterality Date  . Replacement total knee      left  . Coronary artery bypass graft  1996    x3  . Carotid endarterectomy      2010  . Back surgery  1975  . Eye surgery  2013  . Tooth extraction      Social History  reports that he has never smoked.  He does not have any smokeless tobacco history on file. He reports that he does not drink alcohol or use illicit drugs.  Family History Family history is unknown by patient.        Review of Systems  Constitutional: Negative.   Respiratory: Negative.   Cardiovascular: Negative.   Gastrointestinal: Negative.   Musculoskeletal: Positive for back pain and gait problem.  Neurological: Negative.   Hematological: Negative.   Psychiatric/Behavioral: Negative.   All other systems reviewed and are negative.   BP 100/58 mmHg  Pulse 70  Ht 5\' 11"  (1.803 m)  Wt 214 lb 8 oz (97.297 kg)  BMI 29.93 kg/m2  Physical Exam  Constitutional: He is  oriented to person, place, and time. He appears well-developed and well-nourished.  HENT:  Head: Normocephalic.  Nose: Nose normal.  Mouth/Throat: Oropharynx is clear and moist.  Eyes: Conjunctivae are normal. Pupils are equal, round, and reactive to light.  Neck: Normal range of motion. Neck supple. No JVD present.  Cardiovascular: Normal rate, regular rhythm, S1 normal, S2 normal and intact distal pulses.  Exam reveals no gallop and no friction rub.   Murmur heard.  Crescendo systolic murmur is present with a grade of 2/6  Pulmonary/Chest: Effort normal and breath sounds normal. No respiratory distress. He has no wheezes. He has no rales. He exhibits no tenderness.  Abdominal: Soft. Bowel sounds are normal. He exhibits no distension. There is no tenderness.  Musculoskeletal: Normal range of motion. He exhibits no edema or tenderness.  Lymphadenopathy:    He has no cervical adenopathy.  Neurological: He is alert and oriented to person, place, and time. Coordination normal.  Skin: Skin is warm and dry. No rash noted. No erythema.  Psychiatric: He has a normal mood and affect. His behavior is normal. Judgment and thought content normal.      Assessment and Plan   Nursing note and vitals reviewed.

## 2014-06-11 ENCOUNTER — Inpatient Hospital Stay: Payer: Self-pay | Admitting: Internal Medicine

## 2014-06-17 ENCOUNTER — Inpatient Hospital Stay: Payer: Self-pay | Admitting: Surgery

## 2014-06-22 DIAGNOSIS — I480 Paroxysmal atrial fibrillation: Secondary | ICD-10-CM

## 2014-06-22 DIAGNOSIS — R531 Weakness: Secondary | ICD-10-CM

## 2014-06-22 DIAGNOSIS — M150 Primary generalized (osteo)arthritis: Secondary | ICD-10-CM

## 2014-06-22 DIAGNOSIS — E119 Type 2 diabetes mellitus without complications: Secondary | ICD-10-CM

## 2014-06-22 DIAGNOSIS — K810 Acute cholecystitis: Secondary | ICD-10-CM

## 2014-06-22 DIAGNOSIS — I2510 Atherosclerotic heart disease of native coronary artery without angina pectoris: Secondary | ICD-10-CM

## 2014-07-01 DIAGNOSIS — I2510 Atherosclerotic heart disease of native coronary artery without angina pectoris: Secondary | ICD-10-CM

## 2014-07-01 DIAGNOSIS — R531 Weakness: Secondary | ICD-10-CM

## 2014-07-01 DIAGNOSIS — I480 Paroxysmal atrial fibrillation: Secondary | ICD-10-CM

## 2014-07-01 DIAGNOSIS — M150 Primary generalized (osteo)arthritis: Secondary | ICD-10-CM

## 2014-07-01 DIAGNOSIS — E119 Type 2 diabetes mellitus without complications: Secondary | ICD-10-CM

## 2014-07-08 ENCOUNTER — Ambulatory Visit: Payer: Medicare Other | Admitting: Podiatry

## 2014-07-22 ENCOUNTER — Ambulatory Visit (INDEPENDENT_AMBULATORY_CARE_PROVIDER_SITE_OTHER): Payer: Medicare Other | Admitting: Podiatry

## 2014-07-22 DIAGNOSIS — B351 Tinea unguium: Secondary | ICD-10-CM

## 2014-07-22 DIAGNOSIS — M79676 Pain in unspecified toe(s): Secondary | ICD-10-CM

## 2014-07-25 NOTE — Progress Notes (Signed)
Patient ID: Mark Dougherty, male   DOB: 1927-01-09, 81 y.o.   MRN: [id number]  Subjective: Mark Dougherty  presents the office today with painful, elongated toenails which he is unable to trim himself. Denies any redness or drainage around the nail sites. Since last appointment he states the ecchymosis over the right foot healed shortly after and he no longer had any problem with his right foot. No other complaints at this time and no acute changes since last appointment.   Objective: AAO 3, NAD DP/PT pulses decreased bilaterally, CRT less than 3 seconds Protective sensation decreased with Simms Weinstein monofilament, decreased vibratory sensation. Nails hypertrophic, dystrophic, elongated, brittle, discolored 10. There subjective tenderness over nails 1-5 bilaterally. There is no surrounding erythema or drainage from the nail sites. No other areas of tenderness to bilateral lower shoes. No overlying edema, erythema, increased warmth. No open lesions or pre-ulcer lesions identified bilaterally. No interdigital maceration. No pain with calf compression, swelling, warmth, erythema.  Assessment: Patient presents with symptomatic onychomycosis  Plan: -Treatment options discussed included alternatives, risks, complications. -Nail sharply debrided 10 without complication/bleeding. -Discussed the importance of daily foot inspection. -He was inquiring if we did diabetic inserts for his shoes. He uses another company previously went out of business. He states that he is due the next month for shoes. He will think about where he didn't get them from and if he wants [place] to do them I recommended him to call the office. Otherwise we can re-evaluated next appointment. -Follow-up in 3 months or sooner if any problems are to arise. In the meantime occurs to call the office with any questions, concerns, changes symptoms.

## 2014-08-21 NOTE — Consult Note (Signed)
Brief Consult Note: Diagnosis: nondisplaced left radius fracture.   Patient was seen by consultant.   Comments: Continue with immobilization. Will need follow-up appointment in 2 weeks.  Electronic Signatures: Donato Heinz (MD)  (Signed 20-Jun-15 08:04)  Authored: Brief Consult Note   Last Updated: 20-Jun-15 08:04 by Donato Heinz (MD)

## 2014-08-21 NOTE — Discharge Summary (Signed)
PATIENT NAME:  Mark Dougherty, Mark Dougherty MR#:  [id number] DATE OF BIRTH:  1926/11/10  DATE OF ADMISSION:  10/16/2013 DATE OF DISCHARGE:  10/19/2013  ADMISSION DIAGNOSES: 1.  Chronic obstructive pulmonary disease exacerbation.  2.  Radial fracture after a mechanical fall.   DISCHARGE DIAGNOSES: 1.  Acute chronic obstructive pulmonary disease exacerbation.  2.  Acute bronchitis.  3.  Acute conjunctivitis.  4.  Acute kidney injury.  5.  Diabetes.  6.  Left radial fracture.  7.  Generalized weakness. 8.  Hypothyroidism.   CONSULTATIONS: Dr. Ernest Pine.   PERTINENT LABORATORIES AT DISCHARGE: Free T4 was 1.49. TSH was 0.129.    Hemoglobin A1c is 6.4.   HOSPITAL COURSE:  This is an 81 year old male who was admitted on June 19th with shortness of breath, cough, and recurrent falls. For further details, please refer to the H and P.  1.  Acute COPD exacerbation. The patient was noted to have wheezing on admission and had some hypoxia. He was treated for acute COPD exacerbation, improved with IV steroids. He is on a steroid taper with some antibiotics for his acute bronchitis. He is off his oxygen.  2.  Acute bronchitis. The patient was on Levaquin. He has improved.  3.  Acute conjunctivitis, likely viral but the patient was started on gentamicin eye drops. 4.  Acute kidney injury, resolved with IV fluids. 5.  Diabetes. A1c is 6.4. The patient will resume his home medications. 6.  Left radius fracture. The patient has a cast. Appreciate Dr. Elenor Legato consult. The patient will be seen in 2 weeks by Dr. Ernest Pine. 7.  Generalized weakness. The patient presented with some falls. He will need home health care and a walker at discharge.  8.  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 The patient's TSH was low. T4 was elevated. He needs repeat TFTs in 4 weeks. We are holding amiodarone, and he will have follow-up with endocrinology in a week.   DISCHARGE MEDICATIONS: 1.  Aspirin 81 mg daily. 2.  Folic acid 1  tablet daily.  3.  Zocor 20 mg at bedtime.  4.  Terazosin 1 mg at bedtime.  5.  Vitamin B12 250 mcg daily.  6.  Coreg 6.25 b.i.d.  7.  Glucotrol 5 mg b.i.d.  8.  Glucophage 500 mg b.i.d.  9.  Gentamicin eye drops to effected eye every 6 hours x5 days.  10.  Prednisone 10 mg x1 day daily then stop. 11.  Levaquin 500 mg x5 days, q. 24 hours. 12.  Percocet 5/325 mg q. 6 hours p.r.n.  13.  MiraLax 17 grams daily.  14.  Tussionex 5 mL q. 12 hours p.r.n.   NOTE: The patient will hold Pacerone/amiodarone until seen by his cardiologist.   DISCHARGE HOME HEALTH: Physical therapy and nurse.   DISCHARGE DIET: Low sodium, ADA diet.   DISCHARGE ACTIVITY: As tolerated.   DISCHARGE FOLLOWUP: The patient will follow up with Dr. Tedd Sias in 1 week, Dr. Francesco Sor in 2 weeks, and Dr. Dewaine Oats in 1 week, as well as his cardiologist to discuss amiodarone.  The patient is medically stable for discharge.  TIME SPENT: 35 minutes.  ____________________________ Janyth Contes. Juliene Pina, MD spm:sb D: 10/19/2013 13:21:33 ET T: 10/19/2013 13:39:40 ET JOB#: [id number]  cc: Jovanka Westgate P. Juliene Pina, MD, <Dictator> A. Wendall Mola, MD Illene Labrador. Angie Fava., MD Jillene Bucks. Arlana Pouch, MD Janyth Contes Zayna Toste MD ELECTRONICALLY SIGNED 10/19/2013 14:12

## 2014-08-21 NOTE — H&P (Signed)
PATIENT NAME:  Mark Dougherty, Mark Dougherty MR#:  [id number] DATE OF BIRTH:  12-17-1926  DATE OF ADMISSION:  10/16/2013  PRIMARY CARE PHYSICIAN: Dr. Dewaine Oats.   CHIEF COMPLAINT: Shortness of breath, cough and recurrent falls.   HISTORY OF PRESENT ILLNESS: This is an 81 year old male who presents to the Emergency Room after suffering a fall earlier today. As per the patient and the wife, the patient has fallen about 3 times in the past 3 days. He normally uses a walker to get around, but his legs keep giving way and he keeps falling down. He presented today and was noted to have a distal left radial fracture. The patient also has been having upper respiratory infection symptoms like shortness of breath and cough, which has been progressive and worse over the past week. The cough has been productive. He has had low-grade fevers, but no chest pain. No nausea. No vomiting. No diarrhea. No abdominal pain. No other associated symptoms. The patient, therefore, presented to the hospital with the above complaints as his symptoms were not improving.   REVIEW OF SYSTEMS: CONSTITUTIONAL: No documented fever. Positive fatigue and weakness. No weight gain. No weight loss.  EYES: No blurred or double vision.  ENT: No tinnitus. No postnasal drip. No redness of the oropharynx.  RESPIRATORY: Positive cough. No wheeze. No hemoptysis. Positive dyspnea.  CARDIOVASCULAR: No chest pain. No orthopnea. No palpitations or syncope.  GASTROINTESTINAL: No nausea. No vomiting or diarrhea. No abdominal pain. No melena or hematochezia.  GENITOURINARY: No dysuria or hematuria.  ENDOCRINE: No polyuria or nocturia.   No cold intolerance.  HEMATOLOGIC: No anemia. No bruising. No bleeding.  INTEGUMENTARY: No rashes. No lesions.  MUSCULOSKELETAL: No arthritis. No swelling. No gout.  NEUROLOGIC: No numbness or tingling. No ataxia. No seizure-type activity.  PSYCHIATRIC: No anxiety. No insomnia. No ADD.   PAST MEDICAL HISTORY: Consistent with  history of coronary artery disease, status post bypass, hypertension, diabetes, and BPH.   ALLERGIES: NO KNOWN DRUG ALLERGIES.   SOCIAL HISTORY: No smoking. No alcohol abuse. No illicit drug abuse. Lives at home with his wife.   FAMILY HISTORY: Mother and father are both deceased. Mother was obese, died from complications of diabetes. He cannot recall what his father died from.   CURRENT MEDICATIONS: As follows: Aspirin 81 mg daily, Tessalon Perles 100 mg 1 tab t.i.d. as needed, Coreg 6.25 mg b.i.d., folic acid 1 mg daily, Glucophage 5 mg b.i.d., Glucotrol XL 5 mg b.i.d., amiodarone 200 mg daily, terazosin 1 mg 1 tab daily, vitamin B12 at 250 mcg daily, and Zocor 20 mg daily.   PHYSICAL EXAMINATION: Presently is as follows:  VITAL SIGNS: Temperature 98, pulse 86, respirations 18, blood pressure 130/67, saturations 86% on room air.  GENERAL: He is a pleasant -appearing male in mild respiratory distress.  HEENT:  He is atraumatic, normocephalic. His extraocular muscles are intact. His pupils are equal and reactive to light. His sclerae are anicteric. No conjunctival injection. No pharyngeal erythema.  NECK: Supple. There is no jugular venous distention. No bruits, no lymphadenopathy, no thyromegaly.  HEART: Regular rate and rhythm. No murmurs. No rubs, no clicks.  LUNGS: He has some diffuse rhonchi on the lower bases, but negative use of accessory muscles. No dullness to percussion. No rales. No wheezing.  ABDOMEN: Soft, flat, nontender, nondistended. Has good bowel sounds. No hepatosplenomegaly appreciated.  EXTREMITIES: No evidence of any cyanosis, clubbing, or peripheral edema. Has +2 pedal and radial pulses bilaterally.  NEUROLOGIC: The patient is alert, awake,  and oriented x 3 with no focal motor or sensory deficits appreciated bilaterally.  SKIN: Moist and warm with no rashes appreciated.  LYMPHATIC: There is no cervical or axillary lymphadenopathy.   LABORATORY DATA: Serum glucose of 117,  BUN 30, creatinine 1.4, sodium 136, potassium 4, chloride 103, bicarbonate 26. The patient's LFTs are within normal limits. Troponin less than 0.02. White cell count 14.2, hemoglobin 12.0, hematocrit 36.5, platelet count 339,000. Urinalysis is within normal limits.   The patient did have a CT of the head done without contrast, which showed atrophy with mild periventricular small vessel disease. No acute intracranial mass, hemorrhage, or acute infarct. The patient also had a chest x-ray done which showed no evidence of acute cardiopulmonary disease. Also had an x-ray of the left wrist which showed a subtle incomplete fracture of the distal radial metaphysis with extension into the radiocarpal joint.   ASSESSMENT AND PLAN: This is an 81 year old male with a history of coronary artery disease, status post bypass, hypertension, diabetes, and benign prostatic hypertrophy, who presents to the hospital due to shortness of breath and cough for the past week and also noted to have 3 falls in the past 3 days and noted to have a left distal radial fracture.   1.  Acute bronchitis. This is likely the cause of the patient's worsening shortness of breath and cough. A chest x-ray on admission is actually negative for pneumonia. He has no history of previous chronic obstructive pulmonary disease or underlying pulmonary process. I will treat the patient with IV Levaquin. Follow sputum and blood cultures. Also place him on a prednisone taper, place him on q. hour DuoNeb and follow clinically.   2.  Left distal radial fracture. This is secondary to the recurrent falls he has been having. He has been splinted already in the Emergency Room. I will continue pain control with some Norco and get an orthopedic consult.   3.  History of recurrent falls. Exact etiology of this is unclear, but suspected to be due to deconditioning and also complicated with underlying respiratory illness. I will get a physical therapy consult to  assess his mobility.   4.  History of coronary artery disease, status post bypass. The patient has no acute chest pain. His first set of cardiac markers is negative. I will continue his aspirin, statin, and beta blocker for now.  5.  Diabetes. The patient's p.o. intake has been poor, therefore, I will hold his Glucophage and Glucotrol for now, place him on sliding scale insulin, continue carbohydrate -controlled diet.   6.  Benign prostatic hypertrophy. Continue to terazosin.  7.  Hyperlipidemia. Continue simvastatin.   8.  CODE STATUS: THE PATIENT IS A FULL CODE.   TIME SPENT: 50 minutes.    ____________________________ Rolly Pancake. Cherlynn Kaiser, MD vjs:ts D: 10/16/2013 10:57:12 ET T: 10/16/2013 11:22:25 ET JOB#: [id number]  cc: Rolly Pancake. Cherlynn Kaiser, MD, <Dictator> Houston Siren MD ELECTRONICALLY SIGNED 10/27/2013 20:31

## 2014-08-21 NOTE — Consult Note (Signed)
PATIENT NAME:  Mark Dougherty, Mark Dougherty MR#:  [id number] DATE OF BIRTH:  07/07/1926  DATE OF CONSULTATION:  10/19/2013  CONSULTING PHYSICIAN:  A. Wendall Mola, MD  REQUESTING PROVIDER: Adrian Saran, MD  PRIMARY CARE PROVIDER: Dewaine Oats, MD  CHIEF COMPLAINT: Hyperthyroidism.   HISTORY OF PRESENT ILLNESS: This is an 81 year old male seen in consultation for abnormal thyroid function tests. The patient was admitted on June 19 after a fall, with complaints of shortness of breath. He has been found to have a left radial fracture, and is being treated for bronchitis/COPD exacerbation. Of note, the patient was found to have abnormal thyroid function tests, specifically with a TSH level which was low on 10/17/2013 at 0.129 uIU/mL, and an elevated free T4 level of 1.49 ng/dL. The patient denies a history of thyroid dysfunction. He denies any neck pain or discomfort. He has not had any known recent exposure to iodinated contrast dye. He had been taking amiodarone at the time of admission. However, now that medication has been held. The patient is somewhat of a poor historian. He cannot explain why he takes amiodarone or how long he has been taking it. He does follow with Dr. Mariah Milling, Cardiology. There has been no known recent exposure to lithium. He denies heat intolerance. He denies tremor. He denies heart racing or palpitations. Denies recent unintentional weight loss.   MEDICAL HISTORY: 1.  Coronary artery disease, status post coronary artery bypass graft.  2.  Hypertension.  3.  Diabetes mellitus.  4.  Benign prostatic hypertrophy.  5.  Hyperlipidemia.   PAST SURGICAL HISTORY: Coronary artery bypass graft.   SOCIAL HISTORY: The patient lives with his wife. She is at the bedside. He does not smoke or drink alcohol.   FAMILY HISTORY: No known thyroid disease.   REVIEW OF SYSTEMS:  GENERAL: Denies weight loss. Denies fever.  HEENT: Denies blurred vision or sore throat.  NECK: Denies neck pain or discomfort.  Denies dysphagia.  CARDIAC: Denies chest pain or palpitation.  PULMONARY: Reports shortness of breath has resolved. He has a persistent cough.  ABDOMEN: Denies abdominal pain or nausea.  EXTREMITIES: Denies leg swelling. Denies tremor.  NEUROLOGIC: He has had recent falls. Denies headache.  ENDOCRINE: Denies heat or cold intolerance.   PHYSICAL EXAMINATION: VITAL SIGNS: Height 70.9 inches, weight 208 pounds, temp 98.0, pulse 59, respirations 18, blood pressure 156/75, pulse ox 96% on room air.  GENERAL: Elderly white male in no acute distress.  HEENT: No proptosis, lid lag, or stare. Oropharynx is clear.  NECK: Supple. No appreciable thyromegaly. No palpable thyroid nodules.  CARDIAC: Regular rate and rhythm.  PULMONARY: Clear to auscultation bilaterally. No wheeze.  EXTREMITIES: Left upper extremity has been splinted and dressed. No lower extremity edema is present.  PSYCHIATRIC: Alert and oriented. Calm, cooperative.  NEUROLOGIC: Fine tremor of the right outstretched hand.   LABORATORIES: As per HPI. In addition, WBC normal at 8.2, hemoglobin A1c 6.4%.   ASSESSMENT: An 81 year old male with low TSH and elevated free T4, consistent with hyperthyroidism. Potential causes of hyperthyroidism in this individual may include autoimmune thyroid disease, toxic adenoma or toxic multinodular goiter, silent thyroiditis, or amiodarone-induced thyroiditis.   RECOMMENDATIONS: 1.  Amiodarone has been held. The patient was encouraged to see Dr. Mariah Milling, Cardiology, and follow up promptly.  2.  As patient plans to be discharged, I will defer further workup of hyperthyroidism to outpatient setting. I anticipate obtaining additional lab work to include T3 level and thyroid autoantibodies. It also may be helpful  to obtain a thyroid uptake/scan.  3.  Will defer treatment of hyperthyroidism at this time.   I will have the patient be contacted for a followup in 1 to 2 weeks.     ____________________________ A. Wendall Mola, MD ams:mr D: 10/19/2013 20:31:00 ET T: 10/19/2013 20:53:30 ET JOB#: [id number]  cc: A. Wendall Mola, MD, <Dictator> Jillene Bucks. Arlana Pouch, MD Antonieta Iba, MD   Caleen Jobs SOLUM MD ELECTRONICALLY SIGNED 10/28/2013 17:26

## 2014-08-29 NOTE — H&P (Signed)
Subjective/Chief Complaint I feel weak and should not have been discharged from hospital this weekend   History of Present Illness 81 y/o male with CAD, HTN, type 2 diabetes recently [facility] 2/12 through 2/14 with FTT and weakness following a fall out of bed.    Workup negative, seen by PT and discharged home.  Over last two days admits to more weakness and "driving his wife Crazy".  Too weak to get from bed to toilet.  Brought to ER by wife for assistance.  Denies nausea, voliting, diarrhea, fevers or jaundice.  admits to anorexia only.  Some chronic back pain,   Desired to be DNR last visit.   Past History CAD s/p CABG HTN DM   Past Medical Health Coronary Artery Disease, Hypertension, Diabetes Mellitus   Code Status DNR   Past Med/Surgical Hx:  htn:   Diabetes:   fracture:   Back Surgery:   Knee Surgery - Left:   CABG (Coronary Artery Bypass Graft):   ALLERGIES:  lidocaine topical: Hallucinations   Other Allergies none   HOME MEDICATIONS: Medication Instructions Status  aspirin 81 mg oral tablet 1 tab(s) orally once a day Active  folic acid 1 mg oral tablet 1 tab(s) orally once a day Active  Zocor 20 mg oral tablet 1 tab(s) orally once a day (at bedtime) (10pm) Active  Vitamin B-12 250 mcg oral tablet 1 tab(s) orally once a day (12pm) Active  Coreg 6.25 mg oral tablet 1 tab(s) orally 2 times a day (10am, 6pm) Active  metFORMIN 500 mg oral tablet 1 tab(s) orally 2 times a day Active    Medications none   Family and Social History:  Social History negative tobacco, negative ETOH, negative Illicit drugs   Place of Living Home   Review of Systems:  Subjective/Chief Complaint see above, remaining 10 point review negative except for weakness, back pain, recent fall   Fever/Chills No   Cough No   Sputum No   Abdominal Pain No   Diarrhea No   Constipation No   Nausea/Vomiting No   SOB/DOE No   Chest Pain No   Dysuria No    Tolerating Diet anorexia.   Medications/Allergies Reviewed Medications/Allergies reviewed   Physical Exam:  GEN no acute distress, obese, disheveled   HEENT pale conjunctivae, PERRL   NECK supple   RESP normal resp effort  clear BS   CARD regular rate   ABD denies tenderness  no liver/spleen enlargement  no hernia  soft  normal BS   LYMPH negative neck   EXTR negative cyanosis/clubbing, negative edema   SKIN normal to palpation, No rashes   NEURO cranial nerves intact   PSYCH A+O to time, place, person, good insight   Lab Results: Hepatic:  18-Feb-16 09:51   Bilirubin, Total 0.8  Alkaline Phosphatase  156  SGPT (ALT) 39  SGOT (AST)  62  Total Protein, Serum 7.1  Albumin, Serum  2.1  Routine Chem:  18-Feb-16 09:51   Glucose, Serum  137  BUN  30  Creatinine (comp)  1.46  Sodium, Serum 137  Potassium, Serum 3.7  Chloride, Serum 104  CO2, Serum 26  Calcium (Total), Serum 8.7  Osmolality (calc) 282  eGFR (African American)  59  eGFR (Non-African American)  49 (eGFR values <65m/min/1.73 m2 may be an indication of chronic kidney disease (CKD). Calculated eGFR, using the MRDR Study equation, is useful in  patients with stable renal function. The eGFR calculation will not  be reliable in acutely ill patients when serum creatinine is changing rapidly. It is not useful in patients on dialysis. The eGFR calculation may not be applicable to patients at the low and high extremes of body sizes, pregnant women, and vegetarians.)  Anion Gap 7  Lipase 170 (Result(s) reported on 17 Jun 2014 at 10:27AM.)  Cardiac:  18-Feb-16 09:51   Troponin I < 0.02 (0.00-0.05 0.05 ng/mL or less: NEGATIVE  Repeat testing in 3-6 hrs  if clinically indicated. >0.05 ng/mL: POTENTIAL  MYOCARDIAL INJURY. Repeat  testing in 3-6 hrs if  clinically indicated. NOTE: An increase or decrease  of 30% or more on serial  testing suggests a  clinically important change)  Routine UA:   18-Feb-16 10:50   Color (UA) Amber  Clarity (UA) Hazy  Glucose (UA) Negative  Bilirubin (UA) Negative  Ketones (UA) Negative  Specific Gravity (UA) 1.025  Blood (UA) 1+  pH (UA) 5.0  Protein (UA) 30 mg/dL  Nitrite (UA) Negative  Leukocyte Esterase (UA) Negative (Result(s) reported on 17 Jun 2014 at 11:23AM.)  RBC (UA) 4 /HPF  WBC (UA) 2 /HPF  Bacteria (UA) NONE SEEN  Epithelial Cells (UA) 1 /HPF  Mucous (UA) PRESENT  Budding Yeast (UA) PRESENT  Hyaline Cast (UA) 8 /LPF  Granular Cast (UA) 3 /LPF  Amorphous Crystal (UA) PRESENT (Result(s) reported on 17 Jun 2014 at 11:23AM.)  Routine Hem:  18-Feb-16 09:51   WBC (CBC)  17.4  RBC (CBC)  3.58  Hemoglobin (CBC)  11.0  Hematocrit (CBC)  34.1  Platelet Count (CBC) 300  MCV 95  MCH 30.6  MCHC 32.1  RDW 13.0  Neutrophil % 72.6  Lymphocyte % 15.3  Monocyte % 11.7  Eosinophil % 0.0  Basophil % 0.4  Neutrophil #  12.6  Lymphocyte # 2.7  Monocyte #  2.0  Eosinophil # 0.0  Basophil # 0.1 (Result(s) reported on 17 Jun 2014 at 10:31AM.)   Radiology Results: XRay:    18-Feb-16 09:48, Chest PA and Lateral  Chest PA and Lateral  REASON FOR EXAM:    weakness  COMMENTS:       PROCEDURE: DXR - DXR CHEST PA (OR AP) AND LATERAL  - Jun 17 2014  9:48AM     CLINICAL DATA:  General weakness for 3 weeks. History of diabetes  and hypertension.    EXAM:  CHEST  2 VIEW    COMPARISON:  10/16/2013    FINDINGS:  Prior median sternotomy. Heart size is normal. Aorta is tortuous.  There is elevation of right hemidiaphragm. Mildly prominent markings  at the left lung base have been stable over time consistent with  atelectasis or scarring. There are no new consolidations or pleural  effusions. Degenerative changes are seen in the spine.     IMPRESSION:  1. Stable elevation of right hemidiaphragm.  2.  No evidence for acute  abnormality.      Electronically Signed    By: Talbert Cage.D.    On: 06/17/2014 09:53        Verified By: Glenice Bow, M.D.,  [place]:    18-Feb-16 10:51, US Abdomen General Survey  US Abdomen General Survey  REASON FOR EXAM:    RUQ pain  COMMENTS:   May transport without cardiac monitor    PROCEDURE: [place]  - US ABDOMEN GENERAL SURVEY  - Jun 17 2014 10:51AM     CLINICAL DATA:  Right upper quadrant pain starting Sunday    EXAM:  ULTRASOUND ABDOMEN  COMPLETE    COMPARISON:  None.    FINDINGS:  Gallbladder: No shadowing gallstones are noted within gallbladder.  There is abnormal focal thickening of gallbladder wall in fundal  region with septation and probable inspissated sludge. Although  there is no sonographic Murphy's sign focal inflammation,  adenomyomatosis or less likely neoplastic process cannot be  excluded. Clinical correlation is necessary. Further evaluation with  enhanced CT or MRI could be performed as clinically warranted.  Gallbladder wall in fundal region measures 7 mm in thickness.    Common bile duct: Diameter: 2 mm in diameter within normal limits.    Liver: No focal lesion identified. Within normal limits in  parenchymal echogenicity. Limited visualization of left hepatic  lobe.    IVC: No abnormality visualized.  Pancreas: Not visualized due to abundant bowel gas.    Spleen: Size and appearance within normal limits.    Right Kidney: Length: 10.7 cm. Echogenicity within normal limits. No  mass or hydronephrosis visualized.    Left Kidney: Length: 10.7 cm. Echogenicity within normal limits. No  mass or hydronephrosis visualized.    Abdominal aorta: No aneurysm visualized. Measures up to 2.8 cm in  diameter.    Other findings: None.   IMPRESSION:  1. No shadowing gallstones are noted within gallbladder. There is  abnormal focal thickening of gallbladder wall in fundal region with  septation and probable inspissated sludge. Although there is no  sonographic Murphy's sign focal inflammation, adenomyomatosis or  less likely neoplastic  process cannot be excluded. Clinical  correlation is necessary. Further evaluation with enhanced CT or MRI  could be performed as clinically warranted. Gallbladder wall in  fundal region measures 7 mm in thickness.  2. Normal CBD.  3. The pancreas is not visualized due to abundant bowel gas. Limited  assessment of left hepatic lobe due to bowel gas.  4. No hydronephrosis.  5. No aortic aneurysm.  Electronically Signed    By: Lahoma Crocker M.D.    On: 06/17/2014 11:09         Verified By: Ephraim Hamburger, M.D.,  [place]:    18-Feb-16 09:48, Chest PA and Lateral  PACS Image    18-Feb-16 10:51, US Abdomen General Survey  PACS Image    Assessment/Admission Diagnosis 81 y/o recently discharged from hospital with weakness, elevated creatinine returns with same and new leukocytosis and abnormal LFT's  Sludge seen on [place], Exam benign.  Overall poor surgical candidate with FTT/significant weakness.  Not clearly acute cholecystitis by exam or history.  no clear explanation for serum abnormalities.  personally reviewed old medical records, labs and [place], hydrate iv unasyn empirically HIDA, if negative ct scan scan abd/pelvis as part of metastatic workup. scan medicine consult.   Electronic Signatures: Sherri Rad (MD)  (Signed 18-Feb-16 13:24)  Authored: CHIEF COMPLAINT and HISTORY, PAST MEDICAL/SURGIAL HISTORY, ALLERGIES, Other Allergies, HOME MEDICATIONS, OTHER MEDICATIONS, FAMILY AND SOCIAL HISTORY, REVIEW OF SYSTEMS, PHYSICAL EXAM, LABS, Radiology, ASSESSMENT AND PLAN   Last Updated: 18-Feb-16 13:24 by Sherri Rad (MD)

## 2014-08-29 NOTE — H&P (Signed)
PATIENT NAME:  Mark Dougherty, Mark Dougherty MR#:  [id number] DATE OF BIRTH:  07/21/1926  DATE OF ADMISSION:  06/11/2014  PRIMARY CARE PHYSICIAN: Katherina Right C. Arlana Pouch, MD  REFERRING PHYSICIAN: Darien Ramus, MD  CHIEF COMPLAINT: Fall and weakness for the past 3 days.   HISTORY OF PRESENT ILLNESS: An 81 year old Caucasian male with a history of hypertension, diabetes, was sent from home due to weakness and fall for the past 3 days. The patient is alert, awake, oriented, in no acute distress. According to the patient and the patient's wife, the patient's general condition has been declining for the past 1 week. The patient started to have generalized weakness 3 days ago. The patient feels weak, lethargic, and also falls every day for the past 3 days. The patient denies any fever or chills. No abdominal pain, nausea, vomiting, or diarrhea but has constipation for the past 8 days. The patient denies any other symptoms but he said he his sugar was very well controlled, but for the past 1 week the patient's sugar was low at 44 and then up to 260s. The patient also complains of polyuria and urinary frequency but no dysuria or hematuria.   PAST MEDICAL HISTORY: Hypertension, diabetes, CAD status post CABG, BPH.    SOCIAL HISTORY: No smoking or drinking or illicit drugs. Lives at home with his wife.   PAST SURGICAL HISTORY: CABG, back surgery twice, left knee surgery,  left distal radial fracture with a splint.   FAMILY HISTORY: Mother and father are both deceased. Mother was obese; died from complication of diabetes.   ALLERGIES: None.   HOME MEDICATIONS: Zocor 20 mg p.o. at bedtime, vitamin B12 at 250 mcg p.o. daily, terazosin 1 mg 2 capsules p.o. at bedtime, Glucotrol XL 5 mg p.o. b.i.d., Glucophage 500 mg p.o. b.i.d., folic acid 1 mg p.o. daily, Coreg 6.25 mg p.o. twice a day, aspirin 81 mg p.o. daily.   REVIEW OF SYSTEMS:  CONSTITUTIONAL: The patient denies any fever or chills. No headache or dizziness, but has  generalized weakness.  EYES: No double vision, blurred vision.  EARS, NOSE, AND THROAT: No postnasal drip, slurred speech, or dysphagia.  CARDIOVASCULAR: No chest pain, palpitation, orthopnea, or nocturnal dyspnea. No leg edema.  PULMONARY: No cough, sputum, shortness of breath, or hematemesis.  GASTROINTESTINAL: No abdominal pain, nausea, vomiting. No diarrhea. No melena or bloody stool, but has constipation for 8 days.  GENITOURINARY: No dysuria, hematuria, or incontinence, but has urine frequency.  NEUROLOGY: No syncope, loss of consciousness, or seizure.  ENDOCRINOLOGY: Positive for polyuria. No polydipsia, heat or cold intolerance.  HEMATOLOGY: No easy bruising or bleeding.   PHYSICAL EXAMINATION:  VITAL SIGNS: Temperature 98.3, blood pressure 122/89, pulse 74, oxygen saturation 100% on room air.  GENERAL: The patient is alert, awake, oriented, in no acute distress.  HEENT: Pupils round, equal, and reactive to light and accommodation. Moist oral mucosa. Clear oropharynx.  NECK: Supple. No JVD or carotid bruit. No lymphadenopathy. No thyromegaly.  CARDIOVASCULAR: S1 and S2, regular rate and rhythm. No murmurs or gallops.  PULMONARY: Bilateral air entry. No wheezing or rales. No use of accessory muscle to breathe.  ABDOMEN: Soft. No distention, but has some mild tenderness. No rigidity. No rebound. Bowel sounds present. No organomegaly.  EXTREMITIES: No edema, clubbing, or cyanosis. No calf tenderness. Bilateral pedal pulses present.  SKIN: No rash or jaundice.  NEUROLOGIC: A and O x 3. No focal deficit. Power 3-4/5. Sensation intact.   LABORATORY DATA: ABG showed pH 7.41,  pO2 of 70, pCO2 of 39, lactic acid 0.9. CK 0.7, glucose 134, BUN 31, creatinine 1.37. Electrolytes normal. WBC 12.2, hemoglobin 12.1, platelet 206,000. Troponin less than 0.02. CAT scan of head showed left sphenoid sinus disease, no acute intracranial abnormality. Cervical spine CT showed degenerative changes without  acute fracture. EKG showed sinus rhythm at 76 BPM with left anterior fascicular block.   IMPRESSIONS:  1.  Acute renal failure with dehydration.  2.  Generalized weakness.  3.  Fall.  4.  Hypertension.  5.  Diabetes.  6.  Coronary artery disease.  7.  Benign prostatic hypertrophy.  8.  Constipation.   PLAN OF TREATMENT:  1.  The patient will be admitted to medical floor. We will start normal saline IV and follow up BMP.  2.  For fall and generalized weakness, we will get a physical therapy evaluation, and social worker consult for possible subacute rehabilitation placement.  3.  Fall and aspiration precautions.  4.  For hypertension, we will continue Coreg.  5.  For diabetes, we will start a sliding scale but hold p.o. metformin and Glucotrol.  6.  For CAD, continue aspirin and Zocor.  7.  I discussed the patient's condition and plan of treatment with the patient and the patient's wife. The patient said that he has living will with DNR status.   TIME SPENT: About 52 minutes.    ____________________________ Shaune Pollack, MD qc:bm D: 06/11/2014 21:50:52 ET T: 06/11/2014 23:03:32 ET JOB#: [id number]  cc: Shaune Pollack, MD, <Dictator> Shaune Pollack MD ELECTRONICALLY SIGNED 06/12/2014 15:19

## 2014-08-29 NOTE — Discharge Summary (Signed)
PATIENT NAME:  Mark Dougherty, Mark Dougherty MR#:  [id number] DATE OF BIRTH:  1926/07/12  DATE OF ADMISSION:  06/17/2014 DATE OF DISCHARGE:  06/21/2014  PRINCIPAL DIAGNOSIS: Failure to thrive.   OTHER DIAGNOSES:  1.  Deconditioning with gait abnormality.  2.  Hypertension.  3.  Diabetes mellitus.  4.  Status post coronary artery bypass graft in the 1970s.  5.  Benign prostatic hypertrophy.  6.  Chronic kidney disease, stage 3.   HOSPITAL COURSE: The patient was admitted to the hospital on the surgical service because he had gallstones. These were absolutely asymptomatic and the patient was tolerating a diet. It was determined that he wanted to go home but his wife wanted rehabilitation therapy. There were no surgical indications and therefore he was discharged to a skilled nursing facility.  DISCHARGE MEDICATIONS: At the time of discharge, his medications were as follows: Coreg 6.25 mg p.o. b.i.d., vitamin B12 cyanocobalamin 1000 mcg p.o. daily, folic acid 1 mg p.o. daily, pantoprazole 40 mg p.o. daily at 6:00 a.m.,  Zocor 20 mg p.o. at bedtime, aspirin 81 mg p.o. daily, metformin 500 mg p.o. b.i.d. with meals.  FOLLOWUP: He was given no surgical followup as he does not have an acute surgical care problem.   ____________________________ Claude Manges, MD wfm:ST D: 06/21/2014 14:38:45 ET T: 06/21/2014 14:47:22 ET JOB#: [id number]  cc: Claude Manges, MD, <Dictator> Claude Manges MD ELECTRONICALLY SIGNED 06/21/2014 16:39

## 2014-08-29 NOTE — Consult Note (Signed)
PATIENT NAME:  Mark Dougherty, Mark Dougherty MR#:  [id number] DATE OF BIRTH:  [birth year]  DATE OF CONSULTATION:  06/17/2014  CONSULTING PHYSICIAN:  Jaheem Hedgepath R. Gates Jividen, MD  REQUESTING PHYSICIAN: Chaney Malling, MD of the Emergency Room.  REASON FOR CONSULTATION: Abdominal pain, deconditioning, and falls.   HISTORY OF PRESENTING ILLNESS:  This is an 81 year old Caucasian male patient who was recently admitted and discharged on 06/13/2014 after being treated for dehydration and recurrent falls that seemed to have progressively worsened with his weakness, unable to ambulate well and returned to the Emergency Room. He does have history of CABG and CKD which seemed to be stable at this point.   He mentions that over the last 10 days he has progressively weakened at baseline. He uses either a cane or a walker to ambulate on his own, but has had recurrent falls due to generalized weakness. When he returned on February 14 the patient was unable to walk as per family at bedside. Today he does have some pain in his right upper quadrant area although does not complain of any nausea or vomiting. He does have constipation with 1 bowel movement in the last 10 days which was 3 days prior and normal. No melena and no blood.   Today in the Emergency Room, the patient's ultrasound of the right upper quadrant shows some gallbladder sludge along with gallbladder fundus wall thickening. White count is elevated at 17,000.   PAST MEDICAL HISTORY:  1.  Hypertension.  2.  Diabetes.  3.  Coronary artery disease status post CABG in 1970s.  4.  Benign prostatic hypertrophy.  5.  CKD stage III.  6.  Gait abnormalities and uses a walker.   SOCIAL HISTORY: The patient does not smoke. No alcohol. No illicit drugs. He lives at home with his wife. He ambulates with a walker.   CODE STATUS: DNR/DNI.   PAST SURGICAL HISTORY:  1.  CABG.  2.  Back surgeries.  3.  Left knee replacement.   FAMILY HISTORY: Mother and father are deceased. Mother  had diabetes.   ALLERGIES: No known drug allergies.   HOME MEDICATIONS:  1.  Aspirin 81 mg daily.  2.  Coreg 6.25 mg oral 2 times a day. 3.  Folic acid 1 mg oral once a day.  4.  Metformin 5 mg oral 2 times a day.  5.  Vitamin B12 250 mcg daily.  6.  Zocor 20 mg daily.   REVIEW OF SYSTEMS:  CONSTITUTIONAL: Complains of fatigue, weakness but no weight loss.  EYES: No blurred vision, pain, or redness.  EARS, NOSE AND THROAT: No tinnitus, ear pain, or hearing loss.  RESPIRATORY: No cough, wheeze, or hemoptysis.  CARDIOVASCULAR: No chest pain, orthopnea, or edema.  GASTROINTESTINAL: No nausea, vomiting, or diarrhea. He does have right upper quadrant pain.  GENITOURINARY: No dysuria, hematuria, or frequency.  ENDOCRINE: No polyuria, nocturia, thyroid problems.  HEMATOLOGIC AND LYMPHATIC: No anemia, easy bruising, or bleeding.  INTEGUMENTARY:  No acne, rash, or lesion.  MUSCULOSKELETAL: Denies arthritis.  NEUROLOGIC: No focal numbness or weakness.  PSYCHIATRIC: No anxiety or depression.   PHYSICAL EXAMINATION:  VITAL SIGNS: Showed temperature 97.6, pulse 78, blood pressure 133/88, and saturating 98% on room air.  GENERAL: Moderately built male patient lying in bed, overall seems comfortable, conversational.   PSYCHIATRIC: Alert and oriented x3. Mood and affect appropriate. Judgment intact.  HEENT: Atraumatic, normocephalic. Oral mucosa is pink. External ears and nose normal. No pallor or icterus. Pupils are reactive to light.  NECK: Supple. No thyromegaly. No palpable lymph nodes. Trachea midline. No bruits or JVD.  CARDIOVASCULAR: S1, S2 with a systolic murmur. Peripheral pulses 2+. No edema.  RESPIRATORY: Normal work of breathing. Clear to auscultation on both sides.  GASTROINTESTINAL: Soft abdomen. Tenderness in the right upper quadrant area and Murphy sign positive. No rigidity or guarding found. No palpable masses.  GENITOURINARY: No CVA tenderness or bladder distention.  SKIN:  Warm and dry. No petechiae or rashes.  MUSCULOSKELETAL: No joint swelling, redness, effusion of the large joints. Normal muscle tone.  NEUROLOGICAL: Motor strength 5/5 in bilateral lower extremities. Upper extremities 5/5. Sensation to fine touch intact all over. Babinski is downgoing.   LABORATORY STUDIES: Glucose of 137, BUN 30, creatinine 1.46, sodium 137, potassium 3.7, GFR of 49. AST and ALT are normal. Alkaline phosphatase is mildly elevated at 156, bilirubin 0.8, albumin 2.1. Troponin less than 0.02.   WBC 17.4, hemoglobin 11, platelets of 300,000 with neutrophils of 72%, no bands.   Urinalysis shows no bacteria, 2 WBCs.   EKG shows normal sinus rhythm with occasional PVCs.   Ultrasound of the right upper quadrant area shows gallbladder sludge and gallbladder fundus wall thickening.   ASSESSMENT AND PLAN:  1.  Acute cholecystitis with gallbladder wall thickening, Murphy sign positive, and elevated leukocytosis. I discussed the case with Dr. Michela Pitcher of surgery will be seeing the patient. The patient does have a history of coronary artery disease with coronary artery bypass grafting in the past, but has not had any issues lately. Good functional status although limited by his lower extremity weakness. EKG does not show anything acute. He should be low risk for laparoscopic cholecystectomy. The patient will need IV antibiotics.  2.  Coronary artery disease, stable.  3.  Hypertension. Continue his home medications.  4.  Diabetes mellitus. Start the patient on sliding scale insulin.  5.  Deep vein thrombosis prophylaxis per Dr. Michela Pitcher as per the surgery plans.  6.  Code status: DNR/DNI as discussed with the patient with the family at bedside.   TIME SPENT TODAY ON THIS CONSULT:  45 minutes.   ___________________________ Molinda Bailiff Johnelle Tafolla, MD srs:mc D: 06/17/2014 13:17:53 ET T: 06/17/2014 13:35:05 ET JOB#: [id number]  EAVWUJ R Meadow Abramo MD ELECTRONICALLY SIGNED 06/17/2014 16:29

## 2014-08-29 NOTE — Discharge Summary (Signed)
PATIENT NAME:  Mark Dougherty, Mark Dougherty MR#:  [id number] DATE OF BIRTH:  December 14, 1926  DATE OF ADMISSION:  06/11/2014 DATE OF DISCHARGE:  06/13/2014  PRESENTING COMPLAINT:  Fall and weakness.   DISCHARGE DIAGNOSES: 1.  Acute renal failure due to dehydration, improved.  2.  Mechanical fall without trauma.  3.  Hypertension.  4.  Gait.   CODE STATUS:  NO CODE/DO NOT RESUSCITATE.   MEDICATIONS: 1.  Aspirin 81 mg daily.  2.  Folic acid 1 mg daily.  3.  Zocor 20 mg at bedtime.  4.  Vitamin B12, 250 mcg p.o. daily.  5.  Coreg 6.25 b.i.d.  6.  Glucotrol XL 5 mg b.i.d.  7.  Terazosin 1 mg 2 capsules at bedtime.  8.  Acetaminophen/hydrocodone 325/5 one tablet every 4 hours as needed.  9.  Lidocaine topical patch daily.  10.  Senna 1 tablet b.i.d. as needed.   HOME HEALTH:  Physical therapy.   FOLLOWUP:  With Dr. Arlana Pouch in 1 to 2 weeks.   LABORATORY DATA:  Creatinine at discharge was 1.30, potassium 4, sodium 139, glucose 89. Hemoglobin and hematocrit were 11 and 34.1. Magnesium is 2.0. Creatinine on admission was 1.37. UA is negative for UTI.   CT of the head was negative for any trauma. No acute intracranial abnormality. Left sphenoid sinus disease.   HOSPITAL COURSE:  Mark Dougherty is an 81 year old Caucasian gentleman with multiple medical problems, who came in with:   1.  Acute renal failure with dehydration. Received IV fluids. Appears well hydrated. Creatinine back to baseline.  2.  Generalized weakness with mechanical falls without trauma. PT evaluation noted. Home health PT was recommended, which was set up at discharge.  3.  Hypertension. Resumed home medications.  4.  Type 2 diabetes. His home medications were resumed at discharge.  5.  Coronary artery disease remains stable.  6.  Right rib pain, likely muscular strain. Lidocaine patch was prescribed.   Hospital stay otherwise remained stable.   CODE STATUS:  The patient remained a NO CODE/DO NOT RESUSCITATE.   TIME SPENT:  40  minutes.   ____________________________ Wylie Hail Allena Katz, MD sap:nb D: 06/18/2014 00:58:29 ET T: 06/18/2014 01:45:40 ET JOB#: [id number]  cc: Jury Caserta A. Allena Katz, MD, <Dictator> Jillene Bucks. Arlana Pouch, MD Willow Ora MD ELECTRONICALLY SIGNED 06/29/2014 17:31

## 2014-09-15 ENCOUNTER — Telehealth: Payer: Self-pay | Admitting: *Deleted

## 2014-09-15 NOTE — Telephone Encounter (Signed)
Lm for him to schedule appointment for diabetic shoe measurement

## 2014-09-16 ENCOUNTER — Ambulatory Visit: Payer: Medicare Other | Admitting: Podiatry

## 2014-09-20 ENCOUNTER — Ambulatory Visit: Payer: Medicare Other

## 2014-10-21 ENCOUNTER — Ambulatory Visit (INDEPENDENT_AMBULATORY_CARE_PROVIDER_SITE_OTHER): Payer: Medicare Other | Admitting: Podiatry

## 2014-10-21 DIAGNOSIS — E118 Type 2 diabetes mellitus with unspecified complications: Secondary | ICD-10-CM

## 2014-10-21 NOTE — Progress Notes (Signed)
Pt presents for diabetic shoe pick up , shoes are way too tight and toe is touching the top of the shoe, patient un happy with the shoes and is not crazy about the styles we have. At this time he does not want the shoes. No new complaints at today's visit and no other concerns.

## 2014-10-29 ENCOUNTER — Ambulatory Visit: Payer: Medicare Other | Admitting: Cardiovascular Disease

## 2014-11-03 ENCOUNTER — Encounter: Payer: Self-pay | Admitting: Cardiovascular Disease

## 2014-11-03 ENCOUNTER — Ambulatory Visit (INDEPENDENT_AMBULATORY_CARE_PROVIDER_SITE_OTHER): Payer: Medicare Other | Admitting: Cardiovascular Disease

## 2014-11-03 VITALS — BP 100/62 | HR 92 | Ht 71.5 in | Wt 210.5 lb

## 2014-11-03 DIAGNOSIS — I2510 Atherosclerotic heart disease of native coronary artery without angina pectoris: Secondary | ICD-10-CM | POA: Diagnosis not present

## 2014-11-03 DIAGNOSIS — I6523 Occlusion and stenosis of bilateral carotid arteries: Secondary | ICD-10-CM

## 2014-11-03 DIAGNOSIS — I420 Dilated cardiomyopathy: Secondary | ICD-10-CM

## 2014-11-03 DIAGNOSIS — E860 Dehydration: Secondary | ICD-10-CM

## 2014-11-03 DIAGNOSIS — E118 Type 2 diabetes mellitus with unspecified complications: Secondary | ICD-10-CM

## 2014-11-03 DIAGNOSIS — I4891 Unspecified atrial fibrillation: Secondary | ICD-10-CM | POA: Diagnosis not present

## 2014-11-03 DIAGNOSIS — E785 Hyperlipidemia, unspecified: Secondary | ICD-10-CM

## 2014-11-03 NOTE — Patient Instructions (Signed)
You are doing well. No medication changes were made.  Try Miralex and MOM for constipation, alternate every other day  Please call us if you have new issues that need to be addressed before your next appt.  Your physician wants you to follow-up in: 6 months.  You will receive a reminder letter in the mail two months in advance. If you don't receive a letter, please call our office to schedule the follow-up appointment.

## 2014-11-03 NOTE — Assessment & Plan Note (Signed)
Maintaining normal sinus rhythm. No changes to his medications. 

## 2014-11-03 NOTE — Assessment & Plan Note (Signed)
Well-controlled by his numbers provided today

## 2014-11-03 NOTE — Assessment & Plan Note (Signed)
Appears euvolemic on today's visit. Not on diuretics

## 2014-11-03 NOTE — Assessment & Plan Note (Signed)
Encouraged him to stay on his simvastatin. Goal LDL less than 70 No recent lipid panel available

## 2014-11-03 NOTE — Assessment & Plan Note (Signed)
Currently with no symptoms of angina. No further workup at this time. Continue current medication regimen. 

## 2014-11-03 NOTE — Assessment & Plan Note (Signed)
Recent records from hospitalization February 2016 reviewed. Mild dehydration at that time, failure to thrive. Now doing better, back to his baseline

## 2014-11-03 NOTE — Progress Notes (Signed)
Patient ID: Mark Dougherty, male    DOB: 1926/06/06, 81 y.o.   MRN: [id number]  HPI Comments: 81 year old with history of coronary artery disease, bypass surgery in 1996 in [place], peripheral vascular disease with CEA on the left in 2000 on the left, long history of diabetes, severe chronic back pain with MRI showing spinal stenosis, hyperlipidemia, with recent back surgery 4 to 5 years ago with complications including atrial fibrillation in the perioperative, postoperative period started on amiodarone with conversion to sinus rhythm, with a repeat admission to [facility] for dehydration, hypotension and malaise. He presents for routine followup of his coronary artery disease. Recent hospital admission February 2016 for failure to thrive, dehydration.  He required several weeks in rehabilitation, now feeling well with good strength. He reports he is having problems with constipation on a daily basis Wife reports that he is drinking well. Doing regular exercise on his bike daily Denies any significant chest pain or shortness of breath with exertion. No significant lower extremity swelling. He does have arthritis in his knees Chronic back pain, better when he sits down He walks with a cane and a walker.  diabetes is well controlled. Total cholesterol August 2015 was 130 He denies any lightheadedness or dizziness.  Denies any tachycardia concerning for atrial fibrillation.  EKG on today's visit shows normal sinus rhythm with rate 92 bpm, interventricular conduction delay, unchanged from prior EKGs Rare PVC  Other past medical history  hospitalization in June 19th 2015 for weakness, cough, bronchitis, fall with left radial fracture. Noted to have low TSH, elevated free T4. Amiodarone was held. Also with mild renal insufficiency, elevated sugars. He was treated with antibiotics with improvement of his symptoms.  Previous episodes of COPD exacerbation in the hospital, he did improve with  antibiotics and steroid taper  he uses a cane. History of falls in the past .  Total cholesterol 153 last year.   Notes indicate an ejection fraction of 40% in March of 2011 by echocardiogram.  MRI of his back shows severe spinal stenosis at L2 and L3, other foraminal stenoses of the lumbar region.  No Known Allergies  Outpatient Encounter Prescriptions as of 11/03/2014  Medication Sig  . aspirin 81 MG EC tablet Take 81 mg by mouth daily.    . carvedilol (COREG) 3.125 MG tablet TAKE ONE TABLET BY MOUTH TWICE DAILY  . finasteride (PROSCAR) 5 MG tablet Take 5 mg by mouth daily.  . folic acid (FOLVITE) 1 MG tablet Take 1 mg by mouth daily.    . metFORMIN (GLUCOPHAGE) 500 MG tablet Take 500 mg by mouth 2 (two) times daily with a meal.    . simvastatin (ZOCOR) 20 MG tablet Take 1 tablet (20 mg total) by mouth at bedtime.  Marland Kitchen terazosin (HYTRIN) 1 MG capsule Take 1 mg by mouth at bedtime.    . vitamin B-12 (CYANOCOBALAMIN) 250 MCG tablet Take 250 mcg by mouth daily.     No facility-administered encounter medications on file as of 11/03/2014.    Past Medical History  Diagnosis Date  . Diabetes mellitus   . Hyperlipidemia   . History of BPH   . ASHD (arteriosclerotic heart disease)     previous bypass  . Peripheral neuropathy   . Spinal stenosis   . Hypotension     mild  . S/P CABG x 3   . Left wrist fracture   . Enlarged prostate     Past Surgical History  Procedure Laterality Date  .  Replacement total knee      left  . Coronary artery bypass graft  1996    x3  . Carotid endarterectomy      2010  . Back surgery  1975  . Eye surgery  2013  . Tooth extraction      Social History  reports that he has never smoked. He does not have any smokeless tobacco history on file. He reports that he does not drink alcohol or use illicit drugs.  Family History Family history is unknown by patient.        Review of Systems  Constitutional: Negative.   Respiratory: Negative.    Cardiovascular: Negative.   Gastrointestinal: Negative.   Musculoskeletal: Positive for back pain and gait problem.  Neurological: Negative.   Hematological: Negative.   Psychiatric/Behavioral: Negative.   All other systems reviewed and are negative.   BP 100/62 mmHg  Pulse 92  Ht 5' 11.5" (1.816 m)  Wt 210 lb 8 oz (95.482 kg)  BMI 28.95 kg/m2  Physical Exam  Constitutional: He is oriented to person, place, and time. He appears well-developed and well-nourished.  HENT:  Head: Normocephalic.  Nose: Nose normal.  Mouth/Throat: Oropharynx is clear and moist.  Eyes: Conjunctivae are normal. Pupils are equal, round, and reactive to light.  Neck: Normal range of motion. Neck supple. No JVD present.  Cardiovascular: Normal rate, regular rhythm, S1 normal, S2 normal and intact distal pulses.  Exam reveals no gallop and no friction rub.   Murmur heard.  Crescendo systolic murmur is present with a grade of 2/6  Pulmonary/Chest: Effort normal and breath sounds normal. No respiratory distress. He has no wheezes. He has no rales. He exhibits no tenderness.  Abdominal: Soft. Bowel sounds are normal. He exhibits no distension. There is no tenderness.  Musculoskeletal: Normal range of motion. He exhibits no edema or tenderness.  Lymphadenopathy:    He has no cervical adenopathy.  Neurological: He is alert and oriented to person, place, and time. Coordination normal.  Skin: Skin is warm and dry. No rash noted. No erythema.  Psychiatric: He has a normal mood and affect. His behavior is normal. Judgment and thought content normal.      Assessment and Plan   Nursing note and vitals reviewed.

## 2014-11-23 ENCOUNTER — Ambulatory Visit (INDEPENDENT_AMBULATORY_CARE_PROVIDER_SITE_OTHER): Payer: Medicare Other | Admitting: Podiatry

## 2014-11-23 DIAGNOSIS — M2042 Other hammer toe(s) (acquired), left foot: Secondary | ICD-10-CM

## 2014-11-23 DIAGNOSIS — M2041 Other hammer toe(s) (acquired), right foot: Secondary | ICD-10-CM | POA: Diagnosis not present

## 2014-11-23 DIAGNOSIS — M2012 Hallux valgus (acquired), left foot: Secondary | ICD-10-CM | POA: Diagnosis not present

## 2014-11-23 DIAGNOSIS — S92301A Fracture of unspecified metatarsal bone(s), right foot, initial encounter for closed fracture: Secondary | ICD-10-CM

## 2014-11-23 DIAGNOSIS — E118 Type 2 diabetes mellitus with unspecified complications: Secondary | ICD-10-CM | POA: Diagnosis not present

## 2014-11-23 DIAGNOSIS — M2011 Hallux valgus (acquired), right foot: Secondary | ICD-10-CM

## 2014-11-23 NOTE — Progress Notes (Signed)
Pt presents for diabetic shoe pick up, last pair he was very unhappy with. Not happy with the shoes at all and will just take the inserts for his shoes. Break in instructions were discussed with the patient. He denies any acute changes last appointment and no other complaints at this time. Follow up for regular routine care or sooner if any palms are to arise. Call if questions concerns in the meantime.  Ovid Curd, DPM

## 2014-11-24 ENCOUNTER — Other Ambulatory Visit: Payer: Self-pay

## 2014-11-24 MED ORDER — SIMVASTATIN 20 MG PO TABS: 20.0000 mg | ORAL_TABLET | Freq: Every day | ORAL | Status: DC

## 2014-11-24 NOTE — Telephone Encounter (Signed)
Refill sent for simvastatin.  

## 2015-01-17 ENCOUNTER — Ambulatory Visit (INDEPENDENT_AMBULATORY_CARE_PROVIDER_SITE_OTHER): Payer: Medicare Other | Admitting: Podiatry

## 2015-01-17 DIAGNOSIS — B351 Tinea unguium: Secondary | ICD-10-CM | POA: Diagnosis not present

## 2015-01-17 DIAGNOSIS — M79676 Pain in unspecified toe(s): Secondary | ICD-10-CM

## 2015-01-17 NOTE — Progress Notes (Signed)
Patient ID: BUTCH CRADER, male   DOB: [birth year], 81 y.o.   MRN: [id number]  Subjective: Mr. Lara  presents the office today with painful, elongated toenails which he is unable to trim himself. Denies any redness or drainage around the nail sites. Since last appointment he states the ecchymosis over the right foot healed shortly after and he no longer had any problem with his right foot. No other complaints at this time and no acute changes since last appointment.   Objective: AAO 3, NAD DP/PT pulses decreased bilaterally, CRT less than 3 seconds Protective sensation decreased with Simms Weinstein monofilament, decreased vibratory sensation. Nails hypertrophic, dystrophic, elongated, brittle, discolored 10. There subjective tenderness over nails 1-5 bilaterally. There is no surrounding erythema or drainage from the nail sites. No other areas of tenderness to bilateral lower shoes. No overlying edema, erythema, increased warmth. No open lesions or pre-ulcer lesions identified bilaterally. No interdigital maceration. No pain with calf compression, swelling, warmth, erythema.  Assessment: Patient presents with symptomatic onychomycosis  Plan: -Treatment options discussed included alternatives, risks, complications. -Nail sharply debrided 10 without complication/bleeding. -Discussed the importance of daily foot inspection. -. Otherwise we can re-evaluated next appointment. -Follow-up in 3 months or sooner if any problems are to arise. In the meantime occurs to call the office with any questions, concerns, changes symptoms. Using old Diabetic shoes with new inserts, which were dispensed last visit (new DM shoes did not work well). 3 mo.  Arbutus Ped DPM

## 2015-01-21 ENCOUNTER — Telehealth: Payer: Self-pay | Admitting: *Deleted

## 2015-01-21 NOTE — Telephone Encounter (Signed)
Lmom to call our office. Time to schedule a carotid u/s (1yr f/u). 

## 2015-01-26 ENCOUNTER — Other Ambulatory Visit: Payer: Self-pay | Admitting: Cardiovascular Disease

## 2015-01-26 DIAGNOSIS — I6523 Occlusion and stenosis of bilateral carotid arteries: Secondary | ICD-10-CM

## 2015-02-03 ENCOUNTER — Ambulatory Visit (INDEPENDENT_AMBULATORY_CARE_PROVIDER_SITE_OTHER): Payer: Medicare Other

## 2015-02-03 DIAGNOSIS — I6523 Occlusion and stenosis of bilateral carotid arteries: Secondary | ICD-10-CM | POA: Diagnosis not present

## 2015-02-08 ENCOUNTER — Ambulatory Visit: Payer: Medicare Other | Attending: Orthopaedic Surgery

## 2015-02-08 DIAGNOSIS — M25511 Pain in right shoulder: Secondary | ICD-10-CM | POA: Insufficient documentation

## 2015-02-08 DIAGNOSIS — M25611 Stiffness of right shoulder, not elsewhere classified: Secondary | ICD-10-CM

## 2015-02-08 DIAGNOSIS — M542 Cervicalgia: Secondary | ICD-10-CM | POA: Diagnosis present
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
## 2015-02-10 ENCOUNTER — Ambulatory Visit: Payer: Medicare Other

## 2015-02-10 DIAGNOSIS — M25611 Stiffness of right shoulder, not elsewhere classified: Secondary | ICD-10-CM

## 2015-02-10 DIAGNOSIS — M542 Cervicalgia: Secondary | ICD-10-CM

## 2015-02-10 NOTE — Patient Instructions (Signed)
[email or web address] Right upper trap stretch 3x20 sec Right levator scapulae stretch 3x20 sec

## 2015-02-10 NOTE — Therapy (Signed)
[facility] MAIN [facility] SERVICES [address] [place], [place], [address] Phone: [phone]   Fax:  [phone]  Physical Therapy Treatment  Patient Details  Name: Mark Dougherty MRN: [id number] Date of Birth: [birth year] Referring Provider:  Rudy Jew, MD  Encounter Date: 02/10/2015      PT End of Session - 02/10/15 1412    Visit Number 2   Number of Visits 9   Date for PT Re-Evaluation 03/08/15   Authorization Type 2/10 g codes   PT Start Time 1300   PT Stop Time 1345   PT Time Calculation (min) 45 min   Activity Tolerance Patient limited by pain   Behavior During Therapy [facility] for tasks assessed/performed      Past Medical History  Diagnosis Date  . Diabetes mellitus   . Hyperlipidemia   . History of BPH   . ASHD (arteriosclerotic heart disease)     previous bypass  . Peripheral neuropathy (HCC)   . Spinal stenosis   . Hypotension     mild  . S/P CABG x 3   . Left wrist fracture   . Enlarged prostate     Past Surgical History  Procedure Laterality Date  . Replacement total knee      left  . Coronary artery bypass graft  1996    x3  . Carotid endarterectomy      2010  . Back surgery  1975  . Eye surgery  2013  . Tooth extraction      There were no vitals filed for this visit.  Visit Diagnosis:  Cervicalgia  Decreased ROM of right shoulder      Subjective Assessment - 02/10/15 1409    Subjective Pt reports he did not have any sharp pain yesterday but today his upper right trap is "irritated."   Patient Stated Goals to decrease left upper neck pain    Currently in Pain? Yes   Pain Score 6    Pain Location Shoulder   Pain Orientation Right   Pain Descriptors / Indicators Aching         manual therapy: Moist heat x5 min to promote muscle extensibility: no charge Extensive soft tissue mobilization(effluerage and ischemic compression) to right upper trap Pt reported minor increase in pain during  soft tissue mobilization and decreased pain after.  Followed by there ex: Scapular retractions x10 x 5, was discontinued on the second set due to increase in headache Right upper trap stretch 2x3, 20 sec holds, pt instructed to perform stretch without feeling pain.   Right levator scapulae stretch 2x3, 20 sec hold, pt instructed to perform stretch without pain Pt reported decreased pain and restriction at the end of the session Pt required verbal and visual cues for correct exercise technique                         PT Education - 02/10/15 1411    Education provided Yes   Education Details plan of care, new exercises for HEP   Person(s) Educated Patient   Methods Explanation   Comprehension Verbalized understanding             PT Long Term Goals - 02/09/15 1238    PT LONG TERM GOAL #1   Title pt report less than 4/10 pain when performing toliting activities    Baseline pt reports he is unable to perform toliting with right UE   Period Weeks  Status New   PT LONG TERM GOAL #2   Title pt's quick dash will improve by 10 points indicating improved UE functional use   Baseline 68.18%   Period Weeks   PT LONG TERM GOAL #3   Title pt report less than 5/10 neck pain after performing cross word puzzles   Baseline pt reports 8/10 pain after completing cross word puzzle    Time 4   Period Weeks   Status New               Plan - 02/10/15 1540    Clinical Impression Statement pt experienced slight  increase in pain (1 point) with extensive soft tissue mobilization to his right upper trap today.  pt reported feeling better at the end of the session especially after right upper trap and levator scapulae stretches.  Pt sits with rounded and forward head and is conscious of his posture and is working on it.     Pt will benefit from skilled therapeutic intervention in order to improve on the following deficits Decreased range of motion;Impaired UE functional  use;Pain;Postural dysfunction;Decreased strength   Rehab Potential Fair   PT Frequency 2x / week   PT Duration 4 weeks   PT Treatment/Interventions Moist Heat;Iontophoresis /ml Dexamethasone;Electrical Stimulation;Cryotherapy;Biofeedback;Aquatic Therapy;Ultrasound;Therapeutic exercise;Therapeutic activities;Manual techniques;Neuromuscular re-education;Passive range of motion   PT Next Visit Plan progress HEP        Problem List Patient Active Problem List   Diagnosis Date Noted  . Atrial fibrillation (HCC) 01/04/2011  . Congestive dilated cardiomyopathy (HCC) 09/28/2010  . Dehydration 09/14/2010  . Diabetes mellitus type 2, controlled, with complications (HCC) 01/09/2010  . Hyperlipidemia 01/09/2010  . CAD (coronary artery disease) 01/09/2010  . Carotid stenosis 01/09/2010   Janus Molder, SPT This entire session was performed under direct supervision and direction of a licensed therapist/therapist assistant . I have personally read, edited and approve of the note as written. Carlyon Shadow. Tortorici, PT, DPT [phone]  Tortorici,Ashley 02/11/2015, 3:43 PM  [facility] MAIN [facility] SERVICES [address] [place], [place], [address] Phone: [phone]   Fax:  [phone]

## 2015-02-15 ENCOUNTER — Ambulatory Visit: Payer: Medicare Other

## 2015-02-15 DIAGNOSIS — M542 Cervicalgia: Secondary | ICD-10-CM | POA: Diagnosis not present

## 2015-02-15 DIAGNOSIS — M25611 Stiffness of right shoulder, not elsewhere classified: Secondary | ICD-10-CM

## 2015-02-15 NOTE — Patient Instructions (Signed)
\   Strengthening: Rotation - Resisted    Facing forward with fingertips on right temple, turn head to that side. Give light resistance. Repeat _10_ times per set. Do __2__ sets per session. Do __4_ sessions per week.  http://orth.exer.us/324   Copyright  VHI. All rights reserved.  Strengthening: Flexion - Resisted    Facing forward, fingertips on forehead, bend head forward. Give light resistance. Repeat __10__ times per set. Do __2__ sets per session. Do _4___ sessions per week.  http://orth.exer.us/328   Copyright  VHI. All rights reserved.  Strengthening: Extension - Resisted    Facing forward, fingertips on back of head, bend head backward. Give light resistance. Repeat ___10_ times per set. Do _2___ sets per session. Do ___4_ sessions per week.  http://orth.exer.us/330   Copyright  VHI. All rights reserved.

## 2015-02-16 NOTE — Therapy (Addendum)
[place] MAIN [facility] SERVICES [address] [place], [place], [address] Phone: [phone]   Fax:  [phone]  Physical Therapy Treatment/DC note 10/12-10/18/2016  Patient Details  Name: Mark Dougherty MRN: [id number] Date of Birth: [birth year] No Data Recorded  Encounter Date: 02/15/2015      PT End of Session - 02/16/15 0825    Visit Number 3   Number of Visits 9   Date for PT Re-Evaluation 03/08/15   Authorization Type 3/10 g codes   PT Start Time 1300   PT Stop Time 1345   PT Time Calculation (min) 45 min   Activity Tolerance Patient tolerated treatment well   Behavior During Therapy [facility] for tasks assessed/performed      Past Medical History  Diagnosis Date  . Diabetes mellitus   . Hyperlipidemia   . History of BPH   . ASHD (arteriosclerotic heart disease)     previous bypass  . Peripheral neuropathy ([place])   . Spinal stenosis   . Hypotension     mild  . S/P CABG x 3   . Left wrist fracture   . Enlarged prostate     Past Surgical History  Procedure Laterality Date  . Replacement total knee      left  . Coronary artery bypass graft  1996    x3  . Carotid endarterectomy      2010  . Back surgery  1975  . Eye surgery  2013  . Tooth extraction      There were no vitals filed for this visit.  Visit Diagnosis:  Cervicalgia  Decreased ROM of right shoulder      Subjective Assessment - 02/16/15 0824    Subjective Pt reports he is doing really well and now has no pain in his right upper trap and is able to perform toileting activities without pain.  Pt is back to PLOF after performing HEP over the weekend.     Patient Stated Goals to decrease left upper neck pain    Currently in Pain? No/denies   Pain Score 0-No pain      There ex: Reassessed ROM and outcome measure to assess progress towards goals  Cervical AROM with no pain Flexion: 55 Extension:  44 R rotation:  70 L rotation: 70  Right shoulder  AROM Flexion: 150 with no pain  Abduction: 160 with no pain Quick Dash: 20.45 %  Static resisted cervical strengthening in sitting: Flexion 2x10 with 3 sec holds Extension 2x10 with 3sec holds Right lateral rotation with 3 sec holds Left lateral rotation with 3 sec holds  Pt required verbal and visual cues on correct exercise technique, especially with cervical extension due to patient arching his back  Scapular retractions 2x10, pt required verbal, visual and tactile cues to perform exercise with correct form and from shrugging his shoulders.                            PT Education - 02/16/15 0825    Education provided Yes   Education Details plan of care and new exercises for HEP   Person(s) Educated Patient   Methods Explanation   Comprehension Verbalized understanding             PT Long Term Goals - 02/16/15 0826    PT LONG TERM GOAL #1   Title pt report less than 4/10 pain when performing toliting activities    Baseline pt  reports he is unable to perform toliting with right UE on eval; pt reports no pain with toileting activities on 10/18   Time 4   Period Weeks   Status Achieved   PT LONG TERM GOAL #2   Title pt's quick dash will improve by 10 points indicating improved UE functional use   Baseline 68.18% on eval; 20.45% on 10/18   Time 4   Period Weeks   Status Achieved   PT LONG TERM GOAL #3   Title pt report less than 5/10 neck pain after performing cross word puzzles   Baseline pt reports 8/10 pain after completing cross word puzzle on eval; no pain with cross word puzzles on 10/18   Time 4   Period Weeks   Status Achieved               Plan - Feb 18, 2015 9458    Clinical Impression Statement pt has progressed well with PT and has met all his goals for PT and reports he is back to PLOF.  Pt is now able to perform all his pervious functional activities without pain and has no cervical or right shoulder pain.  Pt will be discharged  at this time with an HEP to maintain and increase gains made in PT   Pt will benefit from skilled therapeutic intervention in order to improve on the following deficits Decreased range of motion;Impaired UE functional use;Pain;Postural dysfunction;Decreased strength   Rehab Potential Fair   PT Frequency 2x / week   PT Duration 4 weeks   PT Treatment/Interventions Moist Heat;Iontophoresis 7m/ml Dexamethasone;Electrical Stimulation;Cryotherapy;Biofeedback;Aquatic Therapy;Ultrasound;Therapeutic exercise;Therapeutic activities;Manual techniques;Neuromuscular re-education;Passive range of motion          G-Codes - 1October 21, 20160828    Functional Assessment Tool Used clinical judgement, history, cervical and shoulder ROM, quick dash   Functional Limitation Changing and maintaining body position   Changing and Maintaining Body Position Current Status ((P9292 At least 20 percent but less than 40 percent impaired, limited or restricted   Changing and Maintaining Body Position Goal Status ((K4628 At least 20 percent but less than 40 percent impaired, limited or restricted   Changing and Maintaining Body Position Discharge Status (([id number] At least 20 percent but less than 40 percent impaired, limited or restricted      Problem List Patient Active Problem List   Diagnosis Date Noted  . Atrial fibrillation ([place] 01/04/2011  . Congestive dilated cardiomyopathy ([place] 09/28/2010  . Dehydration 09/14/2010  . Diabetes mellitus type 2, controlled, with complications ([place] 071/16/5790 . Hyperlipidemia 01/09/2010  . CAD (coronary artery disease) 01/09/2010  . Carotid stenosis 01/09/2010   JRenford Dills SPT This entire session was performed under direct supervision and direction of a licensed therapist/therapist assistant . I have personally read, edited and approve of the note as written. AGorden Harms Tortorici, PT, DPT #[id number] Tortorici,Ashley 1Oct 21, 2016 4:19 PM  [place] [facility] [address] [address]Phone: [phone]  Fax:  [phone] Name: WNEMESIO CASTRILLONMRN: [id number] of Birth: [birth year]

## 2015-04-18 ENCOUNTER — Encounter: Payer: Self-pay | Admitting: Podiatry

## 2015-04-18 ENCOUNTER — Ambulatory Visit: Payer: Medicare Other

## 2015-04-18 ENCOUNTER — Ambulatory Visit (INDEPENDENT_AMBULATORY_CARE_PROVIDER_SITE_OTHER): Payer: Medicare Other | Admitting: Podiatry

## 2015-04-18 DIAGNOSIS — B351 Tinea unguium: Secondary | ICD-10-CM

## 2015-04-18 DIAGNOSIS — M79676 Pain in unspecified toe(s): Secondary | ICD-10-CM

## 2015-04-18 DIAGNOSIS — E118 Type 2 diabetes mellitus with unspecified complications: Secondary | ICD-10-CM

## 2015-04-18 NOTE — Progress Notes (Signed)
He presents today with a chief complaint of painful elongated toenails.  Objective: Vital signs are stable he is alert and oriented 3. Pulses are strongly palpable. Neurologic sensorium is intact. The tendon reflexes are intact. Muscle strength +5 over 5. Toenails are thick yellow dystrophic onychomycotic painful palpation.  Assessment pain in limb second onychomycosis 1 through 5.  Plan: Debridement of toenails 1 through 5 bilateral. Follow up with him in 3 months.

## 2015-05-20 ENCOUNTER — Ambulatory Visit (INDEPENDENT_AMBULATORY_CARE_PROVIDER_SITE_OTHER): Payer: Medicare Other | Admitting: Cardiovascular Disease

## 2015-05-20 ENCOUNTER — Encounter: Payer: Self-pay | Admitting: Cardiovascular Disease

## 2015-05-20 VITALS — BP 120/70 | HR 89 | Ht 71.5 in | Wt 215.0 lb

## 2015-05-20 DIAGNOSIS — I6523 Occlusion and stenosis of bilateral carotid arteries: Secondary | ICD-10-CM

## 2015-05-20 DIAGNOSIS — I2510 Atherosclerotic heart disease of native coronary artery without angina pectoris: Secondary | ICD-10-CM | POA: Diagnosis not present

## 2015-05-20 DIAGNOSIS — I420 Dilated cardiomyopathy: Secondary | ICD-10-CM

## 2015-05-20 DIAGNOSIS — E118 Type 2 diabetes mellitus with unspecified complications: Secondary | ICD-10-CM

## 2015-05-20 DIAGNOSIS — R0602 Shortness of breath: Secondary | ICD-10-CM | POA: Diagnosis not present

## 2015-05-20 DIAGNOSIS — E785 Hyperlipidemia, unspecified: Secondary | ICD-10-CM

## 2015-05-20 DIAGNOSIS — I4891 Unspecified atrial fibrillation: Secondary | ICD-10-CM | POA: Diagnosis not present

## 2015-05-20 NOTE — Assessment & Plan Note (Signed)
Higher hemoglobin A1c through the holiday season Manage by primary care

## 2015-05-20 NOTE — Assessment & Plan Note (Signed)
Currently with no symptoms of angina. No further workup at this time. Continue current medication regimen. 

## 2015-05-20 NOTE — Assessment & Plan Note (Signed)
Cholesterol is at goal on the current lipid regimen. No changes to the medications were made.  

## 2015-05-20 NOTE — Assessment & Plan Note (Signed)
EKG concerning for underlying flutter rhythm. We will discuss with Dr. Graciela Husbands, EP If he is in fact having arrhythmia, may need anticoagulation

## 2015-05-20 NOTE — Assessment & Plan Note (Signed)
History of CEA on the left.  40 to 59% b/l carotid disease in 2015

## 2015-05-20 NOTE — Progress Notes (Signed)
Patient ID: Mark Dougherty, male    DOB: 08-03-1926, 82 y.o.   MRN: [id number]  HPI Comments: 82 year old with history of coronary artery disease, bypass surgery in 1996 in [place], peripheral vascular disease with CEA on the left in 2000 on the left, long history of diabetes, severe chronic back pain with MRI showing spinal stenosis, hyperlipidemia, with recent back surgery 80 to 80 years ago with complications including atrial fibrillation in the perioperative, postoperative period started on amiodarone with conversion to sinus rhythm, with a repeat admission to [facility] for dehydration, hypotension and malaise. He presents for routine followup of his coronary artery disease.  hospital admission February 80 for failure to thrive, dehydration. of his coronary artery disease.  hospital admission February 2016 for failure to thrive, dehydration.  In follow-up today, he reports that he is doing relatively well  severe right knee pain, does not want total knee replacement surgery Denies any significant leg edema, no significant shortness of breath on exertion Reports his sugars have been up and down, Hemoglobin A1c up a little bit but still in the 6 range, managed by Dr. Arlana Pouch Severe low back pain, better when he sits down  Total cholesterol August 2015 was 130  EKG on today's visit shows regular rhythm, rate 84 bpm, interventricular conduction delay, unable to exclude  atrial flutter,  Other past medical history  hospitalization in June 19th 2015 for weakness, cough, bronchitis, fall with left radial fracture. Noted to have low TSH, elevated free T4. Amiodarone was held. Also with mild renal insufficiency, elevated sugars. He was treated with antibiotics with improvement of his symptoms.  Previous episodes of COPD exacerbation in the hospital, he did improve with antibiotics and steroid taper  he uses a cane. History of falls in the past .  Total cholesterol 153 last year.   Notes indicate an ejection fraction of 40% in March of 2011 by echocardiogram.  MRI of his back shows severe spinal  stenosis at L2 and L3, other foraminal stenoses of the lumbar region.  No Known Allergies  Outpatient Encounter Prescriptions as of 05/20/2015  Medication Sig  . aspirin 81 MG EC tablet Take 81 mg by mouth daily.    . carvedilol (COREG) 3.125 MG tablet TAKE ONE TABLET BY MOUTH TWICE DAILY  . finasteride (PROSCAR) 5 MG tablet Take 5 mg by mouth daily.  . folic acid (FOLVITE) 1 MG tablet Take 1 mg by mouth daily.    . metFORMIN (GLUCOPHAGE) 500 MG tablet Take 500 mg by mouth 2 (two) times daily with a meal.    . simvastatin (ZOCOR) 20 MG tablet Take 1 tablet (20 mg total) by mouth at bedtime.  Marland Kitchen terazosin (HYTRIN) 1 MG capsule Take 1 mg by mouth at bedtime.    . vitamin B-12 (CYANOCOBALAMIN) 250 MCG tablet Take 250 mcg by mouth daily.     No facility-administered encounter medications on file as of 05/20/2015.    Past Medical History  Diagnosis Date  . Diabetes mellitus   . Hyperlipidemia   . History of BPH   . ASHD (arteriosclerotic heart disease)     previous bypass  . Peripheral neuropathy (HCC)   . Spinal stenosis   . Hypotension     mild  . S/P CABG x 3   . Left wrist fracture   . Enlarged prostate     Past Surgical History  Procedure Laterality Date  . Replacement total knee      left  . Coronary artery bypass graft  1996    x3  . Carotid endarterectomy  2010  . Back surgery  1975  . Eye surgery  2013  . Tooth extraction      Social History  reports that he has never smoked. He does not have any smokeless tobacco history on file. He reports that he does not drink alcohol or use illicit drugs.  Family History Family history is unknown by patient.        Review of Systems  Constitutional: Negative.   Respiratory: Negative.   Cardiovascular: Negative.   Gastrointestinal: Negative.   Musculoskeletal: Positive for back pain and gait problem.  Neurological: Negative.   Hematological: Negative.   Psychiatric/Behavioral: Negative.   All other systems  reviewed and are negative.   BP 120/70 mmHg  Pulse 89  Ht 5' 11.5" (1.816 m)  Wt 215 lb (97.523 kg)  BMI 29.57 kg/m2  Physical Exam  Constitutional: He is oriented to person, place, and time. He appears well-developed and well-nourished.  HENT:  Head: Normocephalic.  Nose: Nose normal.  Mouth/Throat: Oropharynx is clear and moist.  Eyes: Conjunctivae are normal. Pupils are equal, round, and reactive to light.  Neck: Normal range of motion. Neck supple. No JVD present.  Cardiovascular: Normal rate, regular rhythm, S1 normal, S2 normal and intact distal pulses.  Exam reveals no gallop and no friction rub.   Murmur heard.  Crescendo systolic murmur is present with a grade of 2/6  Pulmonary/Chest: Effort normal and breath sounds normal. No respiratory distress. He has no wheezes. He has no rales. He exhibits no tenderness.  Abdominal: Soft. Bowel sounds are normal. He exhibits no distension. There is no tenderness.  Musculoskeletal: Normal range of motion. He exhibits no edema or tenderness.  Lymphadenopathy:    He has no cervical adenopathy.  Neurological: He is alert and oriented to person, place, and time. Coordination normal.  Skin: Skin is warm and dry. No rash noted. No erythema.  Psychiatric: He has a normal mood and affect. His behavior is normal. Judgment and thought content normal.      Assessment and Plan   Nursing note and vitals reviewed.

## 2015-05-20 NOTE — Patient Instructions (Signed)
You are doing well. No medication changes were made.  Please call us if you have new issues that need to be addressed before your next appt.  Your physician wants you to follow-up in: 12 months.  You will receive a reminder letter in the mail two months in advance. If you don't receive a letter, please call our office to schedule the follow-up appointment. 

## 2015-05-20 NOTE — Assessment & Plan Note (Signed)
Ejection fraction 40%,  He appears relatively euvolemic on today's visit, We'll continue current medications

## 2015-05-25 ENCOUNTER — Telehealth: Payer: Self-pay

## 2015-05-25 DIAGNOSIS — I4891 Unspecified atrial fibrillation: Secondary | ICD-10-CM

## 2015-05-25 NOTE — Telephone Encounter (Signed)
Spoke w/ pt.  Advised him that Dr. Mariah Milling spoke w/ Dr. Graciela Husbands and they agree that pt needs to be on a blood thinner, but need labs before they can determine which one.  Pt is agreeable to coming over tomorrow for BMET, but asks for later in the am. Pt sched for lab draw tomorrow @ 9:30.

## 2015-05-26 ENCOUNTER — Other Ambulatory Visit (INDEPENDENT_AMBULATORY_CARE_PROVIDER_SITE_OTHER): Payer: Medicare Other | Admitting: *Deleted

## 2015-05-26 DIAGNOSIS — I4891 Unspecified atrial fibrillation: Secondary | ICD-10-CM

## 2015-05-27 LAB — BASIC METABOLIC PANEL
BUN / CREAT RATIO: 19 (ref 10–22)
BUN: 21 mg/dL (ref 8–27)
CHLORIDE: 99 mmol/L (ref 96–106)
CO2: 21 mmol/L (ref 18–29)
Calcium: 9.2 mg/dL (ref 8.6–10.2)
Creatinine, Ser: 1.12 mg/dL (ref 0.76–1.27)
GFR calc non Af Amer: 59 mL/min/{1.73_m2} — ABNORMAL LOW (ref >59)
GFR, EST AFRICAN AMERICAN: 68 mL/min/{1.73_m2} (ref >59)
GLUCOSE: 124 mg/dL — AB (ref 65–99)
Potassium: 4.8 mmol/L (ref 3.5–5.2)
Sodium: 136 mmol/L (ref 134–144)

## 2015-05-31 ENCOUNTER — Encounter: Payer: Self-pay | Admitting: Cardiovascular Disease

## 2015-05-31 ENCOUNTER — Ambulatory Visit (INDEPENDENT_AMBULATORY_CARE_PROVIDER_SITE_OTHER): Payer: Medicare Other | Admitting: Cardiovascular Disease

## 2015-05-31 VITALS — BP 121/76 | HR 93 | Ht 71.0 in | Wt 214.0 lb

## 2015-05-31 DIAGNOSIS — I4891 Unspecified atrial fibrillation: Secondary | ICD-10-CM

## 2015-05-31 DIAGNOSIS — I4892 Unspecified atrial flutter: Secondary | ICD-10-CM | POA: Insufficient documentation

## 2015-05-31 DIAGNOSIS — I2510 Atherosclerotic heart disease of native coronary artery without angina pectoris: Secondary | ICD-10-CM

## 2015-05-31 DIAGNOSIS — I6523 Occlusion and stenosis of bilateral carotid arteries: Secondary | ICD-10-CM

## 2015-05-31 DIAGNOSIS — G629 Polyneuropathy, unspecified: Secondary | ICD-10-CM

## 2015-05-31 DIAGNOSIS — E785 Hyperlipidemia, unspecified: Secondary | ICD-10-CM

## 2015-05-31 DIAGNOSIS — I483 Typical atrial flutter: Secondary | ICD-10-CM | POA: Diagnosis not present

## 2015-05-31 DIAGNOSIS — I420 Dilated cardiomyopathy: Secondary | ICD-10-CM | POA: Diagnosis not present

## 2015-05-31 DIAGNOSIS — E118 Type 2 diabetes mellitus with unspecified complications: Secondary | ICD-10-CM

## 2015-05-31 MED ORDER — APIXABAN 5 MG PO TABS: 5.0000 mg | ORAL_TABLET | Freq: Two times a day (BID) | ORAL | Status: DC

## 2015-05-31 NOTE — Assessment & Plan Note (Signed)
Ejection fraction 40%  Several years ago. appears relatively euvolemic on today's visit,  continue current medications  may need repeat echocardiogram if  He prefers ablation

## 2015-05-31 NOTE — Assessment & Plan Note (Signed)
History of CEA on the left.  40 to 59% b/l carotid disease

## 2015-05-31 NOTE — Assessment & Plan Note (Signed)
Currently with no symptoms of angina. No further workup at this time. Continue current medication regimen. 

## 2015-05-31 NOTE — Assessment & Plan Note (Signed)
Shooting pain down his legs, likely secondary to diabetes and spinal stenosis  Unclear he may benefit from Neurontin or other medication such as Lyrica/Cymbalta  Will defer to primary care

## 2015-05-31 NOTE — Assessment & Plan Note (Signed)
Managed by Dr. Arlana Pouch  He reports having labile sugars at this time, etiology unclear  He is recording his numbers at home for further review

## 2015-05-31 NOTE — Assessment & Plan Note (Signed)
Asymptomatic atrial flutter. He is an elderly gentleman, high risk for anticoagulation.  We have discussed the various treatment options with him. He is interested in possible ablation.  Recommended he start anticoagulation with eliquis  5 mg twice a day  With follow-up with Dr. Graciela Husbands, EP,  In 3-4 weeks time to discuss possible ablation.   Turns out he is only taking carvedilol 3.125 mill grams daily. Recommended he take this twice a day for now

## 2015-05-31 NOTE — Assessment & Plan Note (Addendum)
Encouraged him to stay on his simvastatin.  goal LDL less than 70   Total encounter time more than 25 minutes  Greater than 50% was spent in counseling and coordination of care with the patient

## 2015-05-31 NOTE — Progress Notes (Signed)
Patient ID: Mark Dougherty, male    DOB: 1926-06-20, 82 y.o.   MRN: [id number]  HPI Comments: 82 year old with history of coronary artery disease, bypass surgery in 1996 in [place], peripheral vascular disease with CEA on the left in 2000 on the left, long history of diabetes, severe chronic back pain with MRI showing spinal stenosis, hyperlipidemia, with recent back surgery 4 to 5 years ago with complications including atrial fibrillation in the perioperative, postoperative period started on amiodarone with conversion to sinus rhythm, with a repeat admission to [facility] for dehydration, hypotension and malaise. He presents for routine followup of his coronary artery disease.  hospital admission February 2016 for failure to thrive, dehydration.  recent EKG showing atrial flutter   He is seen back today to discuss options for anticoagulation and strategies for his atrial flutter  He reports he is asymptomatic, would like to be treated aggressively for his flutter as he does not want a stroke.  Previously was on warfarin with no occasions  He is older now, no recent falls but a fall risk  Reports his sugars are labile, managed by Dr. Donnella Bi complaint is shooting nerve pain down into his feet, chronic low back pain worse when standing  Also with severe right knee pain , does not want knee surgery  Total cholesterol August 2015 was 130  EKG on today's visit shows  Atrial flutter with ventricular rate 97 bpm, rare PVC  Other past medical history  hospitalization in June 19th 2015 for weakness, cough, bronchitis, fall with left radial fracture. Noted to have low TSH, elevated free T4. Amiodarone was held. Also with mild renal insufficiency, elevated sugars. He was treated with antibiotics with improvement of his symptoms.  Previous episodes of COPD exacerbation in the hospital, he did improve with antibiotics and steroid taper  he uses a cane. History of falls in the past .  Total  cholesterol 153 last year.   Notes indicate an ejection fraction of 40% in March of 2011 by echocardiogram.  MRI of his back shows severe spinal stenosis at L2 and L3, other foraminal stenoses of the lumbar region.  No Known Allergies  Outpatient Encounter Prescriptions as of 05/31/2015  Medication Sig  . carvedilol (COREG) 3.125 MG tablet TAKE ONE TABLET BY MOUTH TWICE DAILY  . finasteride (PROSCAR) 5 MG tablet Take 5 mg by mouth daily.  . folic acid (FOLVITE) 1 MG tablet Take 1 mg by mouth daily.    . metFORMIN (GLUCOPHAGE) 500 MG tablet Take 500 mg by mouth 2 (two) times daily with a meal.    . simvastatin (ZOCOR) 20 MG tablet Take 1 tablet (20 mg total) by mouth at bedtime.  Marland Kitchen terazosin (HYTRIN) 1 MG capsule Take 1 mg by mouth at bedtime.    . vitamin B-12 (CYANOCOBALAMIN) 250 MCG tablet Take 250 mcg by mouth daily.    . [DISCONTINUED] aspirin 81 MG EC tablet Take 81 mg by mouth daily.    Marland Kitchen apixaban (ELIQUIS) 5 MG TABS tablet Take 1 tablet (5 mg total) by mouth 2 (two) times daily.   No facility-administered encounter medications on file as of 05/31/2015.    Past Medical History  Diagnosis Date  . Diabetes mellitus   . Hyperlipidemia   . History of BPH   . ASHD (arteriosclerotic heart disease)     previous bypass  . Peripheral neuropathy (HCC)   . Spinal stenosis   . Hypotension     mild  .  S/P CABG x 3   . Left wrist fracture   . Enlarged prostate     Past Surgical History  Procedure Laterality Date  . Replacement total knee      left  . Coronary artery bypass graft  1996    x3  . Carotid endarterectomy      2010  . Back surgery  1975  . Eye surgery  2013  . Tooth extraction      Social History  reports that he has never smoked. He does not have any smokeless tobacco history on file. He reports that he does not drink alcohol or use illicit drugs.  Family History Family history is unknown by patient.        Review of Systems  Constitutional: Negative.    Respiratory: Negative.   Cardiovascular: Negative.   Gastrointestinal: Negative.   Musculoskeletal: Positive for back pain and gait problem.  Neurological: Negative.   Hematological: Negative.   Psychiatric/Behavioral: Negative.   All other systems reviewed and are negative.   BP 121/76 mmHg  Pulse 93  Ht  (1.803 m)  Wt 214 lb (97.07 kg)  BMI 29.86 kg/m2  Physical Exam  Constitutional: He is oriented to person, place, and time. He appears well-developed and well-nourished.  HENT:  Head: Normocephalic.  Nose: Nose normal.  Mouth/Throat: Oropharynx is clear and moist.  Eyes: Conjunctivae are normal. Pupils are equal, round, and reactive to light.  Neck: Normal range of motion. Neck supple. No JVD present.  Cardiovascular: Normal rate, regular rhythm, S1 normal, S2 normal and intact distal pulses.   Occasional extrasystoles are present. Exam reveals no gallop and no friction rub.   Murmur heard.  Crescendo systolic murmur is present with a grade of 2/6  Pulmonary/Chest: Effort normal and breath sounds normal. No respiratory distress. He has no wheezes. He has no rales. He exhibits no tenderness.  Abdominal: Soft. Bowel sounds are normal. He exhibits no distension. There is no tenderness.  Musculoskeletal: Normal range of motion. He exhibits no edema or tenderness.  Lymphadenopathy:    He has no cervical adenopathy.  Neurological: He is alert and oriented to person, place, and time. Coordination normal.  Skin: Skin is warm and dry. No rash noted. No erythema.  Psychiatric: He has a normal mood and affect. His behavior is normal. Judgment and thought content normal.      Assessment and Plan   Nursing note and vitals reviewed.

## 2015-05-31 NOTE — Patient Instructions (Signed)
You are doing well.  Please start eliquis 5 mg one pill twice a day Appt with Dr. Graciela Husbands to discuss ablation in 3 to 4 weeks  Please call us if you have new issues that need to be addressed before your next appt.  Your physician wants you to follow-up in: 6 months.  You will receive a reminder letter in the mail two months in advance. If you don't receive a letter, please call our office to schedule the follow-up appointment.

## 2015-06-09 ENCOUNTER — Ambulatory Visit: Payer: Medicare Other | Admitting: Cardiovascular Disease

## 2015-06-20 NOTE — Progress Notes (Signed)
ELECTROPHYSIOLOGY CONSULT NOTE  Patient ID: Mark Dougherty, MRN: [id number], DOB/AGE: 08-Jun-1926 82 y.o. Admit date: (Not on file) Date of Consult: 06/21/2015  Primary Physician: [name], MD Primary Cardiologist: TG Consulting Physician TG  Chief Complaint: Atrial flutter   HPI Mark Dougherty is a 82 y.o. male referred for treatment considerations related to atrial flutter.  Review of electrocardiograms demonstrates atrial flutter in 2012;  atrial flutter noted again 7/16 and more recently.  He is not aware that he is in atrial flutter. He has no palpitations. He is not sure if there are untoward symptoms although he does say that he is more fatigued and having was 6 months ago.   He had coronary artery bypass surgery in 1996. He has peripheral vascular disease with CEA on the left year 2000. He was noted for 5 years ago following surgery has atrial fibrillation and was started on amiodarone.  This was subsequtne;y discontinued*   Echocardiogram 2011 demonstrated an ejection fraction of 40%.  He currently is being treated with apixaban and has been on this for about 3 weeks.  Past Medical History  Diagnosis Date  . Diabetes mellitus   . Hyperlipidemia   . History of BPH   . ASHD (arteriosclerotic heart disease)     previous bypass  . Peripheral neuropathy (HCC)   . Spinal stenosis   . Hypotension     mild  . S/P CABG x 3   . Left wrist fracture   . Enlarged prostate       Surgical History:  Past Surgical History  Procedure Laterality Date  . Replacement total knee      left  . Coronary artery bypass graft  1996    x3  . Carotid endarterectomy      2010  . Back surgery  1975  . Eye surgery  2013  . Tooth extraction       Home Meds: Prior to Admission medications   Medication Sig Start Date End Date Taking? Authorizing Provider  apixaban (ELIQUIS) 5 MG TABS tablet Take 1 tablet (5 mg total) by mouth 2 (two) times daily. 05/31/15   Antonieta Iba,  MD  carvedilol (COREG) 3.125 MG tablet TAKE ONE TABLET BY MOUTH TWICE DAILY 04/26/14   Antonieta Iba, MD  finasteride (PROSCAR) 5 MG tablet Take 5 mg by mouth daily.    Historical Provider, MD  folic acid (FOLVITE) 1 MG tablet Take 1 mg by mouth daily.      Historical Provider, MD  metFORMIN (GLUCOPHAGE) 500 MG tablet Take 500 mg by mouth 2 (two) times daily with a meal.      Historical Provider, MD  simvastatin (ZOCOR) 20 MG tablet Take 1 tablet (20 mg total) by mouth at bedtime. 11/24/14   Antonieta Iba, MD  terazosin (HYTRIN) 1 MG capsule Take 1 mg by mouth at bedtime.      Historical Provider, MD  vitamin B-12 (CYANOCOBALAMIN) 250 MCG tablet Take 250 mcg by mouth daily.      Historical Provider, MD    Allergies: No Known Allergies  Social History   Social History  . Marital Status: Married    Spouse Name: N/A  . Number of Children: N/A  . Years of Education: N/A   Occupational History  . Not on file.   Social History Main Topics  . Smoking status: Never Smoker   . Smokeless tobacco: Not on file  . Alcohol Use: No  .  Drug Use: No  . Sexual Activity: Not on file   Other Topics Concern  . Not on file   Social History Narrative     Family History  Problem Relation Age of Onset  . Family history unknown: Yes     ROS:  Please see the history of present illness.     All other systems reviewed and negative.    Physical Exam: Blood pressure 130/96, pulse 89, height  (1.803 m), weight 216 lb (97.977 kg). General: Well developed, well nourished male in no acute distress. Head: Normocephalic, atraumatic, sclera non-icteric, no xanthomas, nares are without discharge. EENT: normal  Lymph Nodes:  none Neck: Negative for carotid bruits. JVD not elevated. Back:without scoliosis kyphosis  Lungs: Clear bilaterally to auscultation without wheezes, rales, or rhonchi. Breathing is unlabored. Heart: RRR with S1 S2. N  murmur . No rubs, or gallops appreciated. Abdomen:  Soft, non-tender, non-distended with normoactive bowel sounds. No hepatomegaly. No rebound/guarding. No obvious abdominal masses. Msk:  Strength and tone appear norm  tr edema.  Distal pedal pulses are 2+ and equal bilaterally. Skin: Warm and Dry Neuro: Alert and oriented X 3. CN III-XII intact Grossly normal sensory and motor function . Psych:  Responds to questions appropriately with a normal affect.      Labs: Cardiac Enzymes No results for input(s): CKTOTAL, CKMB, TROPONINI in the last 72 hours. CBC Lab Results  Component Value Date   WBC 8.2 10/17/2013   HGB 11.1* 10/17/2013   HCT 33.3* 10/17/2013   MCV 96 10/17/2013   PLT 296 10/17/2013    Radiology/Studies:  No results found.  EKG:   Atrial flutter with a atrial cycle length of 320 ms with 2 to one conduction Intervals-/12/39 Axis left -60  Assessment and Plan:   atrial flutter-persistent   Ischemic heart disease with prior bypass surgery   the patient has a history of atrial fibrillation  For which I do not have heart racing. He does have atrial flutter as noted above whether it is contributing to symptoms is not clear. The strategies for dealing with atrial flutter would be cardioversion, antiarrhythmic drugs or catheter ablation.   Given the antecedent history of atrial fibrillation and his age I am reluctant to pursue catheter ablation as a primary strategy. I would be reluctant to discontinue his anticoagulation. Hence, in the absence of symptoms catheter ablation does not make sense. As his last episode of atrial flutter was 3 or 4 years ago, infrequent cardioversion then seems the most efficient strategy and this would be determined in fact if we could demonstrate any improvement in symptoms following initial cardioversion.  Hence, we will undertake cardioversion. We will look for a change in his symptoms of fatigue. In the event that this is noted, and the maintaining sinus rhythm would have value. In the event that  he feels no better following cardioversion, then if you were to revert to atrial flutter no further antiarrhythmic treatments would be pursued Ie  cardioversion drugs or ablation.  We'll arrange for follow-up echocardiogram     Sherryl Manges

## 2015-06-21 ENCOUNTER — Ambulatory Visit (INDEPENDENT_AMBULATORY_CARE_PROVIDER_SITE_OTHER): Payer: Medicare Other | Admitting: Internal Medicine

## 2015-06-21 ENCOUNTER — Encounter: Payer: Self-pay | Admitting: Internal Medicine

## 2015-06-21 VITALS — BP 130/96 | HR 89 | Ht 71.0 in | Wt 216.0 lb

## 2015-06-21 DIAGNOSIS — I483 Typical atrial flutter: Secondary | ICD-10-CM

## 2015-06-21 DIAGNOSIS — Z01812 Encounter for preprocedural laboratory examination: Secondary | ICD-10-CM

## 2015-06-21 NOTE — Patient Instructions (Signed)
Medication Instructions: - Your physician recommends that you continue on your current medications as directed. Please refer to the Current Medication list given to you today.  Labwork: - Your physician recommends that you have lab work today: CBC/ BMP  Procedures/Testing: - Your physician has recommended that you have a Cardioversion (DCCV). Electrical Cardioversion uses a jolt of electricity to your heart either through paddles or wired patches attached to your chest. This is a controlled, usually prescheduled, procedure. Defibrillation is done under light anesthesia in the hospital, and you usually go home the day of the procedure. This is done to get your heart back into a normal rhythm. You are not awake for the procedure. Please see the instruction sheet given to you today.  Follow-Up: - pending date of cardioversion  Any Additional Special Instructions Will Be Listed Below (If Applicable).     If you need a refill on your cardiac medications before your next appointment, please call your pharmacy.

## 2015-06-22 LAB — CBC WITH DIFFERENTIAL/PLATELET
BASOS PCT: 0 % (ref 0–1)
Basophils Absolute: 0 10*3/uL (ref 0.0–0.1)
EOS PCT: 3 % (ref 0–5)
Eosinophils Absolute: 0.4 10*3/uL (ref 0.0–0.7)
HEMATOCRIT: 42.3 % (ref 39.0–52.0)
HEMOGLOBIN: 14 g/dL (ref 13.0–17.0)
LYMPHS PCT: 38 % (ref 12–46)
Lymphs Abs: 4.7 10*3/uL — ABNORMAL HIGH (ref 0.7–4.0)
MCH: 30.9 pg (ref 26.0–34.0)
MCHC: 33.1 g/dL (ref 30.0–36.0)
MCV: 93.4 fL (ref 78.0–100.0)
MONO ABS: 1.2 10*3/uL — AB (ref 0.1–1.0)
MONOS PCT: 10 % (ref 3–12)
MPV: 9.2 fL (ref 8.6–12.4)
NEUTROS ABS: 6.1 10*3/uL (ref 1.7–7.7)
NEUTROS PCT: 49 % (ref 43–77)
Platelets: 316 10*3/uL (ref 150–400)
RBC: 4.53 MIL/uL (ref 4.22–5.81)
RDW: 13.2 % (ref 11.5–15.5)
WBC: 12.4 10*3/uL — ABNORMAL HIGH (ref 4.0–10.5)

## 2015-06-22 LAB — BASIC METABOLIC PANEL
BUN: 30 mg/dL — ABNORMAL HIGH (ref 7–25)
CHLORIDE: 99 mmol/L (ref 98–110)
CO2: 23 mmol/L (ref 20–31)
CREATININE: 1.02 mg/dL (ref 0.70–1.11)
Calcium: 9.2 mg/dL (ref 8.6–10.3)
Glucose, Bld: 84 mg/dL (ref 65–99)
Potassium: 4.5 mmol/L (ref 3.5–5.3)
SODIUM: 137 mmol/L (ref 135–146)

## 2015-06-23 ENCOUNTER — Telehealth: Payer: Self-pay | Admitting: Internal Medicine

## 2015-06-23 NOTE — Telephone Encounter (Signed)
I have spoken with the patient and his wife. They are aware of date/ time/ instructions for DCCV on 06/29/15.

## 2015-06-23 NOTE — Telephone Encounter (Signed)
You are scheduled for a Cardioversion on Wednesday 06/29/15 with Dr. Mariah Milling. Please arrive at [facility] at 6:30  a.m. on the day of your procedure.  DIET INSTRUCTIONS:  Nothing to eat or drink after midnight except your medications with a sip of water.- Hold Metformin the night before and morning of your procedure  1) Labs: _____Done- 2/21_______  2) Medications:  YOU MAY TAKE ALL of your remaining medications with a small amount of water.  3) Must have a responsible person to drive you home.  4) Bring a current list of your medications and current insurance cards.    If you have any questions after you get home, please call the office at [phone]

## 2015-06-28 ENCOUNTER — Other Ambulatory Visit: Payer: Self-pay | Admitting: Cardiovascular Disease

## 2015-06-29 ENCOUNTER — Telehealth: Payer: Self-pay | Admitting: Cardiovascular Disease

## 2015-06-29 ENCOUNTER — Encounter
Admission: RE | Disposition: A | Discharge status: Home / Self Care | Payer: Self-pay | Source: Ambulatory Visit | Attending: Cardiovascular Disease

## 2015-06-29 ENCOUNTER — Ambulatory Visit: Payer: Medicare Other | Admitting: Registered Nurse

## 2015-06-29 ENCOUNTER — Ambulatory Visit
Admission: RE | Admit: 2015-06-29 | Discharge: 2015-06-29 | Disposition: A | Discharge status: Home / Self Care | Payer: Medicare Other | Source: Ambulatory Visit | Attending: Cardiovascular Disease | Admitting: Cardiovascular Disease

## 2015-06-29 ENCOUNTER — Encounter: Payer: Self-pay | Admitting: *Deleted

## 2015-06-29 DIAGNOSIS — I4892 Unspecified atrial flutter: Secondary | ICD-10-CM | POA: Diagnosis not present

## 2015-06-29 DIAGNOSIS — E119 Type 2 diabetes mellitus without complications: Secondary | ICD-10-CM | POA: Diagnosis not present

## 2015-06-29 DIAGNOSIS — E1142 Type 2 diabetes mellitus with diabetic polyneuropathy: Secondary | ICD-10-CM | POA: Diagnosis not present

## 2015-06-29 DIAGNOSIS — Z96652 Presence of left artificial knee joint: Secondary | ICD-10-CM | POA: Insufficient documentation

## 2015-06-29 DIAGNOSIS — I483 Typical atrial flutter: Secondary | ICD-10-CM | POA: Diagnosis not present

## 2015-06-29 DIAGNOSIS — Z7984 Long term (current) use of oral hypoglycemic drugs: Secondary | ICD-10-CM | POA: Insufficient documentation

## 2015-06-29 DIAGNOSIS — Z79899 Other long term (current) drug therapy: Secondary | ICD-10-CM | POA: Insufficient documentation

## 2015-06-29 DIAGNOSIS — N400 Enlarged prostate without lower urinary tract symptoms: Secondary | ICD-10-CM | POA: Diagnosis not present

## 2015-06-29 DIAGNOSIS — I2510 Atherosclerotic heart disease of native coronary artery without angina pectoris: Secondary | ICD-10-CM | POA: Diagnosis not present

## 2015-06-29 DIAGNOSIS — E785 Hyperlipidemia, unspecified: Secondary | ICD-10-CM | POA: Diagnosis not present

## 2015-06-29 DIAGNOSIS — Z9889 Other specified postprocedural states: Secondary | ICD-10-CM | POA: Insufficient documentation

## 2015-06-29 DIAGNOSIS — Z951 Presence of aortocoronary bypass graft: Secondary | ICD-10-CM | POA: Insufficient documentation

## 2015-06-29 DIAGNOSIS — Z7901 Long term (current) use of anticoagulants: Secondary | ICD-10-CM | POA: Insufficient documentation

## 2015-06-29 HISTORY — PX: ELECTROPHYSIOLOGIC STUDY: SHX172A

## 2015-06-29 SURGERY — CARDIOVERSION (CATH LAB)
Anesthesia: General

## 2015-06-29 MED ORDER — SODIUM CHLORIDE 0.9 % IV SOLN
INTRAVENOUS | Status: DC
  Administered 2015-06-29: 08:00:00 via INTRAVENOUS

## 2015-06-29 MED ORDER — PROPOFOL 10 MG/ML IV BOLUS
INTRAVENOUS | Status: DC | PRN
  Administered 2015-06-29: 50 mg via INTRAVENOUS

## 2015-06-29 NOTE — Anesthesia Preprocedure Evaluation (Addendum)
Anesthesia Evaluation  Patient identified by MRN, date of birth, ID band Patient awake    Reviewed: Allergy & Precautions, H&P , NPO status , Patient's Chart, lab work & pertinent test results  History of Anesthesia Complications Negative for: history of anesthetic complications  Airway Mallampati: II  TM Distance: >3 FB Neck ROM: full    Dental no notable dental hx. (+) Upper Dentures   Pulmonary neg pulmonary ROS,    Pulmonary exam normal        Cardiovascular + CAD, + CABG and + Peripheral Vascular Disease  Normal cardiovascular exam+ dysrhythmias Atrial Fibrillation   Hyperlipedemia   Neuro/Psych Peripheral neuropathy  Neuromuscular disease    GI/Hepatic negative GI ROS, Neg liver ROS,   Endo/Other  diabetes  Renal/GU negative Renal ROS     Musculoskeletal   Abdominal   Peds  Hematology negative hematology ROS (+)   Anesthesia Other Findings   Reproductive/Obstetrics negative OB ROS                            Anesthesia Physical Anesthesia Plan  ASA: III  Anesthesia Plan: General   Post-op Pain Management:    Induction: Intravenous  Airway Management Planned: Nasal Cannula  Additional Equipment:   Intra-op Plan:   Post-operative Plan:   Informed Consent: I have reviewed the patients History and Physical, chart, labs and discussed the procedure including the risks, benefits and alternatives for the proposed anesthesia with the patient or authorized representative who has indicated his/her understanding and acceptance.     Plan Discussed with: Anesthesiologist  Anesthesia Plan Comments:        Anesthesia Quick Evaluation

## 2015-06-29 NOTE — Anesthesia Postprocedure Evaluation (Signed)
Anesthesia Post Note  Patient: Mark Dougherty  Procedure(s) Performed: Procedure(s) (LRB): Cardioversion (N/A)  Patient location during evaluation: Cath Lab Anesthesia Type: General Level of consciousness: awake and alert Pain management: pain level controlled Vital Signs Assessment: post-procedure vital signs reviewed and stable Respiratory status: spontaneous breathing and respiratory function stable Cardiovascular status: stable Anesthetic complications: no    Last Vitals:  Filed Vitals:   06/29/15 0649 06/29/15 0753  BP: 138/73 128/75  Pulse: 69   Temp: 36.5 C   Resp:  16    Last Pain: There were no vitals filed for this visit.               KEPHART,WILLIAM K

## 2015-06-29 NOTE — Telephone Encounter (Signed)
Spoke w/ pt.  He reports that his BG is 75, he had a big breakfast this am, concerned that his DCCV has affected his sugars. Advised pt that his procedure did not affect his BG. He states "does that mean I can't sue?" Advised him to eat some lunch, he states that he has some sugar pills that he can take.  Advised him to eat something and to call his PCP, as he may have rebound hyperglycemia if he just eats sugar. He verbalizes understanding and will call back w/ any further questions or concerns.

## 2015-06-29 NOTE — Anesthesia Procedure Notes (Signed)
Date/Time: 06/29/2015 7:44 AM Performed by: Stormy Fabian Pre-anesthesia Checklist: Patient identified, Emergency Drugs available, Suction available and Patient being monitored Patient Re-evaluated:Patient Re-evaluated prior to inductionOxygen Delivery Method: Nasal cannula Intubation Type: IV induction Dental Injury: Teeth and Oropharynx as per pre-operative assessment  Comments: Nasal cannula with etCO2 monitoring

## 2015-06-29 NOTE — Discharge Instructions (Signed)
Electrical Cardioversion, Care After °Refer to this sheet in the next few weeks. These instructions provide you with information on caring for yourself after your procedure. Your health care provider may also give you more specific instructions. Your treatment has been planned according to current medical practices, but problems sometimes occur. Call your health care provider if you have any problems or questions after your procedure. °WHAT TO EXPECT AFTER THE PROCEDURE °After your procedure, it is typical to have the following sensations: °· Some redness on the skin where the shocks were delivered. If this is tender, a sunburn lotion or hydrocortisone cream may help. °· Possible return of an abnormal heart rhythm within hours or days after the procedure. °HOME CARE INSTRUCTIONS °· Take medicines only as directed by your health care provider. Be sure you understand how and when to take your medicine. °· Learn how to feel your pulse and check it often. °· Limit your activity for 48 hours after the procedure or as directed by your health care provider. °· Avoid or minimize caffeine and other stimulants as directed by your health care provider. °SEEK MEDICAL CARE IF: °· You feel like your heart is beating too fast or your pulse is not regular. °· You have any questions about your medicines. °· You have bleeding that will not stop. °SEEK IMMEDIATE MEDICAL CARE IF: °· You are dizzy or feel faint. °· It is hard to breathe or you feel short of breath. °· There is a change in discomfort in your chest. °· Your speech is slurred or you have trouble moving an arm or leg on one side of your body. °· You get a serious muscle cramp that does not go away. °· Your fingers or toes turn cold or blue. °  °This information is not intended to replace advice given to you by your health care provider. Make sure you discuss any questions you have with your health care provider. °  °Document Released: 02/04/2013 Document Revised: 05/07/2014  Document Reviewed: 02/04/2013 °Elsevier Interactive Patient Education ©2016 [employer] ° °

## 2015-06-29 NOTE — Transfer of Care (Signed)
Immediate Anesthesia Transfer of Care Note  Patient: Mark Dougherty  Procedure(s) Performed: Procedure(s): Cardioversion (N/A)  Patient Location: PACU and Short Stay  Anesthesia Type:General  Level of Consciousness: awake, alert  and oriented  Airway & Oxygen Therapy: Patient Spontanous Breathing and Patient connected to nasal cannula oxygen  Post-op Assessment: Report given to RN and Post -op Vital signs reviewed and stable  Post vital signs: Reviewed and stable  Last Vitals:  Filed Vitals:   06/29/15 0649 06/29/15 0753  BP: 138/73 128/75  Pulse: 69   Temp: 36.5 C   Resp:  16    Complications: No apparent anesthesia complications

## 2015-06-29 NOTE — CV Procedure (Addendum)
Cardioversion procedure note For atrial flutter, persistent (typical)  Procedure Details:  Consent: Risks of procedure as well as the alternatives and risks of each were explained to the (patient/caregiver). Consent for procedure obtained.  Time Out: Verified patient identification, verified procedure, site/side was marked, verified correct patient position, special equipment/implants available, medications/allergies/relevent history reviewed, required imaging and test results available. Performed  Patient placed on cardiac monitor, pulse oximetry, supplemental oxygen as necessary.  Sedation given: propofol IV, Dr. Morley Kos Pacer pads placed anterior and posterior chest.   Cardioverted 1 time(s).  Cardioverted at  120 J. Synchronized biphasic Converted to NSR   Evaluation: Findings: Post procedure EKG shows: NSR Complications: None Patient did tolerate procedure well.  Time Spent Directly with the Patient:  45 minutes   Dossie Arbour, M.D., Ph.D.

## 2015-06-29 NOTE — Telephone Encounter (Signed)
Pt calling stating he had his heart "shocked" this morning and now his blood  Pt has Sugar pills not sure if he needs to take them.  His sugar is 75 and he states that is low for him He says he had a big breakfast as he came home from procedure this morning.  Please advise.

## 2015-07-18 ENCOUNTER — Ambulatory Visit: Payer: Medicare Other | Admitting: Podiatry

## 2015-07-20 ENCOUNTER — Telehealth: Payer: Self-pay | Admitting: Cardiovascular Disease

## 2015-07-20 NOTE — Telephone Encounter (Signed)
Mark Dougherty completed for patient as in office.

## 2015-07-20 NOTE — Telephone Encounter (Signed)
Patient calling the office for samples of medication:   1.  What medication and dosage are you requesting samples for? Eliquis 5 mg po x 2 daily    2.  Are you currently out of this medication?   Yes in office now dr. Mariah Milling said he would have samples ready and to come by the office

## 2015-07-25 ENCOUNTER — Other Ambulatory Visit: Payer: Self-pay | Admitting: *Deleted

## 2015-07-25 MED ORDER — CARVEDILOL 3.125 MG PO TABS: 3.1250 mg | ORAL_TABLET | Freq: Two times a day (BID) | ORAL | Status: DC

## 2015-07-26 ENCOUNTER — Encounter: Payer: Self-pay | Admitting: Internal Medicine

## 2015-07-26 ENCOUNTER — Ambulatory Visit (INDEPENDENT_AMBULATORY_CARE_PROVIDER_SITE_OTHER): Payer: Medicare Other | Admitting: Internal Medicine

## 2015-07-26 VITALS — BP 82/60 | HR 83 | Ht 67.5 in | Wt 217.8 lb

## 2015-07-26 DIAGNOSIS — I6523 Occlusion and stenosis of bilateral carotid arteries: Secondary | ICD-10-CM

## 2015-07-26 DIAGNOSIS — I2510 Atherosclerotic heart disease of native coronary artery without angina pectoris: Secondary | ICD-10-CM | POA: Diagnosis not present

## 2015-07-26 DIAGNOSIS — I4891 Unspecified atrial fibrillation: Secondary | ICD-10-CM | POA: Diagnosis not present

## 2015-07-26 NOTE — Progress Notes (Signed)
Patient Care Team: [name], MD as PCP - General (Unknown Physician Specialty) Antonieta Iba, MD as Consulting Physician (Cardiology)     Mark Dougherty is a 82 y.o. male Seen in follow-up for atrial fibrillation and flutter with cardioversion  2/17  IHe had coronary artery bypass surgery in 1996. He has peripheral vascular disease with CEA on the left year 2000. He was noted for 5 years ago following surgery has atrial fibrillation and was started on amiodarone.  This was subsequtne;y discontinued*  . Thromboembolic risk profile notable for age-24 diabetes-1 vascular disease-1 for a CHADS-VASc score of greater than or equal to 4  He has felt better since cardioversion.  He notes some lightheadedness. He does have orthostatic intolerance.  Echocardiogram 2011 demonstrated an ejection fraction of 40%.ords and Results Reviewed*    Past Medical History  Diagnosis Date  . Diabetes mellitus   . Hyperlipidemia   . History of BPH   . ASHD (arteriosclerotic heart disease)     previous bypass  . Peripheral neuropathy (HCC)   . Spinal stenosis   . Hypotension     mild  . S/P CABG x 3   . Left wrist fracture   . Enlarged prostate     Past Surgical History  Procedure Laterality Date  . Replacement total knee      left  . Coronary artery bypass graft  1996    x3  . Carotid endarterectomy      2010  . Back surgery  1975  . Eye surgery  2013  . Tooth extraction    . Electrophysiologic study N/A 06/29/2015    Procedure: Cardioversion;  Surgeon: Antonieta Iba, MD;  Location: [facility] ORS;  Service: Cardiovascular;  Laterality: N/A;    Current Outpatient Prescriptions  Medication Sig Dispense Refill  . apixaban (ELIQUIS) 5 MG TABS tablet Take 1 tablet (5 mg total) by mouth 2 (two) times daily. 60 tablet 11  . carvedilol (COREG) 3.125 MG tablet Take 1 tablet (3.125 mg total) by mouth 2 (two) times daily. 180 tablet 3  . finasteride (PROSCAR) 5 MG tablet Take 5 mg by  mouth daily.    . folic acid (FOLVITE) 1 MG tablet Take 1 mg by mouth daily.      . metFORMIN (GLUCOPHAGE) 500 MG tablet Take 500 mg by mouth 2 (two) times daily with a meal.      . simvastatin (ZOCOR) 20 MG tablet Take 1 tablet (20 mg total) by mouth at bedtime. 90 tablet 3  . terazosin (HYTRIN) 1 MG capsule Take 1 mg by mouth at bedtime.      . vitamin B-12 (CYANOCOBALAMIN) 250 MCG tablet Take 250 mcg by mouth daily.       No current facility-administered medications for this visit.    No Known Allergies    Review of Systems negative except from HPI and PMH  Physical Exam BP 82/60 mmHg  Pulse 83  Ht 5' 7.5" (1.715 m)  Wt 217 lb 12 oz (98.771 kg)  BMI 33.58 kg/m2 Well developed and well nourished in no acute distress HENT normal E scleral and icterus clear Neck Supple JVP flat; carotids brisk and full Clear to ausculation  Regular rate and rhythm, no murmurs gallops or rub Soft with active bowel sounds No clubbing cyanosis 1+ Edema Alert and oriented, grossly normal motor and sensory function Skin Warm and Dry  ECG demonstrated sinus rhythm at 83 Intervals 20/12/38 LVH with QRS  widening Left axis deviation  Assessment and  Plan  Atrial flutter/fibrillation post cardioversion  Hypotension  Ischemic heart disease with prior bypass surgery and  LVEF 40%   The patient is holding sinus rhythm. Strategies for rhythm control will derive from the frequency of recurrent events. At this point I will let him follow-up with Dr. Knute Neu.  He is to let us know if he has recurrent symptoms or on his blood pressure machine (which she is going to buy) he notes a change in his heart rate  His blood pressure is low; he is advised to let us know if he has more lightheadedness. He might benefit from discontinuation of his carvedilol.  Without symptoms of ischemia c

## 2015-07-26 NOTE — Patient Instructions (Signed)
Medication Instructions: - Your physician recommends that you continue on your current medications as directed. Please refer to the Current Medication list given to you today.  Labwork: - none  Procedures/Testing: - none  Follow-Up: - Dr. Klein will see you back on an as needed basis.  Any Additional Special Instructions Will Be Listed Below (If Applicable).     If you need a refill on your cardiac medications before your next appointment, please call your pharmacy.   

## 2015-08-22 ENCOUNTER — Encounter: Payer: Self-pay | Admitting: Podiatry

## 2015-08-22 ENCOUNTER — Ambulatory Visit (INDEPENDENT_AMBULATORY_CARE_PROVIDER_SITE_OTHER): Payer: Medicare Other | Admitting: Podiatry

## 2015-08-22 DIAGNOSIS — B351 Tinea unguium: Secondary | ICD-10-CM

## 2015-08-22 DIAGNOSIS — M79676 Pain in unspecified toe(s): Secondary | ICD-10-CM | POA: Diagnosis not present

## 2015-08-22 DIAGNOSIS — E118 Type 2 diabetes mellitus with unspecified complications: Secondary | ICD-10-CM

## 2015-08-23 NOTE — Progress Notes (Signed)
He presents today for chief complaint of painful elongated toenails and diabetes.  Objective: Pain in limb secondary to onychomycosis toenails are thick yellow dystrophic onychomycotic pulses are palpable. Diminished DP and PT bilateral. Diminished sensation per Semmes-Weinstein monofilament just minimally so.  Assessment: Diabetic peripheral neuropathy with early angiopathy pain in limb secondary to onychomycosis.  Plan: Debridement of toenails 1 through 5 bilateral. Initiated paperwork for diabetic shoe inserts only.

## 2015-09-09 ENCOUNTER — Telehealth: Payer: Self-pay | Admitting: Internal Medicine

## 2015-09-09 NOTE — Telephone Encounter (Signed)
Patient called and is unable to afford apixaban (ELIQUIS) 5 MG TABS tablet. He is also having problem with controlling his blood sugar since he has be on Eliquis. Please call patient.

## 2015-09-12 ENCOUNTER — Telehealth: Payer: Self-pay | Admitting: Internal Medicine

## 2015-09-12 NOTE — Telephone Encounter (Signed)
Pt is calling back stating he needs to know something regarding his medication. States he called on Friday, and has not received a call back.

## 2015-09-13 NOTE — Telephone Encounter (Signed)
I called and spoke with the patient. I apologized he had not heard back from anyone, but I had been out of the office since Thursday last week. He states that he cannot afford $400/ month for Eliquis. I gave the patient and his wife the names of Pradaxa 150 mg BID/ Xarelto 20 mg QD to check the cost at the pharmacy. They also inquired about warfarin. I advised him that he may take warfarin, but that this will require ~ monthly blood checks and he will need to be careful with his diet. They would like to consider this. The patient states his blood sugar has also been some what erratic since he started Eliquis. I advised him that Dr. Graciela Husbands did look this up and couldn't find that there was a link between Eliquis and blood sugar levels. The patient is due to see his PCP tomorrow.  He will discuss his blood sugars with his PCP and also discuss warfarin and if they are able to follow this at primary care.  I advised I will pull some samples of Eliquis for him until he decides what to do, but that we could also follow his coumadin levels here as well. The patient and his wife are agreeable and we will await a call back from them.  Samples of Eliquis pulled for the patient and placed at the front desk. He is aware.

## 2015-09-30 ENCOUNTER — Telehealth: Payer: Self-pay | Admitting: Cardiovascular Disease

## 2015-09-30 NOTE — Telephone Encounter (Signed)
Pt calling stating he can't afford the medication Eliquis  Would like to know if he get some samples was getting about a months supply at the time.   He has an appointment on 11/28/15   He will be out 10/10/15

## 2015-09-30 NOTE — Telephone Encounter (Signed)
Placed samples of Eliquis at front desk for pick up.  Please see note below for pt can't afford medication.

## 2015-10-05 ENCOUNTER — Ambulatory Visit (INDEPENDENT_AMBULATORY_CARE_PROVIDER_SITE_OTHER): Payer: Medicare Other | Admitting: *Deleted

## 2015-10-05 ENCOUNTER — Telehealth: Payer: Self-pay | Admitting: Cardiovascular Disease

## 2015-10-05 DIAGNOSIS — M8430XA Stress fracture, unspecified site, initial encounter for fracture: Secondary | ICD-10-CM

## 2015-10-05 DIAGNOSIS — M79676 Pain in unspecified toe(s): Secondary | ICD-10-CM

## 2015-10-05 DIAGNOSIS — B351 Tinea unguium: Secondary | ICD-10-CM

## 2015-10-05 DIAGNOSIS — E118 Type 2 diabetes mellitus with unspecified complications: Secondary | ICD-10-CM

## 2015-10-05 NOTE — Telephone Encounter (Signed)
lmov to let patient know sooner appt was scheduled with PA Eula Listen and wanted to make sure this is ok with patient.

## 2015-10-09 NOTE — Telephone Encounter (Signed)
Would explore options for help through company, What is the price? If xarelto around the same (30 to $45), could  Change to warfarin

## 2015-10-10 ENCOUNTER — Other Ambulatory Visit: Payer: Self-pay | Admitting: *Deleted

## 2015-10-10 MED ORDER — APIXABAN 5 MG PO TABS: 5.0000 mg | ORAL_TABLET | Freq: Two times a day (BID) | ORAL | Status: DC

## 2015-10-10 NOTE — Telephone Encounter (Signed)
Patient assistance forms placed at front desk for pick up.

## 2015-10-10 NOTE — Telephone Encounter (Signed)
LMOVM to contact office concering blood thinner.

## 2015-10-12 NOTE — Telephone Encounter (Signed)
LMOVM to call office

## 2015-10-14 NOTE — Progress Notes (Signed)
Measured for diabetic shoes and insoles. 

## 2015-11-02 ENCOUNTER — Encounter: Payer: Self-pay | Admitting: Physician Assistant

## 2015-11-02 ENCOUNTER — Ambulatory Visit: Payer: Medicare Other | Admitting: Podiatry

## 2015-11-02 DIAGNOSIS — E079 Disorder of thyroid, unspecified: Secondary | ICD-10-CM | POA: Insufficient documentation

## 2015-11-02 DIAGNOSIS — I5022 Chronic systolic (congestive) heart failure: Secondary | ICD-10-CM | POA: Insufficient documentation

## 2015-11-02 DIAGNOSIS — I255 Ischemic cardiomyopathy: Secondary | ICD-10-CM | POA: Insufficient documentation

## 2015-11-02 DIAGNOSIS — E118 Type 2 diabetes mellitus with unspecified complications: Secondary | ICD-10-CM

## 2015-11-03 ENCOUNTER — Ambulatory Visit (INDEPENDENT_AMBULATORY_CARE_PROVIDER_SITE_OTHER): Payer: Medicare Other | Admitting: Physician Assistant

## 2015-11-03 ENCOUNTER — Encounter: Payer: Self-pay | Admitting: Physician Assistant

## 2015-11-03 VITALS — BP 100/70 | HR 84 | Ht 67.5 in | Wt 211.8 lb

## 2015-11-03 DIAGNOSIS — I483 Typical atrial flutter: Secondary | ICD-10-CM | POA: Diagnosis not present

## 2015-11-03 DIAGNOSIS — I480 Paroxysmal atrial fibrillation: Secondary | ICD-10-CM | POA: Diagnosis not present

## 2015-11-03 DIAGNOSIS — I2510 Atherosclerotic heart disease of native coronary artery without angina pectoris: Secondary | ICD-10-CM

## 2015-11-03 DIAGNOSIS — I6523 Occlusion and stenosis of bilateral carotid arteries: Secondary | ICD-10-CM

## 2015-11-03 DIAGNOSIS — I5022 Chronic systolic (congestive) heart failure: Secondary | ICD-10-CM

## 2015-11-03 DIAGNOSIS — Z79899 Other long term (current) drug therapy: Secondary | ICD-10-CM

## 2015-11-03 MED ORDER — APIXABAN 5 MG PO TABS: 5.0000 mg | ORAL_TABLET | Freq: Two times a day (BID) | ORAL | Status: DC

## 2015-11-03 NOTE — Progress Notes (Signed)
Cardiology Office Note Date:  11/03/2015  Patient ID:  Mark Dougherty, Mark Dougherty 1926-07-20, MRN [id number] PCP:  [name], MD  Cardiologist:  Dr. Mariah Milling, MD Electrophysiologist: Dr. Graciela Husbands, MD    Chief Complaint: Discuss cost of Eliquis   History of Present Illness: [name] is a 82 y.o. male with history of CAD s/p CABG in 1996 in [place], carotid artery stenosis s/p left-sided CEA in 2000 with residual 40-50% bilateral stenosis on 02/03/2015, DM, severe chronic back pain with prior MRI showing spinal stenosis s/p prior back surgery in mid 2000 with perioperative Afib s/p conversion to sinus rhythm on amiodarone, paroxysmal atrial flutter followed by Dr. Graciela Husbands s/p DCCV on 06/29/2015 by Dr. Mariah Milling, MD, COPD, HLD, fatigue and malaise who presents for routine follow up of his CAD and atrial flutter. He was previously hospitalized in June 2015, for weakness, cough, bronchitis, and a fall with a left radial fracture. He was noted to have a low TSH and elevated free T4. His amiodarone was discontined at that time. Most recent echo from 2011 showed and EF of 40%. He was noted to have an episode of atrial flutter in 2012, 10/2014, and again most recently in at his follow up with Dr. Mariah Milling on 05/31/2015. He could not feel the atrial flutter. He was previously anticoagulated with warfarin without acute events. He was started on Eliquis on 05/31/2015 and advised to take Coreg bid rather than daily. He saw Dr. Graciela Husbands on 06/21/2015 for evalaution of his atrial flutter and was scheduled for DCCV rather than atrial flutter ablation given his antecedent history of Afib and age. He underwent successful atrial flutter DCCV on 06/29/2015 converting to NSR. In follow up with Dr. Graciela Husbands, MD on 07/26/2015 he was feeling better since his cardioversion and was maintaining sinus rhythm.   He comes in doing well today. He has not had any further palpitations. He is tolerating his medications well. His only reason for coming in today  is to discuss the cost of Eliquis. He reports this medication is $500 through [employer]. He has filled out multiple different patient assistance forms and been declined on all as he reports he makes $146 too much. He has asked about Xarelto and Pradaxa through his insurance and at the pharmacy with these both costing $500. He has a patient assistance form from [employer] to be filled out at this time for Eliquis. If this does not make Eliquis affordable to him he knows he will have to go back on Coumadin.    Past Medical History  Diagnosis Date  . Diabetes mellitus   . Hyperlipidemia   . History of BPH   . CAD (coronary artery disease)     a. s/p CABG in 1996 in [place]  . Peripheral neuropathy (HCC)   . Spinal stenosis   . Hypotension     mild  . Left wrist fracture   . Enlarged prostate   . A-fib (HCC)     a. in perioperative state-->converted to NSR on amiodarone  . Thyroid disease     a. low TSH and elevated free T4 in June 2015, amiodarone was stopped at that time  . Atrial flutter (HCC)     a. s/p DCCV 06/29/2015  . Ischemic cardiomyopathy   . Chronic systolic CHF (congestive heart failure) (HCC)     a. echo 2011 with EF 40%  . Carotid artery stenosis     a. s/p left-sided CEA 2000    Past  Surgical History  Procedure Laterality Date  . Replacement total knee      left  . Coronary artery bypass graft  1996    x3  . Carotid endarterectomy      2010  . Back surgery  1975  . Eye surgery  2013  . Tooth extraction    . Electrophysiologic study N/A 06/29/2015    Procedure: Cardioversion;  Surgeon: Antonieta Iba, MD;  Location: [facility] ORS;  Service: Cardiovascular;  Laterality: N/A;    Current Outpatient Prescriptions  Medication Sig Dispense Refill  . carvedilol (COREG) 3.125 MG tablet Take 1 tablet (3.125 mg total) by mouth 2 (two) times daily. 180 tablet 3  . finasteride (PROSCAR) 5 MG tablet Take 5 mg by mouth daily.    . folic acid (FOLVITE) 1 MG tablet Take 1  mg by mouth daily.      . metFORMIN (GLUCOPHAGE) 500 MG tablet Take 500 mg by mouth 2 (two) times daily with a meal.      . simvastatin (ZOCOR) 20 MG tablet Take 1 tablet (20 mg total) by mouth at bedtime. 90 tablet 3  . terazosin (HYTRIN) 1 MG capsule Take 1 mg by mouth at bedtime.      . vitamin B-12 (CYANOCOBALAMIN) 250 MCG tablet Take 250 mcg by mouth daily.      Marland Kitchen apixaban (ELIQUIS) 5 MG TABS tablet Take 1 tablet (5 mg total) by mouth 2 (two) times daily. 60 tablet 11   No current facility-administered medications for this visit.    Allergies:   Review of patient's allergies indicates no known allergies.   Social History:  The patient  reports that he has never smoked. He does not have any smokeless tobacco history on file. He reports that he does not drink alcohol or use illicit drugs.   Family History:  The patient's Family history is unknown by patient.  ROS:   Review of Systems  Constitutional: Negative for fever, chills, weight loss, malaise/fatigue and diaphoresis.  HENT: Negative for congestion.   Eyes: Negative for discharge and redness.  Respiratory: Negative for cough, sputum production, shortness of breath and wheezing.   Cardiovascular: Negative for chest pain, palpitations, orthopnea, claudication, leg swelling and PND.  Gastrointestinal: Negative for heartburn, nausea, vomiting and abdominal pain.  Musculoskeletal: Negative for myalgias and falls.  Skin: Negative for rash.  Neurological: Negative for dizziness, tingling, tremors, sensory change, speech change, focal weakness, loss of consciousness and weakness.  Endo/Heme/Allergies: Does not bruise/bleed easily.  Psychiatric/Behavioral: Negative for substance abuse. The patient is not nervous/anxious.   All other systems reviewed and are negative.    PHYSICAL EXAM:  VS:  BP 100/70 mmHg  Pulse 84  Ht 5' 7.5" (1.715 m)  Wt 211 lb 12.8 oz (96.072 kg)  BMI 32.66 kg/m2 BMI: Body mass index is 32.66  kg/(m^2).  Physical Exam  Constitutional: He is oriented to person, place, and time. He appears well-developed and well-nourished.  HENT:  Head: Normocephalic and atraumatic.  Eyes: Right eye exhibits no discharge. Left eye exhibits no discharge.  Neck: Normal range of motion. No JVD present.  Cardiovascular: Normal rate, regular rhythm, S1 normal, S2 normal and normal heart sounds.  Exam reveals no gallop, no distant heart sounds, no friction rub, no midsystolic click and no opening snap.   No murmur heard. Pulmonary/Chest: Effort normal and breath sounds normal. No respiratory distress. He has no decreased breath sounds. He has no wheezes. He has no rales. He exhibits no tenderness.  Abdominal: Soft. He exhibits no distension. There is no tenderness.  Musculoskeletal: He exhibits no edema.  Neurological: He is alert and oriented to person, place, and time.  Skin: Skin is warm and dry. No cyanosis. Nails show no clubbing.  Psychiatric: He has a normal mood and affect. His speech is normal and behavior is normal. Judgment and thought content normal.    EKG:  Was ordered and interpreted by me today. Shows NSR, 85 bpm, left anterior fascicular block, poor R wave progression, nonspecific lateral st/t changes   Recent Labs: 06/21/2015: BUN 30*; Creat 1.02; Hemoglobin 14.0; Platelets 316; Potassium 4.5; Sodium 137   CrCl: 70.37 based on bmet from 06/21/2015    Wt Readings from Last 3 Encounters:  11/03/15 211 lb 12.8 oz (96.072 kg)  07/26/15 217 lb 12 oz (98.771 kg)  06/29/15 216 lb (97.977 kg)     Other studies reviewed: Additional studies/records reviewed today include: summarized above  ASSESSMENT AND PLAN:  1. CAD s/p CABG as above: No symptoms concerning for angina at this time. On Eliquis in place of aspirin. Continue Coreg 3.125 mg bid, blood pressure precludes further titration of this at this time. No plans for ischemic evaluation at this time.   2. Atrial flutter: Currently  maintaining sinus rhythm with a heart rate in the 80's. Continue Eliquis 5 mg bid (he does not meet 2/3 dosing criteria for reduced dose). Continue Coreg 3.125 mg bid as above. CHADS2VASc at least 4 (age x 2, DM, vascular disease). His only concern for this office visit was attempting to get his Eliquis affordable. He has checked with his [employer], as well as the pharmacy and this medication is $500 per month. He has also checked on the prices for Xarelto and Pradaxa, with each of them being $500 per month as well. He has filled out some generic patient assistance forms and been declined over making $146 too much. He has a form for [employer] [employer] in [place] to fill out. He comes in today with a patient assistance form for [employer] for me to fill out in a effort to se eif he will qualify for their patient assistance. He was given Eliquis samples to cover him while we wait to see if he will qualify. If he does not qualify his only affordable recourse is to go back on Coumadin, which he was on previously without issues. He understands he will be responsible for co-payment to check his INR. We will defer Coumadin at this time until we see if Eliquis will become affordable for him with the above patient assistance form.   3. Chronic systolic CHF: He does not appear to be volume overloaded at this time. Continue Coreg as above. Plan to discuss need for echo to evaluate LVSF now that he is in sinus rhythm at next office visit as the patient's only concern today was the above cost issue with Eliquis.   4. Carotid stenosis: Follow up carotid ultrasound 01/2017.   5. HLD: Continue simvastatin.   6. DM2 with neuropathy: Per PCP  Disposition: F/u with Dr. Mariah Milling, MD or myself in 4 weeks   Current medicines are reviewed at length with the patient today.  The patient did not have any concerns regarding medicines.  Elinor Dodge PA-C 11/03/2015 2:57 PM     [facility] -  [place] [address] [place], [place] [address] [phone]

## 2015-11-03 NOTE — Patient Instructions (Signed)
Medication Instructions:  Your physician recommends that you continue on your current medications as directed. Please refer to the Current Medication list given to you today.   Labwork: none  Testing/Procedures: none  Follow-Up: Your physician recommends that you schedule a follow-up appointment in: 1 month with Dr. Mariah Milling or Eula Listen, PA-C   Any Other Special Instructions Will Be Listed Below (If Applicable).     If you need a refill on your cardiac medications before your next appointment, please call your pharmacy.

## 2015-11-18 ENCOUNTER — Telehealth: Payer: Self-pay | Admitting: *Deleted

## 2015-11-18 NOTE — Progress Notes (Signed)
Dispensed diabetic shoes and 3 pairs of insoles. Instructions were reviewed and a copy was given to the patient. Patient to reappointment for regularly scheduled diabetic foot care visits or if she experiences any trouble with her diabetic shoes. 

## 2015-11-18 NOTE — Telephone Encounter (Signed)
Pt has been approved for Eliquis 11/16/15-12/31/17assistance program.

## 2015-11-21 ENCOUNTER — Ambulatory Visit: Payer: Medicare Other | Admitting: Podiatry

## 2015-11-23 ENCOUNTER — Other Ambulatory Visit: Payer: Self-pay | Admitting: *Deleted

## 2015-11-23 MED ORDER — SIMVASTATIN 20 MG PO TABS: 20.0000 mg | ORAL_TABLET | Freq: Every day | ORAL | 3 refills | Status: DC

## 2015-11-28 ENCOUNTER — Ambulatory Visit: Payer: Medicare Other | Admitting: Cardiovascular Disease

## 2015-11-30 ENCOUNTER — Ambulatory Visit (INDEPENDENT_AMBULATORY_CARE_PROVIDER_SITE_OTHER): Payer: Medicare Other | Admitting: Podiatry

## 2015-11-30 DIAGNOSIS — E118 Type 2 diabetes mellitus with unspecified complications: Secondary | ICD-10-CM

## 2015-12-05 ENCOUNTER — Encounter: Payer: Self-pay | Admitting: Podiatry

## 2015-12-05 ENCOUNTER — Ambulatory Visit (INDEPENDENT_AMBULATORY_CARE_PROVIDER_SITE_OTHER): Payer: Medicare Other | Admitting: Podiatry

## 2015-12-05 DIAGNOSIS — M79676 Pain in unspecified toe(s): Secondary | ICD-10-CM | POA: Diagnosis not present

## 2015-12-05 DIAGNOSIS — B351 Tinea unguium: Secondary | ICD-10-CM

## 2015-12-05 NOTE — Progress Notes (Signed)
He presents today with chief complaint of painful elongated toenails. He states the day today is is a [age] birthday.  Objective: Vital signs are stable alert and oriented 3. Pulses are palpable. Neurologic sensorium is intact. Deep tendon reflexes are intact. Muscle strength is normal. Toenails are thick yellow dystrophic onychomycotic sharp innervated nail margins. Diabetes is well controlled without complications.  Assessment: Diabetes mellitus no complications. Painless secondary to onychomycosis.  Plan: Debridement of toenails 1 through 5 bilateral.

## 2015-12-09 ENCOUNTER — Ambulatory Visit (INDEPENDENT_AMBULATORY_CARE_PROVIDER_SITE_OTHER): Payer: Medicare Other | Admitting: Podiatry

## 2015-12-09 DIAGNOSIS — E118 Type 2 diabetes mellitus with unspecified complications: Secondary | ICD-10-CM

## 2015-12-09 NOTE — Progress Notes (Signed)
Patient states that he cannot fit his foot into the show well and that the shoe is really tight. Betha, CPED, re-measured and will order a different style of shoe. Will contact pt once they arrive.

## 2015-12-09 NOTE — Progress Notes (Signed)
Patient presents today to pick up shoes that were reordered for a different style. Fit was satisfactory and patient was happy.

## 2015-12-12 ENCOUNTER — Ambulatory Visit: Payer: Medicare Other | Admitting: Physician Assistant

## 2016-01-06 ENCOUNTER — Ambulatory Visit (INDEPENDENT_AMBULATORY_CARE_PROVIDER_SITE_OTHER): Payer: Medicare Other | Admitting: Cardiovascular Disease

## 2016-01-06 ENCOUNTER — Encounter: Payer: Self-pay | Admitting: Cardiovascular Disease

## 2016-01-06 VITALS — BP 108/60 | HR 78 | Ht 71.0 in | Wt 213.0 lb

## 2016-01-06 DIAGNOSIS — I5022 Chronic systolic (congestive) heart failure: Secondary | ICD-10-CM

## 2016-01-06 DIAGNOSIS — I6523 Occlusion and stenosis of bilateral carotid arteries: Secondary | ICD-10-CM | POA: Diagnosis not present

## 2016-01-06 DIAGNOSIS — E785 Hyperlipidemia, unspecified: Secondary | ICD-10-CM | POA: Diagnosis not present

## 2016-01-06 DIAGNOSIS — I4891 Unspecified atrial fibrillation: Secondary | ICD-10-CM | POA: Diagnosis not present

## 2016-01-06 DIAGNOSIS — I2510 Atherosclerotic heart disease of native coronary artery without angina pectoris: Secondary | ICD-10-CM | POA: Diagnosis not present

## 2016-01-06 DIAGNOSIS — E118 Type 2 diabetes mellitus with unspecified complications: Secondary | ICD-10-CM

## 2016-01-06 DIAGNOSIS — I483 Typical atrial flutter: Secondary | ICD-10-CM

## 2016-01-06 NOTE — Progress Notes (Signed)
Cardiology Office Note  Date:  01/06/2016   ID:  Mark NeasWalter L Dougherty, DOB 06/08/1926, MRN [id number]  PCP:  Jaclyn ShaggyATE,DENNY C, MD   Chief Complaint  Patient presents with  . Other    6 month follow up. Meds reviewed by the pt. verbally. "doing well."     HPI:  82 year old with history of coronary artery disease, bypass [place] in [place], peripheral vascular disease with CEA on the left in 2000 on the left, long history of diabetes, severe chronic back pain with MRI showing spinal stenosis, hyperlipidemia, with recent back surgery 4 to 5 years ago with complications including atrial fibrillation in the perioperative, postoperative period started on amiodarone with conversion to sinus rhythm, with a [facility] for dehydration, hypotension and malaise. He presents for routine followup of his coronary artery disease.  hospital admission February 2016 for failure to thrive, dehydration.  recent EKG showing atrial flutter, underwent cardioversion which was successful in restoring normal sinus rhythm  In follow-up today he reports that he is doing well, reports breathing is stable Feels he is maintaining normal sinus rhythm Recently Seen by dermatology Ear and right hand, nose, stitches  SOB resolved after cardioversion Denies any significant lower extremity edema, not on a diuretic Denies any orthostasis, tolerating Coreg 3.125 mg twice a day Family who presents with him today reports he is doing well, no complaints Tolerating anticoagulation  EKG on today's visit shows normal sinus rhythm with rate 78 bpm, rare PVC, poor R-wave progression to the anterior precordial leads, left anterior fascicular block  Other past medical history Total cholesterol August 2015 was 130   hospitalization in June 19th 2015 for weakness, cough, bronchitis, fall with left radial fracture. Noted to have low TSH, elevated free T4. Amiodarone was held. Also with mild renal insufficiency,  elevated sugars. He was treated with antibiotics with improvement of his symptoms.  Previous episodes of COPD exacerbation in the hospital, he did improve with antibiotics and steroid taper  he uses a cane. History of falls in the past .  Total cholesterol 153 last year.   Notes indicate an ejection fraction of 40% in March of 2011 by echocardiogram.  MRI of his back shows severe spinal stenosis at L2 and L3, other foraminal stenoses of the lumbar region.  PMH:   has a past medical history of A-fib (HCC); Atrial flutter (HCC); CAD (coronary artery disease); Carotid artery stenosis; Chronic systolic CHF (congestive heart failure) (HCC); Diabetes mellitus; Enlarged prostate; History of BPH; Hyperlipidemia; Hypotension; Ischemic cardiomyopathy; Left wrist fracture; Peripheral neuropathy (HCC); Spinal stenosis; and Thyroid disease.  PSH:    Past Surgical History:  Procedure Laterality Date  . BACK SURGERY  1975  . CAROTID ENDARTERECTOMY     2010  . CORONARY ARTERY BYPASS GRAFT  1996   x3  . ELECTROPHYSIOLOGIC STUDY N/A 06/29/2015   Procedure: Cardioversion;  Surgeon: Antonieta Ibaimothy J Shamiracle Gorden, MD;  Location: [facility] ORS;  Service: Cardiovascular;  Laterality: N/A;  . EYE SURGERY  2013  . REPLACEMENT TOTAL KNEE     left  . TOOTH EXTRACTION      Current Outpatient Prescriptions  Medication Sig Dispense Refill  . apixaban (ELIQUIS) 5 MG TABS tablet Take 1 tablet (5 mg total) by mouth 2 (two) times daily. 60 tablet 11  . carvedilol (COREG) 3.125 MG tablet Take 1 tablet (3.125 mg total) by mouth 2 (two) times daily. 180 tablet 3  . finasteride (PROSCAR) 5 MG tablet Take 5 mg by  mouth daily.    . folic acid (FOLVITE) 1 MG tablet Take 1 mg by mouth daily.      . metFORMIN (GLUCOPHAGE) 500 MG tablet Take 500 mg by mouth 2 (two) times daily with a meal.      . simvastatin (ZOCOR) 20 MG tablet Take 1 tablet (20 mg total) by mouth at bedtime. 90 tablet 3  . terazosin (HYTRIN) 1 MG capsule Take 1 mg by  mouth at bedtime.      . vitamin B-12 (CYANOCOBALAMIN) 250 MCG tablet Take 250 mcg by mouth daily.       No current facility-administered medications for this visit.      Allergies:   Review of patient's allergies indicates no known allergies.   Social History:  The patient  reports that he has never smoked. He has quit using smokeless tobacco. He reports that he does not drink alcohol or use drugs.   Family History:   Family history is unknown by patient.    Review of Systems: Review of Systems  Constitutional: Negative.   Respiratory: Negative.   Cardiovascular: Negative.   Gastrointestinal: Negative.   Musculoskeletal: Negative.   Neurological: Negative.   Psychiatric/Behavioral: Negative.   All other systems reviewed and are negative.    PHYSICAL EXAM: VS:  BP 108/60 (BP Location: Left Arm, Patient Position: Sitting, Cuff Size: Normal)   Pulse 78   Ht 5\' 11"  (1.803 m)   Wt 213 lb (96.6 kg)   BMI 29.71 kg/m  , BMI Body mass index is 29.71 kg/m. GEN: Well nourished, well developed, in no acute distress  HEENT: normal  Neck: no JVD, carotid bruits, or masses Cardiac: RRR; 1-2+ sem RSB,  No rubs, or gallops,no edema  Respiratory:  Coarse breath sounds at the bases, mildly decreased throughout normal work of breathing GI: soft, nontender, nondistended, + BS MS: no deformity or atrophy  Skin: warm and dry, no rash Neuro:  Strength and sensation are intact Psych: euthymic mood, full affect    Recent Labs: 06/21/2015: BUN 30; Creat 1.02; Hemoglobin 14.0; Platelets 316; Potassium 4.5; Sodium 137    Lipid Panel No results found for: CHOL, HDL, LDLCALC, TRIG    Wt Readings from Last 3 Encounters:  01/06/16 213 lb (96.6 kg)  11/03/15 211 lb 12.8 oz (96.1 kg)  07/26/15 217 lb 12 oz (98.8 kg)       ASSESSMENT AND PLAN:  Atrial fibrillation, unspecified type (HCC) - Plan: EKG 12-Lead Maintaining normal sinus rhythm, tolerating anticoagulation We'll continue  low-dose beta blocker  Coronary artery disease involving native coronary artery of native heart without angina pectoris - Plan: EKG 12-Lead Currently with no symptoms of angina. No further workup at this time. Continue current medication regimen.  Hyperlipidemia Currently on Zocor 20 mg daily Goal LDL less than 70  Carotid stenosis, bilateral Carotid stenosis followed in [place]. Scheduled for repeat carotid ultrasound in October  Typical atrial flutter [facility]) Recent cardioversion, maintaining normal sinus rhythm Tolerating anticoagulation, eliquis provided free from the company  Chronic systolic CHF (congestive heart failure) (HCC) Appears relatively euvolemic. Currently not on Lasix No significant leg edema  Controlled type 2 diabetes mellitus with complication, without long-term current use of insulin (HCC)   Total encounter time more than 25 minutes  Greater than 50% was spent in counseling and coordination of care with the patient    Disposition:   F/U  6 months   Orders Placed This Encounter  Procedures  . EKG 12-Lead  Signed, Dossie Arbour, M.D., Ph.D. 01/06/2016  [facility] [place], [place] [phone]

## 2016-01-06 NOTE — Patient Instructions (Signed)

## 2016-02-20 ENCOUNTER — Telehealth (INDEPENDENT_AMBULATORY_CARE_PROVIDER_SITE_OTHER): Payer: Self-pay | Admitting: *Deleted

## 2016-02-20 NOTE — Telephone Encounter (Signed)
Received call from [employer] wanting to know if you have received a written rx for Knee brace. Please call [phone]

## 2016-02-21 NOTE — Telephone Encounter (Signed)
Called to let them know we have not yet recieved

## 2016-02-28 ENCOUNTER — Telehealth (INDEPENDENT_AMBULATORY_CARE_PROVIDER_SITE_OTHER): Payer: Self-pay | Admitting: *Deleted

## 2016-02-28 NOTE — Telephone Encounter (Signed)
Received a called from [employer] again regarding pt order for a knee brace. I relayed the message back to them that we never received the first order and that we called to get the office to re fax the order. She understood and stated she was going to re fax the order today. Attn: Dr. Magnus Ivan

## 2016-02-29 ENCOUNTER — Encounter (INDEPENDENT_AMBULATORY_CARE_PROVIDER_SITE_OTHER): Payer: Self-pay

## 2016-02-29 ENCOUNTER — Ambulatory Visit (INDEPENDENT_AMBULATORY_CARE_PROVIDER_SITE_OTHER): Payer: Medicare Other | Admitting: Physician Assistant

## 2016-03-05 NOTE — Telephone Encounter (Signed)
[facility] [employer] called regarding the status of this fax to see if it had been received and what the turn around time is for the knee brace.

## 2016-03-07 ENCOUNTER — Ambulatory Visit: Payer: Medicare Other | Admitting: Podiatry

## 2016-03-08 NOTE — Telephone Encounter (Signed)
[employer] called again about fax. [employer] stated this is the 10th call about this fax and it is important to get a call back today.

## 2016-03-09 NOTE — Telephone Encounter (Signed)
Called and LM with [employer] to maybe trying to email me to see if I get order

## 2016-03-12 ENCOUNTER — Other Ambulatory Visit: Payer: Self-pay | Admitting: Cardiovascular Disease

## 2016-03-12 DIAGNOSIS — I6523 Occlusion and stenosis of bilateral carotid arteries: Secondary | ICD-10-CM

## 2016-03-12 NOTE — Telephone Encounter (Signed)
[employer] stated this could not be emailed. I recommended possibly sending this thru the mail since the fax doesn't seem to be going thru. [employer] is requesting a call back

## 2016-03-14 ENCOUNTER — Encounter (INDEPENDENT_AMBULATORY_CARE_PROVIDER_SITE_OTHER): Payer: Self-pay

## 2016-03-14 ENCOUNTER — Ambulatory Visit (INDEPENDENT_AMBULATORY_CARE_PROVIDER_SITE_OTHER): Payer: Medicare Other | Admitting: Orthopaedic Surgery

## 2016-03-15 NOTE — Telephone Encounter (Signed)
Spoke with [employer] letting them know that patient hasn't been seen for his knee in several years. They state he needs to be seen for insurance purposes before we can sign for the brace

## 2016-03-29 ENCOUNTER — Ambulatory Visit: Payer: Medicare Other

## 2016-03-29 DIAGNOSIS — I6523 Occlusion and stenosis of bilateral carotid arteries: Secondary | ICD-10-CM

## 2016-04-03 ENCOUNTER — Telehealth: Payer: Self-pay | Admitting: *Deleted

## 2016-04-03 ENCOUNTER — Other Ambulatory Visit: Payer: Self-pay | Admitting: *Deleted

## 2016-04-03 DIAGNOSIS — I480 Paroxysmal atrial fibrillation: Secondary | ICD-10-CM

## 2016-04-03 DIAGNOSIS — I483 Typical atrial flutter: Secondary | ICD-10-CM

## 2016-04-03 MED ORDER — APIXABAN 5 MG PO TABS: 5.0000 mg | ORAL_TABLET | Freq: Two times a day (BID) | ORAL | 5 refills | Status: DC

## 2016-04-03 NOTE — Telephone Encounter (Signed)
I spoke with pt today making him aware that his [employer] Assistance Program will expire 04/29/16. He is aware that he will need to Reapply in order to get assistance this yr. He requested for me to mail him the [employer] forms to his home address. I confirmed address and all information with him. He will return forms once complete.

## 2016-04-03 NOTE — Telephone Encounter (Signed)
Requested Prescriptions   Signed Prescriptions Disp Refills  . apixaban (ELIQUIS) 5 MG TABS tablet 180 tablet 5    Sig: Take 1 tablet (5 mg total) by mouth 2 (two) times daily.    Authorizing Provider: Sondra Barges    Ordering User: Kendrick Fries

## 2016-04-09 NOTE — Telephone Encounter (Signed)
Patient has not received these forms will come by office please call when ready for pick up.

## 2016-04-11 NOTE — Telephone Encounter (Signed)
Patient assistance forms have been received and have been faxed to [facility], Awaiting approval.

## 2016-04-26 NOTE — Telephone Encounter (Signed)
Patient calling to check the status of medication assistance approval.  He is concerned that it is near to the end of the month.

## 2016-04-26 NOTE — Telephone Encounter (Signed)
LMOM we have not heard a reply on the patient assistance forms yet.  Told the patient he may need to contact the company to see if he is qualified.

## 2016-05-11 NOTE — Telephone Encounter (Signed)
Pt has been approved for patient assistance program.  05/10/16-04/29/17.

## 2016-05-16 ENCOUNTER — Ambulatory Visit (INDEPENDENT_AMBULATORY_CARE_PROVIDER_SITE_OTHER): Payer: Medicare Other | Admitting: Orthopaedic Surgery

## 2016-05-19 ENCOUNTER — Ambulatory Visit (INDEPENDENT_AMBULATORY_CARE_PROVIDER_SITE_OTHER): Payer: Medicare Other | Admitting: Orthopaedic Surgery

## 2016-05-19 ENCOUNTER — Encounter (INDEPENDENT_AMBULATORY_CARE_PROVIDER_SITE_OTHER): Payer: Self-pay | Admitting: Orthopaedic Surgery

## 2016-05-19 DIAGNOSIS — M25512 Pain in left shoulder: Secondary | ICD-10-CM

## 2016-05-19 DIAGNOSIS — G8929 Other chronic pain: Secondary | ICD-10-CM

## 2016-05-19 MED ORDER — HYDROCODONE-ACETAMINOPHEN 5-325 MG PO TABS: 1.0000 | ORAL_TABLET | Freq: Two times a day (BID) | ORAL | 0 refills | Status: DC | PRN

## 2016-05-19 NOTE — Progress Notes (Signed)
Office Visit Note   Patient: Mark Dougherty           Date of Birth: Oct 02, 1926           MRN: [id number] Visit Date: 05/19/2016              Requested by: [name], MD [address]   [place], [place] [address] PCP: [name], MD   Assessment & Plan: Visit Diagnoses:  1. Chronic left shoulder pain     Plan:He is really not it should injection. He would just like to try to occasional hydrocodone which helps him the most. He is 83 years old and he use these things sparingly. The last time we gave him a prescription for pain medicine was over 6 months ago. Also, steroid injections often increase his blood glucose 2 much.  Follow-Up Instructions: Return if symptoms worsen or fail to improve.   Orders:  No orders of the defined types were placed in this encounter.  Meds ordered this encounter  Medications  . HYDROcodone-acetaminophen (NORCO/VICODIN) 5-325 MG tablet    Sig: Take 1-2 tablets by mouth 2 (two) times daily as needed for moderate pain.    Dispense:  90 tablet    Refill:  0      Procedures: No procedures performed   Clinical Data: No additional findings.   Subjective: Chief Complaint  Patient presents with  . Left Shoulder - Pain   The patient is well-known to me. He has chronic back pain as well as bilateral shoulder pain and knee pain. His main focus his left shoulder today. It's been bothering him chronically. He's had injections in the past but it does increase his blood glucose 2 much. He like to try just some pain medicine today was like a prescription for some hydrocodone today. He is 83 years old. HPI  Review of Systems He denies any chest pain, shortness of breath, headache, fever, chills, nausea, vomiting.  Objective: Vital Signs: There were no vitals taken for this visit.  Physical Exam He does ambulate a cane. He is alert and oriented 3. Ortho Exam Examination of his left shoulder does show chronic rotator cuff  deficiency. Specialty Comments:  No specialty comments available.  Imaging: No results found.   PMFS History: Patient Active Problem List   Diagnosis Date Noted  . Chronic systolic CHF (congestive heart failure) (HCC)   . Ischemic cardiomyopathy   . Thyroid disease   . Typical atrial flutter (HCC)   . Atrial flutter (HCC) 05/31/2015  . Neuropathy (HCC) 05/31/2015  . Atrial fibrillation (HCC) 01/04/2011  . Congestive dilated cardiomyopathy (HCC) 09/28/2010  . Dehydration 09/14/2010  . Diabetes mellitus type 2, controlled, with complications (HCC) 01/09/2010  . Hyperlipidemia 01/09/2010  . CAD (coronary artery disease) 01/09/2010  . Carotid stenosis 01/09/2010   Past Medical History:  Diagnosis Date  . A-fib (HCC)    a. in perioperative state-->converted to NSR on amiodarone  . Atrial flutter (HCC)    a. s/p DCCV 06/29/2015  . CAD (coronary artery disease)    a. s/p CABG in 1996 in [place]  . Carotid artery stenosis    a. s/p left-sided CEA 2000  . Chronic systolic CHF (congestive heart failure) (HCC)    a. echo 2011 with EF 40%  . Diabetes mellitus   . Enlarged prostate   . History of BPH   . Hyperlipidemia   . Hypotension    mild  . Ischemic cardiomyopathy   .  Left wrist fracture   . Peripheral neuropathy (HCC)   . Spinal stenosis   . Thyroid disease    a. low TSH and elevated free T4 in June 2015, amiodarone was stopped at that time    Family History  Problem Relation Age of Onset  . Family history unknown: Yes    Past Surgical History:  Procedure Laterality Date  . BACK SURGERY  1975  . CAROTID ENDARTERECTOMY     2010  . CORONARY ARTERY BYPASS GRAFT  1996   x3  . ELECTROPHYSIOLOGIC STUDY N/A 06/29/2015   Procedure: Cardioversion;  Surgeon: Antonieta Iba, MD;  Location: [facility] ORS;  Service: Cardiovascular;  Laterality: N/A;  . EYE SURGERY  2013  . REPLACEMENT TOTAL KNEE     left  . TOOTH EXTRACTION     Social History   Occupational History  . Not on  file.   Social History Main Topics  . Smoking status: Never Smoker  . Smokeless tobacco: Former Neurosurgeon  . Alcohol use No  . Drug use: No  . Sexual activity: Not Currently

## 2016-07-04 ENCOUNTER — Ambulatory Visit: Payer: Medicare Other | Admitting: Cardiovascular Disease

## 2016-07-05 ENCOUNTER — Encounter: Payer: Self-pay | Admitting: Cardiovascular Disease

## 2016-07-05 ENCOUNTER — Ambulatory Visit (INDEPENDENT_AMBULATORY_CARE_PROVIDER_SITE_OTHER): Payer: Medicare Other | Admitting: Cardiovascular Disease

## 2016-07-05 VITALS — BP 100/54 | HR 83 | Ht 71.0 in | Wt 188.0 lb

## 2016-07-05 DIAGNOSIS — I2510 Atherosclerotic heart disease of native coronary artery without angina pectoris: Secondary | ICD-10-CM | POA: Diagnosis not present

## 2016-07-05 DIAGNOSIS — E782 Mixed hyperlipidemia: Secondary | ICD-10-CM | POA: Diagnosis not present

## 2016-07-05 DIAGNOSIS — I483 Typical atrial flutter: Secondary | ICD-10-CM

## 2016-07-05 DIAGNOSIS — I5022 Chronic systolic (congestive) heart failure: Secondary | ICD-10-CM

## 2016-07-05 DIAGNOSIS — E118 Type 2 diabetes mellitus with unspecified complications: Secondary | ICD-10-CM

## 2016-07-05 DIAGNOSIS — I480 Paroxysmal atrial fibrillation: Secondary | ICD-10-CM

## 2016-07-05 DIAGNOSIS — M1711 Unilateral primary osteoarthritis, right knee: Secondary | ICD-10-CM

## 2016-07-05 DIAGNOSIS — I6523 Occlusion and stenosis of bilateral carotid arteries: Secondary | ICD-10-CM | POA: Diagnosis not present

## 2016-07-05 NOTE — Progress Notes (Signed)
Cardiology Office Note  Date:  07/05/2016   ID:  Mark Dougherty, DOB [birth year], MRN [id number]  PCP:  [name], MD   Chief Complaint  Patient presents with  . other    6 month follow up. Patient c/o swelling in both legs and SOB. Meds reviewed verbally with patient.     HPI:  83 year old with history of coronary artery disease, bypass surgery in 1996 in [place], peripheral vascular disease with CEA on the left in 2000 on the left, long history of diabetes, severe chronic back pain with MRI showing spinal stenosis, hyperlipidemia, with recent back surgery 4 to 5 years ago with complications including atrial fibrillation in the perioperative, postoperative period started on amiodarone with conversion to sinus rhythm, with a repeat admission to [facility] for dehydration, hypotension and malaise. He presents for routine followup of his coronary artery disease. hospital admission February 2016 for failure to thrive, dehydration. Previous EKG showing atrial flutter, underwent cardioversion which was successful in restoring normal sinus rhythm Last echocardiogram 2011 showing ejection fraction 40%  On his last clinic visit September 2017 he was maintaining normal sinus rhythm Eliquis provided free from the company Appeared euvolemic SOB resolved after cardioversion  In follow-up today he reports that he is doing well, reports breathing is stable His biggest complaint is right knee pain Denies any tachycardia concerning for arrhythmia Reports he has very mild leg swelling on the right, no significant swelling on the left not on a diuretic Denies any orthostasis, tolerating Coreg 3.125 mg twice a day Blood pressure running low Family who presents with him today reports he is doing well, no complaints Tolerating anticoagulation, no recent falls Walks with a walker, no regular exercise  Lab work from primary care reviewed with him in detail showing creatinine 1.3, normal LFTs, total  cholesterol 135, LDL 64, hemoglobin A1c 6.4  EKG personally reviewed by myself on today's visit shows normal sinus rhythm with rate 84 bpm, poor R-wave progression to the anterior precordial leads, left anterior fascicular block  Other past medical history Total cholesterol August 2015 was 130  hospitalization in June 19th 2015 for weakness, cough, bronchitis, fall with left radial fracture. Noted to have low TSH, elevated free T4. Amiodarone was held. Also with mild renal insufficiency, elevated sugars. He was treated with antibiotics with improvement of his symptoms.  Previous episodes of COPD exacerbation in the hospital, he did improve with antibiotics and steroid taper  he uses a cane. History of falls in the past . Total cholesterol 153 last year.   Notes indicate an ejection fraction of 40% in March of 2011 by echocardiogram.  MRI of his back shows severe spinal stenosis at L2 and L3, other foraminal stenoses of the lumbar region.  PMH:   has a past medical history of A-fib (HCC); Atrial flutter (HCC); CAD (coronary artery disease); Carotid artery stenosis; Chronic systolic CHF (congestive heart failure) (HCC); Diabetes mellitus; Enlarged prostate; History of BPH; Hyperlipidemia; Hypotension; Ischemic cardiomyopathy; Left wrist fracture; Peripheral neuropathy (HCC); Spinal stenosis; and Thyroid disease.  PSH:    Past Surgical History:  Procedure Laterality Date  . BACK SURGERY  1975  . CAROTID ENDARTERECTOMY     2010  . CORONARY ARTERY BYPASS GRAFT  1996   x3  . ELECTROPHYSIOLOGIC STUDY N/A 06/29/2015   Procedure: Cardioversion;  Surgeon: Antonieta Iba, MD;  Location: [facility] ORS;  Service: Cardiovascular;  Laterality: N/A;  . EYE SURGERY  2013  . REPLACEMENT TOTAL KNEE  left  . TOOTH EXTRACTION      Current Outpatient Prescriptions  Medication Sig Dispense Refill  . apixaban (ELIQUIS) 5 MG TABS tablet Take 1 tablet (5 mg total) by mouth 2 (two) times daily. 180  tablet 5  . carvedilol (COREG) 3.125 MG tablet Take 1 tablet (3.125 mg total) by mouth 2 (two) times daily. 180 tablet 3  . finasteride (PROSCAR) 5 MG tablet Take 5 mg by mouth daily.    . folic acid (FOLVITE) 1 MG tablet Take 1 mg by mouth daily.      Marland Kitchen HYDROcodone-acetaminophen (NORCO/VICODIN) 5-325 MG tablet Take 1-2 tablets by mouth 2 (two) times daily as needed for moderate pain. 90 tablet 0  . metFORMIN (GLUCOPHAGE) 500 MG tablet Take 500 mg by mouth 2 (two) times daily with a meal.      . simvastatin (ZOCOR) 20 MG tablet Take 1 tablet (20 mg total) by mouth at bedtime. 90 tablet 3  . terazosin (HYTRIN) 1 MG capsule Take 1 mg by mouth at bedtime.      . vitamin B-12 (CYANOCOBALAMIN) 250 MCG tablet Take 250 mcg by mouth daily.       No current facility-administered medications for this visit.      Allergies:   Patient has no known allergies.   Social History:  The patient  reports that he has never smoked. He has quit using smokeless tobacco. He reports that he does not drink alcohol or use drugs.   Family History:   Family history is unknown by patient.    Review of Systems: Review of Systems  Constitutional: Negative.   Respiratory: Negative.   Cardiovascular: Negative.   Gastrointestinal: Negative.   Musculoskeletal: Positive for joint pain.       Risk of falls, walks with a walker Leg weakness  Neurological: Negative.        Balance disorder  Psychiatric/Behavioral: Negative.   All other systems reviewed and are negative.    PHYSICAL EXAM: VS:  BP (!) 100/54 (BP Location: Left Arm, Patient Position: Sitting, Cuff Size: Normal)   Pulse 83   Ht 5\' 11"  (1.803 m)   Wt 188 lb (85.3 kg)   BMI 26.22 kg/m  , BMI Body mass index is 26.22 kg/m. GEN: Well nourished, well developed, in no acute distress  HEENT: normal  Neck: no JVD, carotid bruits, or masses Cardiac: RRR; no murmurs, rubs, or gallops, trace pitting edema right lower extremity to the mid shin Respiratory:   clear to auscultation bilaterally, normal work of breathing GI: soft, nontender, nondistended, + BS MS: no deformity or atrophy . Knee brace in place on the right Skin: warm and dry, no rash Neuro:  Strength and sensation are intact Psych: euthymic mood, full affect    Recent Labs: No results found for requested labs within last 8760 hours.    Lipid Panel No results found for: CHOL, HDL, LDLCALC, TRIG    Wt Readings from Last 3 Encounters:  07/05/16 188 lb (85.3 kg)  01/06/16 213 lb (96.6 kg)  11/03/15 211 lb 12.8 oz (96.1 kg)       ASSESSMENT AND PLAN:  Mixed hyperlipidemia - Plan: EKG 12-Lead Cholesterol is at goal on the current lipid regimen. No changes to the medications were made.  Coronary artery disease involving native coronary artery of native heart without angina pectoris - Plan: EKG 12-Lead Currently with no symptoms of angina. No further workup at this time. Continue current medication regimen.  Bilateral carotid artery stenosis -  Plan: EKG 12-Lead We'll continue aggressive lipid management, No bruits on exam Less than 39% stenosis bilaterally   Paroxysmal atrial fibrillation (HCC) - Plan: EKG 12-Lead Maintaining normal sinus rhythm, continue anticoagulation and low-dose beta blocker  Typical atrial flutter (HCC) - Plan: EKG 12-Lead Feels well since his cardioversion, no recurrence of his arrhythmia  Chronic systolic CHF (congestive heart failure) (HCC) - Plan: EKG 12-Lead Appears relatively euvolemic, currently not on Lasix  unable to advance his cardiac regimen given low blood pressure  Controlled type 2 diabetes mellitus with complication, without long-term current use of insulin (HCC) - Plan: EKG 12-Lead Recommended low carbohydrate diet, hemoglobin A1c 6.4  Chronic right knee pain/balance disorder Recommended he start using rubber bands, ankle weights to strengthen his legs   Total encounter time more than 25 minutes  Greater than 50% was spent  in counseling and coordination of care with the patient  Disposition:   F/U  6 months   Orders Placed This Encounter  Procedures  . EKG 12-Lead     Signed, Dossie Arbour, M.D., Ph.D. 07/05/2016  [facility] [place], [place] [phone]

## 2016-07-05 NOTE — Patient Instructions (Signed)

## 2016-07-13 ENCOUNTER — Ambulatory Visit (INDEPENDENT_AMBULATORY_CARE_PROVIDER_SITE_OTHER): Payer: Medicare Other | Admitting: Specialist

## 2016-07-13 ENCOUNTER — Encounter (INDEPENDENT_AMBULATORY_CARE_PROVIDER_SITE_OTHER): Payer: Self-pay | Admitting: Specialist

## 2016-07-13 ENCOUNTER — Ambulatory Visit (INDEPENDENT_AMBULATORY_CARE_PROVIDER_SITE_OTHER): Payer: Medicare Other

## 2016-07-13 VITALS — BP 132/77 | HR 76 | Ht 71.5 in | Wt 216.0 lb

## 2016-07-13 DIAGNOSIS — I6523 Occlusion and stenosis of bilateral carotid arteries: Secondary | ICD-10-CM

## 2016-07-13 DIAGNOSIS — G8929 Other chronic pain: Secondary | ICD-10-CM

## 2016-07-13 DIAGNOSIS — M4726 Other spondylosis with radiculopathy, lumbar region: Secondary | ICD-10-CM

## 2016-07-13 DIAGNOSIS — M545 Low back pain: Secondary | ICD-10-CM

## 2016-07-13 DIAGNOSIS — M539 Dorsopathy, unspecified: Secondary | ICD-10-CM | POA: Diagnosis not present

## 2016-07-13 DIAGNOSIS — M4316 Spondylolisthesis, lumbar region: Secondary | ICD-10-CM | POA: Diagnosis not present

## 2016-07-13 NOTE — Progress Notes (Signed)
Office Visit Note   Patient: Mark Dougherty           Date of Birth: [birth year]           MRN: [id number] Visit Date: 07/13/2016              Requested by: [name], MD [address]   [place], [place] [address] PCP: [name], MD   Assessment & Plan: Visit Diagnoses:  1. Chronic low back pain, unspecified back pain laterality, with sciatica presence unspecified     Plan: Avoid bending, stooping and avoid lifting weights greater than 10 lbs. Avoid prolong standing and walking. Avoid frequent bending and stooping  No lifting greater than 10 lbs. May use ice or moist heat for pain. Weight loss is of benefit. Handicap license is approved. Tylenol extra strength 650 mg up to 4 times a day, every  6-8 hours. See physical therapy for balance and coordination and flexion exercise Ask cardiologist if you may be able to consider being off eloquis for a short time for  Injection if necessary, not necessary at this time though.   Follow-Up Instructions: No Follow-up on file.   Orders:  Orders Placed This Encounter  Procedures  . XR Lumbar Spine 2-3 Views   No orders of the defined types were placed in this encounter.     Procedures: No procedures performed   Clinical Data: No additional findings.   Subjective: No chief complaint on file.   Mr. Wach is here for low back pain only.  He has had previous Lumbar decompression 08/28/2010 with Dr [name].  He was last seen by Dr. [name] for his back 01/15/2011.  He states that he only has low back pain that will go away with sitting.  He uses a walker to ambulate and stabilize self.  He states that stairs really bother his back. He states that he use to get injections for his back pain but he has not had any in a long time, and he does NOT want surgery.     Review of Systems  Constitutional: Negative.   HENT: Negative.   Eyes: Negative.   Respiratory: Negative.   Cardiovascular: Negative.   Gastrointestinal:  Negative.   Endocrine: Negative.   Genitourinary: Negative.   Musculoskeletal: Negative.   Skin: Negative.   Allergic/Immunologic: Negative.   Neurological: Negative.   Hematological: Negative.   Psychiatric/Behavioral: Negative.      Objective: Vital Signs: There were no vitals taken for this visit.  Physical Exam  Back Exam   Tenderness  The patient is experiencing tenderness in the lumbar.  Range of Motion  Extension: abnormal  Flexion: normal  Lateral Bend Right: abnormal  Lateral Bend Left: abnormal  Rotation Right: abnormal  Rotation Left: abnormal   Muscle Strength  Right Quadriceps:  5/5  Left Quadriceps:  5/5  Right Hamstrings:  5/5  Left Hamstrings:  5/5   Tests  Straight leg raise right: negative Straight leg raise left: negative  Reflexes  Patellar: 0/4 Achilles: 0/4 Babinski's sign: normal   Other  Toe Walk: abnormal Heel Walk: abnormal      Specialty Comments:  No specialty comments available.  Imaging: No results found.   PMFS History: Patient Active Problem List   Diagnosis Date Noted  . Chronic systolic CHF (congestive heart failure) (HCC)   . Ischemic cardiomyopathy   . Thyroid disease   . Typical atrial flutter (HCC)   . Atrial flutter (HCC) 05/31/2015  .  Neuropathy (HCC) 05/31/2015  . Atrial fibrillation (HCC) 01/04/2011  . Congestive dilated cardiomyopathy (HCC) 09/28/2010  . Dehydration 09/14/2010  . Diabetes mellitus type 2, controlled, with complications (HCC) 01/09/2010  . Hyperlipidemia 01/09/2010  . CAD (coronary artery disease) 01/09/2010  . Carotid stenosis 01/09/2010   Past Medical History:  Diagnosis Date  . A-fib (HCC)    a. in perioperative state-->converted to NSR on amiodarone  . Atrial flutter (HCC)    a. s/p DCCV 06/29/2015  . CAD (coronary artery disease)    a. s/p CABG in [place] in [place]  . Carotid artery stenosis    a. s/p left-sided CEA 2000  . Chronic systolic CHF (congestive heart failure) (HCC)     a. echo 2011 with EF 40%  . Diabetes mellitus   . Enlarged prostate   . History of BPH   . Hyperlipidemia   . Hypotension    mild  . Ischemic cardiomyopathy   . Left wrist fracture   . Peripheral neuropathy (HCC)   . Spinal stenosis   . Thyroid disease    a. low TSH and elevated free T4 in June 2015, amiodarone was stopped at that time    Family History  Problem Relation Age of Onset  . Family history unknown: Yes    Past Surgical History:  Procedure Laterality Date  . BACK SURGERY  1975  . CAROTID ENDARTERECTOMY     2010  . CORONARY ARTERY BYPASS GRAFT  1996   x3  . ELECTROPHYSIOLOGIC STUDY N/A 06/29/2015   Procedure: Cardioversion;  Surgeon: Antonieta Iba, MD;  Location: [facility] ORS;  Service: Cardiovascular;  Laterality: N/A;  . EYE SURGERY  2013  . REPLACEMENT TOTAL KNEE     left  . TOOTH EXTRACTION     Social History   Occupational History  . Not on file.   Social History Main Topics  . Smoking status: Never Smoker  . Smokeless tobacco: Former Neurosurgeon  . Alcohol use No  . Drug use: No  . Sexual activity: Not Currently

## 2016-07-13 NOTE — Patient Instructions (Addendum)
Avoid bending, stooping and avoid lifting weights greater than 10 lbs. Avoid prolong standing and walking. Avoid frequent bending and stooping  No lifting greater than 10 lbs. May use ice or moist heat for pain. Weight loss is of benefit. Handicap license is approved. Tylenol extra strength 650 mg up to 4 times a day, every  6-8 hours. See physical therapy for balance and coordination and flexion exercise Ask cardiologist if you may be able to consider being off eloquis for a short time for  Injection if necessary, not necessary at this time though.

## 2016-07-19 ENCOUNTER — Telehealth: Payer: Self-pay | Admitting: Cardiovascular Disease

## 2016-07-19 NOTE — Telephone Encounter (Addendum)
Spoke w/ [name].  Advised her that pt's chart states for him to take carvedilol 3.125 mg BID. We have not made any changes to pt's meds recently, the last refills were sent in 07/25/15. She will contact pt's pharmacy and see if they made a mistake. Asked her to have pharmacy call w/ any questions.

## 2016-07-19 NOTE — Telephone Encounter (Signed)
Patient calling to clarify if coreg was changed .  Pharmacy filled rx and new label says po once daily please call.

## 2016-07-20 MED ORDER — CARVEDILOL 3.125 MG PO TABS: 3.1250 mg | ORAL_TABLET | Freq: Two times a day (BID) | ORAL | 3 refills | Status: DC

## 2016-07-20 NOTE — Telephone Encounter (Signed)
Spoke with patient and let him know that I sent in new prescription with updated instructions. Instructed him to continue carvediolol 3.125 mg twice a day and he verbalized understanding with no further questions.

## 2016-07-20 NOTE — Telephone Encounter (Signed)
Spoke with Mark Dougherty in pharmacy and let her know that patient should be taking twice a day and that I sent in refills for this medication. She was appreciative for the call and let her know that I would contact patient to discuss these changes.

## 2016-07-20 NOTE — Addendum Note (Signed)
Addended by: Bryna Colander on: 07/20/2016 11:49 AM   Modules accepted: Orders

## 2016-07-20 NOTE — Telephone Encounter (Signed)
Spoke with Clydie Braun at Dr. Maree Krabbe office and she states that she has no record of speaking with Karin Golden regarding this medication. She states that their records indicate he was taking the carvedilol 3.125 mg once daily. Let her know that he has been taking twice daily. Let her know that I would speak with pharmacy to get this corrected.

## 2016-07-20 NOTE — Telephone Encounter (Signed)
Patient calling back to try and figure out how his medication was mixed up. I will call the pharmacy to verify prescription and will then let patient know what I find out because I do not see where it has been ordered by Dr. Arlana Pouch. He was appreciative for the call and had no further questions.

## 2016-07-20 NOTE — Telephone Encounter (Signed)
Spoke with Darl Pikes in pharmacy and she states that they sent refill request to [place] and it was declined. At that time they then sent the refill request to patients PCP and they sent in refills with different instructions. She states that she will send [place] the fax of refusal and let her know that I would contact Dr. Maree Krabbe office to see why his dosage was changed and then be in touch if we need to make any changes.

## 2016-07-20 NOTE — Telephone Encounter (Signed)
Pt is calling back stateing Dr. Maree Krabbe name is on his medication bottle for his Coreg, he states he contacted Dr. Maree Krabbe office and the state they did not change this medicaton. Pt requests we call Karin Golden.

## 2016-08-14 ENCOUNTER — Telehealth (INDEPENDENT_AMBULATORY_CARE_PROVIDER_SITE_OTHER): Payer: Self-pay | Admitting: Orthopaedic Surgery

## 2016-08-14 NOTE — Telephone Encounter (Signed)
Patient called advised medicare is sending a form for Dr Magnus Ivan to sign so that he can get a knee brace. The number to contact patient is [phone]

## 2016-08-15 ENCOUNTER — Encounter (INDEPENDENT_AMBULATORY_CARE_PROVIDER_SITE_OTHER): Payer: Self-pay | Admitting: Orthopaedic Surgery

## 2016-08-15 ENCOUNTER — Ambulatory Visit (INDEPENDENT_AMBULATORY_CARE_PROVIDER_SITE_OTHER): Payer: Medicare Other | Admitting: Orthopaedic Surgery

## 2016-08-15 ENCOUNTER — Ambulatory Visit (INDEPENDENT_AMBULATORY_CARE_PROVIDER_SITE_OTHER): Payer: Medicare Other

## 2016-08-15 DIAGNOSIS — I6523 Occlusion and stenosis of bilateral carotid arteries: Secondary | ICD-10-CM | POA: Diagnosis not present

## 2016-08-15 DIAGNOSIS — M25562 Pain in left knee: Secondary | ICD-10-CM | POA: Diagnosis not present

## 2016-08-15 DIAGNOSIS — G8929 Other chronic pain: Secondary | ICD-10-CM

## 2016-08-15 DIAGNOSIS — M25561 Pain in right knee: Secondary | ICD-10-CM | POA: Diagnosis not present

## 2016-08-15 DIAGNOSIS — M1712 Unilateral primary osteoarthritis, left knee: Secondary | ICD-10-CM

## 2016-08-15 DIAGNOSIS — M1711 Unilateral primary osteoarthritis, right knee: Secondary | ICD-10-CM

## 2016-08-15 NOTE — Progress Notes (Signed)
The patient is well-known to me. He is a 83 year old with bilateral knee pain and known osteoarthritis in the left knee that is tricompartmental in severe. He is also recover from a nondisplaced patella fracture. He would like to get x-rays of his right knee today to assess bothered him he has paperwork for me to fill out for her knee braces are both his knees. He says that his age and medical status he does not want to consider a knee replacement surgery. Injection without whether or not helping either.  On both knee examinations he has medial joint line tenderness and varus deformities with patellofemoral crepitation mild effusions and significant pain with range of motion. This is greatly affecting her mobility. I agree with him walking with a walker now. I do feel that he is a candidate for power mobility device given his severe fall risk and is limited mobility due to the severe arthritis in his knees. I'm happy to fill out any paperwork for him for power mobility device that would help him function better on a daily basis. I do feel that him having knee braces could certainly help. X-rays of his right knee show severe end-stage arthritis of that right knee knee can see that on his left knee as well with complete loss of medial joint space on both knees. Both knees have varus malalignment as well.  At this point follow-up as needed. I'll like injections can help him in any point at this

## 2016-08-16 ENCOUNTER — Telehealth (INDEPENDENT_AMBULATORY_CARE_PROVIDER_SITE_OTHER): Payer: Self-pay

## 2016-08-16 NOTE — Telephone Encounter (Signed)
I haven't seen yet ---will be looking for it---

## 2016-08-16 NOTE — Telephone Encounter (Signed)
Toniann Fail would like a call back wanting to know if an order for a back brace had been received for patient.  Order was faxed on 08/15/2016.  Call back # is [phone].

## 2016-08-20 ENCOUNTER — Ambulatory Visit (INDEPENDENT_AMBULATORY_CARE_PROVIDER_SITE_OTHER): Payer: Medicare Other | Admitting: Orthopaedic Surgery

## 2016-08-20 DIAGNOSIS — Z9181 History of falling: Secondary | ICD-10-CM

## 2016-08-20 NOTE — Progress Notes (Signed)
The patient is well-known to me. He has had significant falls in the past due to severe arthritis in both of his knees. He has already had a severe left distal humerus fracture due to his falls. He is here for evaluation for a power mobility device to can help him with his activities daily living, his mobility, and in regaining his quality of life. I agree that he needs this device and will perform a full exam and fill out paperwork for him to see if she will qualify for a power mobility device.

## 2016-08-22 NOTE — Telephone Encounter (Signed)
We have not rec'd anything from them

## 2016-08-29 ENCOUNTER — Telehealth (INDEPENDENT_AMBULATORY_CARE_PROVIDER_SITE_OTHER): Payer: Self-pay | Admitting: Orthopaedic Surgery

## 2016-08-29 NOTE — Telephone Encounter (Signed)
LAST 2 OV NOTES FAXED TO [facility] REGARDING THE ORDER DR Magnus Ivan SIGNED FOR KNEE BRACE

## 2016-09-03 ENCOUNTER — Telehealth (INDEPENDENT_AMBULATORY_CARE_PROVIDER_SITE_OTHER): Payer: Self-pay

## 2016-09-03 ENCOUNTER — Ambulatory Visit: Payer: Medicare Other | Admitting: Podiatry

## 2016-09-03 NOTE — Telephone Encounter (Signed)
Patient called concerning a form for a mobility scooter that was supposed to be mailed to his home.  CB# is [phone]

## 2016-09-05 ENCOUNTER — Telehealth (INDEPENDENT_AMBULATORY_CARE_PROVIDER_SITE_OTHER): Payer: Self-pay | Admitting: Orthopaedic Surgery

## 2016-09-05 NOTE — Telephone Encounter (Signed)
Patient called checking to see if the form was mailed to him for a mobility scooter. The number to contact patient is [phone]

## 2016-09-05 NOTE — Telephone Encounter (Signed)
Duplicate message. 

## 2016-09-10 ENCOUNTER — Telehealth (INDEPENDENT_AMBULATORY_CARE_PROVIDER_SITE_OTHER): Payer: Self-pay | Admitting: Orthopaedic Surgery

## 2016-09-10 ENCOUNTER — Encounter: Payer: Self-pay | Admitting: Podiatry

## 2016-09-10 ENCOUNTER — Ambulatory Visit (INDEPENDENT_AMBULATORY_CARE_PROVIDER_SITE_OTHER): Payer: Medicare Other | Admitting: Podiatry

## 2016-09-10 DIAGNOSIS — B351 Tinea unguium: Secondary | ICD-10-CM

## 2016-09-10 DIAGNOSIS — M79676 Pain in unspecified toe(s): Secondary | ICD-10-CM | POA: Diagnosis not present

## 2016-09-10 DIAGNOSIS — E118 Type 2 diabetes mellitus with unspecified complications: Secondary | ICD-10-CM

## 2016-09-10 NOTE — Progress Notes (Addendum)
Complaint:  Visit Type: Patient returns to my office for continued preventative foot care services. Complaint: Patient states" my nails have grown long and thick and become painful to walk and wear shoes" Patient has been diagnosed with DM with no foot complications. The patient presents for preventative foot care services. No changes to ROS  Podiatric Exam: Vascular: dorsalis pedis and posterior tibial pulses are palpable bilateral. Capillary return is immediate. Temperature gradient is WNL. Skin turgor WNL  Sensorium: Normal Semmes Weinstein monofilament test. Normal tactile sensation bilaterally. Nail Exam: Pt has thick disfigured discolored nails with subungual debris noted bilateral entire nail hallux through fifth toenails Ulcer Exam: There is no evidence of ulcer or pre-ulcerative changes or infection. Orthopedic Exam: Muscle tone and strength are WNL. No limitations in general ROM. No crepitus or effusions noted. Foot type and digits show no abnormalities.  Mild HAV  B/L Skin: No Porokeratosis. No infection or ulcers  Diagnosis:  Onychomycosis, , Pain in right toe, pain in left toes  Treatment & Plan Procedures and Treatment: Consent by patient was obtained for treatment procedures. The patient understood the discussion of treatment and procedures well. All questions were answered thoroughly reviewed. Debridement of mycotic and hypertrophic toenails, 1 through 5 bilateral and clearing of subungual debris. No ulceration, no infection noted.  Patient has ordered diabetic shoes elsewhere, Return Visit-Office Procedure: Patient instructed to return to the office for a follow up visit 3 months for continued evaluation and treatment.    Helane Gunther DPM

## 2016-09-10 NOTE — Telephone Encounter (Signed)
Dee from [employer] was needing the note refaxed over to their facility for the scooter. They received fax but the bottom was cut off. Fax # [phone] Phone # [phone]

## 2016-09-11 NOTE — Telephone Encounter (Signed)
Patient states everything that was need they found. So all is well.

## 2016-10-05 ENCOUNTER — Telehealth: Payer: Self-pay | Admitting: Cardiovascular Disease

## 2016-10-05 ENCOUNTER — Other Ambulatory Visit: Payer: Self-pay | Admitting: *Deleted

## 2016-10-05 MED ORDER — SIMVASTATIN 20 MG PO TABS: 20.0000 mg | ORAL_TABLET | Freq: Every day | ORAL | 3 refills | Status: DC

## 2016-10-05 NOTE — Telephone Encounter (Signed)
°*  STAT* If patient is at the pharmacy, call can be transferred to refill team.   1. Which medications need to be refilled? (please list name of each medication and dose if known)  Simvastatin   2. Which pharmacy/location (including street and city if local pharmacy) is medication to be sent to? [facility]   3. Do they need a 30 day or 90 day supply?  30 day

## 2016-10-05 NOTE — Telephone Encounter (Signed)
Simvastatin #30 sent to local pharmacy.

## 2016-11-12 ENCOUNTER — Encounter (INDEPENDENT_AMBULATORY_CARE_PROVIDER_SITE_OTHER): Payer: Self-pay | Admitting: Orthopaedic Surgery

## 2016-11-12 ENCOUNTER — Ambulatory Visit (INDEPENDENT_AMBULATORY_CARE_PROVIDER_SITE_OTHER): Payer: Medicare Other | Admitting: Orthopaedic Surgery

## 2016-11-12 DIAGNOSIS — M545 Low back pain, unspecified: Secondary | ICD-10-CM

## 2016-11-12 DIAGNOSIS — I6523 Occlusion and stenosis of bilateral carotid arteries: Secondary | ICD-10-CM

## 2016-11-12 MED ORDER — TIZANIDINE HCL 4 MG PO TABS: 4.0000 mg | ORAL_TABLET | Freq: Three times a day (TID) | ORAL | 1 refills | Status: DC | PRN

## 2016-11-12 MED ORDER — HYDROCODONE-ACETAMINOPHEN 5-325 MG PO TABS: 1.0000 | ORAL_TABLET | Freq: Four times a day (QID) | ORAL | 0 refills | Status: DC | PRN

## 2016-11-12 NOTE — Progress Notes (Signed)
Office Visit Note   Patient: Mark Dougherty           Date of Birth: 1927-03-08           MRN: [id number] Visit Date: 11/12/2016              Requested by: [name], MD [address]   [place], [place] [address] PCP: [name], MD   Assessment & Plan: Visit Diagnoses:  1. Acute left-sided low back pain without sciatica     Plan: I have some samples of topical anti-inflammatory will be safer to try meaning that he is on a blood thinner and it is not oral anti-inflammatories. Try Zanaflex as a muscle relaxant and some hydrocodone. I want him to still alternate ice and heat. We may in that having to consider physical therapy. I'll see him back in a week 2 weeks see how is doing overall.  Follow-Up Instructions: Return in about 2 weeks (around 11/26/2016).   Orders:  No orders of the defined types were placed in this encounter.  Meds ordered this encounter  Medications  . tiZANidine (ZANAFLEX) 4 MG tablet    Sig: Take 1 tablet (4 mg total) by mouth every 8 (eight) hours as needed for muscle spasms.    Dispense:  40 tablet    Refill:  1  . HYDROcodone-acetaminophen (NORCO/VICODIN) 5-325 MG tablet    Sig: Take 1 tablet by mouth every 6 (six) hours as needed for moderate pain.    Dispense:  60 tablet    Refill:  0      Procedures: No procedures performed   Clinical Data: No additional findings.   Subjective: Chief Complaint  Patient presents with  . Left Hip - Pain  The patient is well-known to me. He comes in with acute left-sided hip and back pain. He points more to his back sources pain. He gets electrical shocks that causes him to jump. Isolated just the back area. He denies any specific injuries. He is 83 years old. He's been taking some pain medication. He cannot take anti-inflammatories because he is on a blood thinner. He's not on a muscle relaxants either. He denies any change in bowel or bladder function. He says sometimes the left that leg or lifts  his arm both the left side he gets severe stabbing pain in his left side and is not consistent.  HPI  Review of Systems He denies any headache, chest pain, shortness of breath, fever, chills, nausea, vomiting.  Objective: Vital Signs: There were no vitals taken for this visit.  Physical Exam He is alert and oriented no acute distress Ortho Exam Examination shows severe pain with any attempts of just palpating around this area but I see no rash. I can put his hip through range of motion but he jumps most times when I put his hip through motion saying that is hurting his back. He is easily distractible and then doesn't have his severe pain. Then we will focus on it he gets severe pain even with minimal palpation. Specialty Comments:  No specialty comments available.  Imaging: No results found.   PMFS History: Patient Active Problem List   Diagnosis Date Noted  . Acute left-sided low back pain without sciatica 11/12/2016  . Chronic systolic CHF (congestive heart failure) (HCC)   . Ischemic cardiomyopathy   . Thyroid disease   . Typical atrial flutter (HCC)   . Atrial flutter (HCC) 05/31/2015  . Neuropathy 05/31/2015  .  Atrial fibrillation (HCC) 01/04/2011  . Congestive dilated cardiomyopathy (HCC) 09/28/2010  . Dehydration 09/14/2010  . Diabetes mellitus type 2, controlled, with complications (HCC) 01/09/2010  . Hyperlipidemia 01/09/2010  . CAD (coronary artery disease) 01/09/2010  . Carotid stenosis 01/09/2010   Past Medical History:  Diagnosis Date  . A-fib (HCC)    a. in perioperative state-->converted to NSR on amiodarone  . Atrial flutter (HCC)    a. s/p DCCV 06/29/2015  . CAD (coronary artery disease)    a. s/p CABG in 1996 in [place]  . Carotid artery stenosis    a. s/p left-sided CEA 2000  . Chronic systolic CHF (congestive heart failure) (HCC)    a. echo 2011 with EF 40%  . Diabetes mellitus   . Enlarged prostate   . History of BPH   . Hyperlipidemia   .  Hypotension    mild  . Ischemic cardiomyopathy   . Left wrist fracture   . Peripheral neuropathy   . Spinal stenosis   . Thyroid disease    a. low TSH and elevated free T4 in June 2015, amiodarone was stopped at that time    Family History  Problem Relation Age of Onset  . Family history unknown: Yes    Past Surgical History:  Procedure Laterality Date  . BACK SURGERY  1975  . CAROTID ENDARTERECTOMY     2010  . CORONARY ARTERY BYPASS GRAFT  1996   x3  . ELECTROPHYSIOLOGIC STUDY N/A 06/29/2015   Procedure: Cardioversion;  Surgeon: Antonieta Iba, MD;  Location: [facility] ORS;  Service: Cardiovascular;  Laterality: N/A;  . EYE SURGERY  2013  . REPLACEMENT TOTAL KNEE     left  . TOOTH EXTRACTION     Social History   Occupational History  . Not on file.   Social History Main Topics  . Smoking status: Never Smoker  . Smokeless tobacco: Former Neurosurgeon  . Alcohol use No  . Drug use: No  . Sexual activity: Not Currently

## 2016-11-21 ENCOUNTER — Emergency Department
Admission: EM | Admit: 2016-11-21 | Discharge: 2016-11-21 | Disposition: A | Discharge status: Home / Self Care | Payer: Medicare Other | Attending: Emergency Medicine | Admitting: Emergency Medicine

## 2016-11-21 ENCOUNTER — Emergency Department: Payer: Medicare Other

## 2016-11-21 ENCOUNTER — Encounter: Payer: Self-pay | Admitting: Emergency Medicine

## 2016-11-21 DIAGNOSIS — Z96652 Presence of left artificial knee joint: Secondary | ICD-10-CM | POA: Diagnosis not present

## 2016-11-21 DIAGNOSIS — N390 Urinary tract infection, site not specified: Secondary | ICD-10-CM | POA: Diagnosis not present

## 2016-11-21 DIAGNOSIS — I5022 Chronic systolic (congestive) heart failure: Secondary | ICD-10-CM | POA: Diagnosis not present

## 2016-11-21 DIAGNOSIS — Z951 Presence of aortocoronary bypass graft: Secondary | ICD-10-CM | POA: Insufficient documentation

## 2016-11-21 DIAGNOSIS — E079 Disorder of thyroid, unspecified: Secondary | ICD-10-CM | POA: Insufficient documentation

## 2016-11-21 DIAGNOSIS — R55 Syncope and collapse: Secondary | ICD-10-CM | POA: Diagnosis present

## 2016-11-21 DIAGNOSIS — I4892 Unspecified atrial flutter: Secondary | ICD-10-CM | POA: Insufficient documentation

## 2016-11-21 DIAGNOSIS — M6281 Muscle weakness (generalized): Secondary | ICD-10-CM | POA: Diagnosis not present

## 2016-11-21 DIAGNOSIS — I2510 Atherosclerotic heart disease of native coronary artery without angina pectoris: Secondary | ICD-10-CM | POA: Diagnosis not present

## 2016-11-21 DIAGNOSIS — Z7984 Long term (current) use of oral hypoglycemic drugs: Secondary | ICD-10-CM | POA: Diagnosis not present

## 2016-11-21 DIAGNOSIS — I4891 Unspecified atrial fibrillation: Secondary | ICD-10-CM | POA: Insufficient documentation

## 2016-11-21 DIAGNOSIS — Z79899 Other long term (current) drug therapy: Secondary | ICD-10-CM | POA: Diagnosis not present

## 2016-11-21 DIAGNOSIS — E119 Type 2 diabetes mellitus without complications: Secondary | ICD-10-CM | POA: Diagnosis not present

## 2016-11-21 DIAGNOSIS — Z7901 Long term (current) use of anticoagulants: Secondary | ICD-10-CM | POA: Insufficient documentation

## 2016-11-21 DIAGNOSIS — R531 Weakness: Secondary | ICD-10-CM

## 2016-11-21 LAB — URINALYSIS, COMPLETE (UACMP) WITH MICROSCOPIC
BACTERIA UA: NONE SEEN
Bilirubin Urine: NEGATIVE
Glucose, UA: NEGATIVE mg/dL
Hgb urine dipstick: NEGATIVE
Ketones, ur: NEGATIVE mg/dL
Nitrite: NEGATIVE
PROTEIN: NEGATIVE mg/dL
SPECIFIC GRAVITY, URINE: 1.024 (ref 1.005–1.030)
Squamous Epithelial / LPF: NONE SEEN
pH: 5 (ref 5.0–8.0)

## 2016-11-21 LAB — CBC
HEMATOCRIT: 33.8 % — AB (ref 40.0–52.0)
HEMOGLOBIN: 11.6 g/dL — AB (ref 13.0–18.0)
MCH: 31.9 pg (ref 26.0–34.0)
MCHC: 34.2 g/dL (ref 32.0–36.0)
MCV: 93.4 fL (ref 80.0–100.0)
Platelets: 214 10*3/uL (ref 150–440)
RBC: 3.62 MIL/uL — ABNORMAL LOW (ref 4.40–5.90)
RDW: 13.2 % (ref 11.5–14.5)
WBC: 7.5 10*3/uL (ref 3.8–10.6)

## 2016-11-21 LAB — BASIC METABOLIC PANEL
ANION GAP: 9 (ref 5–15)
BUN: 30 mg/dL — AB (ref 6–20)
CO2: 24 mmol/L (ref 22–32)
Calcium: 9.4 mg/dL (ref 8.9–10.3)
Chloride: 107 mmol/L (ref 101–111)
Creatinine, Ser: 1.24 mg/dL (ref 0.61–1.24)
GFR calc Af Amer: 58 mL/min — ABNORMAL LOW (ref >60)
GFR, EST NON AFRICAN AMERICAN: 50 mL/min — AB (ref >60)
Glucose, Bld: 142 mg/dL — ABNORMAL HIGH (ref 65–99)
POTASSIUM: 4.8 mmol/L (ref 3.5–5.1)
SODIUM: 140 mmol/L (ref 135–145)

## 2016-11-21 LAB — GLUCOSE, CAPILLARY: Glucose-Capillary: 136 mg/dL — ABNORMAL HIGH (ref 65–99)

## 2016-11-21 LAB — TROPONIN I: Troponin I: <0.03 ng/mL (ref <0.03)

## 2016-11-21 MED ORDER — CEPHALEXIN 500 MG PO CAPS: 500.0000 mg | ORAL_CAPSULE | Freq: Two times a day (BID) | ORAL | 0 refills | Status: DC

## 2016-11-21 MED ORDER — DEXTROSE 5 % IV SOLN
1.0000 g | Freq: Once | INTRAVENOUS | Status: AC
  Administered 2016-11-21: 1 g via INTRAVENOUS
  Filled 2016-11-21: qty 10

## 2016-11-21 NOTE — ED Provider Notes (Signed)
[facility] Emergency Department Provider Note  ____________________________________________  Time seen: Approximately 2:56 PM  I have reviewed the triage vital signs and the nursing notes.   HISTORY  Chief Complaint Loss of Consciousness    HPI Mark Dougherty is a 83 y.o. male who was in the car with his wife after they had gone out for breakfast when he seemed to be having a decreased level of alertness. He was not passing out but was not very interactive or talkative and seemed very weak. He reported blurry vision and asked to go to the eye doctor, but when they got there he is unable to get out of the car so she came to the ER. He is assisted to the treatment room, where he is not talking. He denies any pain. He reports feeling weak all over. Denies trauma. Denies recent illness. Vision is back to normal. Denies any headache or lateralizing paresthesia or weakness. Patient is on Eliquis.     Past Medical History:  Diagnosis Date  . A-fib (HCC)    a. in perioperative state-->converted to NSR on amiodarone  . Atrial flutter (HCC)    a. s/p DCCV 06/29/2015  . CAD (coronary artery disease)    a. s/p CABG in 1996 in [place]  . Carotid artery stenosis    a. s/p left-sided CEA 2000  . Chronic systolic CHF (congestive heart failure) (HCC)    a. echo 2011 with EF 40%  . Diabetes mellitus   . Enlarged prostate   . History of BPH   . Hyperlipidemia   . Hypotension    mild  . Ischemic cardiomyopathy   . Left wrist fracture   . Peripheral neuropathy   . Spinal stenosis   . Thyroid disease    a. low TSH and elevated free T4 in June 2015, amiodarone was stopped at that time     Patient Active Problem List   Diagnosis Date Noted  . Acute left-sided low back pain without sciatica 11/12/2016  . Chronic systolic CHF (congestive heart failure) (HCC)   . Ischemic cardiomyopathy   . Thyroid disease   . Typical atrial flutter (HCC)   . Atrial flutter (HCC)  05/31/2015  . Neuropathy 05/31/2015  . Atrial fibrillation (HCC) 01/04/2011  . Congestive dilated cardiomyopathy (HCC) 09/28/2010  . Dehydration 09/14/2010  . Diabetes mellitus type 2, controlled, with complications (HCC) 01/09/2010  . Hyperlipidemia 01/09/2010  . CAD (coronary artery disease) 01/09/2010  . Carotid stenosis 01/09/2010     Past Surgical History:  Procedure Laterality Date  . BACK SURGERY  1975  . CAROTID ENDARTERECTOMY     2010  . CORONARY ARTERY BYPASS GRAFT  1996   x3  . ELECTROPHYSIOLOGIC STUDY N/A 06/29/2015   Procedure: Cardioversion;  Surgeon: Antonieta Iba, MD;  Location: [facility] ORS;  Service: Cardiovascular;  Laterality: N/A;  . EYE SURGERY  2013  . REPLACEMENT TOTAL KNEE     left  . TOOTH EXTRACTION       Prior to Admission medications   Medication Sig Start Date End Date Taking? Authorizing Provider  carvedilol (COREG) 3.125 MG tablet Take 1 tablet (3.125 mg total) by mouth 2 (two) times daily. 07/20/16  Yes Antonieta Iba, MD  finasteride (PROSCAR) 5 MG tablet Take 5 mg by mouth daily.   Yes [provider]  folic acid (FOLVITE) 1 MG tablet Take 1 mg by mouth daily.     Yes [provider]  HYDROcodone-acetaminophen (NORCO/VICODIN) 5-325 MG  tablet Take 1 tablet by mouth every 6 (six) hours as needed for moderate pain. 11/12/16  Yes Kathryne Hitch, MD  metFORMIN (GLUCOPHAGE) 500 MG tablet Take 500 mg by mouth 2 (two) times daily with a meal.     Yes [provider]  simvastatin (ZOCOR) 20 MG tablet Take 1 tablet (20 mg total) by mouth at bedtime. 10/05/16  Yes Gollan, Tollie Pizza, MD  terazosin (HYTRIN) 1 MG capsule Take 1 mg by mouth at bedtime.     Yes [provider]  tiZANidine (ZANAFLEX) 4 MG tablet Take 1 tablet (4 mg total) by mouth every 8 (eight) hours as needed for muscle spasms. 11/12/16  Yes Kathryne Hitch, MD  vitamin B-12 (CYANOCOBALAMIN) 250 MCG tablet Take 250 mcg by mouth daily.     Yes  [provider]  apixaban (ELIQUIS) 5 MG TABS tablet Take 1 tablet (5 mg total) by mouth 2 (two) times daily. Patient not taking: Reported on 11/21/2016 04/03/16   Sondra Barges, PA-C  cephALEXin (KEFLEX) 500 MG capsule Take 1 capsule (500 mg total) by mouth 2 (two) times daily. 11/21/16   Sharman Cheek, MD     Allergies Patient has no known allergies.   Family History  Problem Relation Age of Onset  . Family history unknown: Yes    Social History Social History  Substance Use Topics  . Smoking status: Never Smoker  . Smokeless tobacco: Former Neurosurgeon  . Alcohol use No    Review of Systems  Constitutional:   No fever or chills.  ENT:   No sore throat. No rhinorrhea. Cardiovascular:   No chest pain or syncope. Respiratory:   No dyspnea or cough. Gastrointestinal:   Negative for abdominal pain, vomiting and diarrhea.  Musculoskeletal:   Negative for focal pain or swelling All other systems reviewed and are negative except as documented above in ROS and HPI.  ____________________________________________   PHYSICAL EXAM:  VITAL SIGNS: ED Triage Vitals  Enc Vitals Group     BP 11/21/16 1106 128/73     Pulse Rate 11/21/16 1106 67     Resp 11/21/16 1153 16     Temp 11/21/16 1106 97.6 F (36.4 C)     Temp Source 11/21/16 1106 Oral     SpO2 11/21/16 1106 97 %     Weight 11/21/16 1107 213 lb (96.6 kg)     Height 11/21/16 1107 5\' 11"  (1.803 m)     Head Circumference --      Peak Flow --      Pain Score --      Pain Loc --      Pain Edu? --      Excl. in GC? --     Vital signs reviewed, nursing assessments reviewed.   Constitutional:   Alert and oriented. Well appearing and in no distress. Eyes:   No scleral icterus.  EOMI. No nystagmus. No conjunctival pallor. PERRL. ENT   Head:   Normocephalic and atraumatic.   Nose:   No congestion/rhinnorhea.    Mouth/Throat:   MMM, no pharyngeal erythema. No peritonsillar mass.    Neck:   No meningismus.  Full ROM Hematological/Lymphatic/Immunilogical:   No cervical lymphadenopathy. Cardiovascular:   RRR. Symmetric bilateral radial and DP pulses.  No murmurs.  Respiratory:   Normal respiratory effort without tachypnea/retractions. Breath sounds are clear and equal bilaterally. No wheezes/rales/rhonchi. Gastrointestinal:   Soft and nontender. Non distended. There is no CVA tenderness.  No rebound, rigidity, or  guarding. Genitourinary:   deferred Musculoskeletal:   Normal range of motion in all extremities. No joint effusions.  No lower extremity tenderness.  No edema. Neurologic:   Normal speech and language.  Cranial nerves II through VII intact No pronator drift, normal finger to nose Motor grossly intact. Stroke scale 0 No gross focal neurologic deficits are appreciated.  Skin:    Skin is warm, dry and intact. No rash noted.  No petechiae, purpura, or bullae.  ____________________________________________    LABS (pertinent positives/negatives) (all labs ordered are listed, but only abnormal results are displayed) Labs Reviewed  BASIC METABOLIC PANEL - Abnormal; Notable for the following:       Result Value   Glucose, Bld 142 (*)    BUN 30 (*)    GFR calc non Af Amer 50 (*)    GFR calc Af Amer 58 (*)    All other components within normal limits  CBC - Abnormal; Notable for the following:    RBC 3.62 (*)    Hemoglobin 11.6 (*)    HCT 33.8 (*)    All other components within normal limits  URINALYSIS, COMPLETE (UACMP) WITH MICROSCOPIC - Abnormal; Notable for the following:    Color, Urine YELLOW (*)    APPearance CLEAR (*)    Leukocytes, UA SMALL (*)    All other components within normal limits  GLUCOSE, CAPILLARY - Abnormal; Notable for the following:    Glucose-Capillary 136 (*)    All other components within normal limits  URINE CULTURE  TROPONIN I  CBG MONITORING, ED   ____________________________________________   EKG  Interpreted by me Sinus rhythm rate of 68,  left axis, normal intervals. Normal QRS ST segments and T waves.  ____________________________________________    RADIOLOGY  Ct Head Wo Contrast  Result Date: 11/21/2016 CLINICAL DATA:  Near syncope, blurred vision EXAM: CT HEAD WITHOUT CONTRAST TECHNIQUE: Contiguous axial images were obtained from the base of the skull through the vertex without intravenous contrast. COMPARISON:  CT head 06/11/2014 FINDINGS: Brain: Mild to moderate cerebral atrophy, stable chronic changes in the white matter bilaterally. No acute infarct, hemorrhage, or mass. No fluid collection or shift of the midline structures Vascular: Atherosclerotic calcification. Negative for hyperdense vessel. Skull: Negative Sinuses/Orbits: Secretions in the left sphenoid sinus are partially calcified. Remaining sinuses clear. Bilateral cataract removal. Other: None IMPRESSION: Atrophy and chronic white matter changes.  No acute abnormality. Electronically Signed   By: Marlan Palau M.D.   On: 11/21/2016 11:47   Dg Chest Portable 1 View  Result Date: 11/21/2016 CLINICAL DATA:  Syncopal episodes. Back and hip pain. History of atrial fibrillation, coronary artery disease, CHF, previous CABG. EXAM: PORTABLE CHEST 1 VIEW COMPARISON:  PA and lateral chest x-ray of June 17, 2014 FINDINGS: There is mild chronic elevation of the right hemidiaphragm. The interstitial markings are both lungs are more conspicuous today. The heart is normal in size. The pulmonary vascularity is mildly prominent centrally. There is calcification in the wall of the aortic arch. The patient has undergone previous CABG. There is no pleural effusion. The bony thorax exhibits no acute abnormality. IMPRESSION: Mildly increased prominence of the pulmonary interstitium may reflect low-grade interstitial edema. There is no alveolar pneumonia. Previous CABG. Thoracic aortic atherosclerosis. Electronically Signed   By: David  [place] M.D.   On: 11/21/2016 11:41     ____________________________________________   PROCEDURES Procedures  ____________________________________________   INITIAL IMPRESSION / ASSESSMENT AND PLAN / ED COURSE  Pertinent labs & imaging results  that were available during my care of the patient were reviewed by me and considered in my medical decision making (see chart for details).  P/w episode of generalized weakness and bilateral blurry vision without headache or focal strokelike symptoms. Check labs and CT head.  Clinical Course as of Nov 22 1454  Wed Nov 21, 2016  1251 W/u neg. Had recent US carotid. On eliquis. Check mri brain. If neg, pt suitable for outpt follow up.   [PS]  1355 +UTI. Ceftriaxone ordered. If MRI neg, DC home, sx attributable to UTI.  [PS]    Clinical Course User Index [PS] Sharman Cheek, MD    ----------------------------------------- 3:00 PM on 11/21/2016 -----------------------------------------  Still awaiting MRI. Case signed out to oncoming physician at the end of my shift to follow-up MRI, and anticipate discharge home with PCP follow-up. Prescription for Keflex.   ____________________________________________   FINAL CLINICAL IMPRESSION(S) / ED DIAGNOSES  Final diagnoses:  Generalized weakness  Lower urinary tract infectious disease      New Prescriptions   CEPHALEXIN (KEFLEX) 500 MG CAPSULE    Take 1 capsule (500 mg total) by mouth 2 (two) times daily.     Portions of this note were generated with dragon dictation software. Dictation errors may occur despite best attempts at proofreading.    Sharman Cheek, MD 11/21/16 1501

## 2016-11-21 NOTE — ED Notes (Signed)
Sandwich tray provided per pt request.  

## 2016-11-21 NOTE — ED Provider Notes (Signed)
MRI results: IMPRESSION: 1. No acute intracranial abnormality. 2. Mild chronic small vessel ischemic disease.   Electronically Signed   By: Sebastian Ache M.D.   On: 11/21/2016 15:25   Is negative we'll discharge patient on Keflex for his UTI as planned by Dr. Scotty Court.   Arnaldo Natal, MD 11/21/16 [phone]

## 2016-11-21 NOTE — ED Notes (Signed)
Pt attempted to provided urine specimen; unable to do so at this time.  States he will call out when he needs to go.  Pt provided cup of water.  MD aware.

## 2016-11-21 NOTE — ED Notes (Signed)
Patient transported to MRI 

## 2016-11-21 NOTE — ED Provider Notes (Signed)
Please note M unable to add to the discharge instructions as computer will not let me saying that the discharge instructions are already longer than they should be.   Arnaldo Natal, MD 11/21/16 [phone]

## 2016-11-21 NOTE — ED Notes (Signed)
Up to bedside weth assistg to v oid about 50 ml. Spec to lab

## 2016-11-21 NOTE — ED Notes (Signed)
Patient transported to CT 

## 2016-11-21 NOTE — Discharge Instructions (Signed)
Your tests today showed a urinary tract infection.  The scans don't show any signs of a stroke.  Take antibiotics as prescribed and follow up with your doctor.   Results for orders placed or performed during the hospital encounter of 11/21/16  Basic metabolic panel  Result Value Ref Range   Sodium 140 135 - 145 mmol/L   Potassium 4.8 3.5 - 5.1 mmol/L   Chloride 107 101 - 111 mmol/L   CO2 24 22 - 32 mmol/L   Glucose, Bld 142 (H) 65 - 99 mg/dL   BUN 30 (H) 6 - 20 mg/dL   Creatinine, Ser 6.73 0.61 - 1.24 mg/dL   Calcium 9.4 8.9 - 41.9 mg/dL   GFR calc non Af Amer 50 (L) >60 mL/min   GFR calc Af Amer 58 (L) >60 mL/min   Anion gap 9 5 - 15  CBC  Result Value Ref Range   WBC 7.5 3.8 - 10.6 K/uL   RBC 3.62 (L) 4.40 - 5.90 MIL/uL   Hemoglobin 11.6 (L) 13.0 - 18.0 g/dL   HCT 37.9 (L) 02.4 - 09.7 %   MCV 93.4 80.0 - 100.0 fL   MCH 31.9 26.0 - 34.0 pg   MCHC 34.2 32.0 - 36.0 g/dL   RDW 35.3 29.9 - 24.2 %   Platelets 214 150 - 440 K/uL  Urinalysis, Complete w Microscopic  Result Value Ref Range   Color, Urine YELLOW (A) YELLOW   APPearance CLEAR (A) CLEAR   Specific Gravity, Urine 1.024 1.005 - 1.030   pH 5.0 5.0 - 8.0   Glucose, UA NEGATIVE NEGATIVE mg/dL   Hgb urine dipstick NEGATIVE NEGATIVE   Bilirubin Urine NEGATIVE NEGATIVE   Ketones, ur NEGATIVE NEGATIVE mg/dL   Protein, ur NEGATIVE NEGATIVE mg/dL   Nitrite NEGATIVE NEGATIVE   Leukocytes, UA SMALL (A) NEGATIVE   RBC / HPF 0-5 0 - 5 RBC/hpf   WBC, UA TOO NUMEROUS TO COUNT 0 - 5 WBC/hpf   Bacteria, UA NONE SEEN NONE SEEN   Squamous Epithelial / LPF NONE SEEN NONE SEEN   Mucous PRESENT    Hyaline Casts, UA PRESENT   Troponin I  Result Value Ref Range   Troponin I <0.03 <0.03 ng/mL  Glucose, capillary  Result Value Ref Range   Glucose-Capillary 136 (H) 65 - 99 mg/dL   Ct Head Wo Contrast  Result Date: 11/21/2016 CLINICAL DATA:  Near syncope, blurred vision EXAM: CT HEAD WITHOUT CONTRAST TECHNIQUE: Contiguous axial images  were obtained from the base of the skull through the vertex without intravenous contrast. COMPARISON:  CT head 06/11/2014 FINDINGS: Brain: Mild to moderate cerebral atrophy, stable chronic changes in the white matter bilaterally. No acute infarct, hemorrhage, or mass. No fluid collection or shift of the midline structures Vascular: Atherosclerotic calcification. Negative for hyperdense vessel. Skull: Negative Sinuses/Orbits: Secretions in the left sphenoid sinus are partially calcified. Remaining sinuses clear. Bilateral cataract removal. Other: None IMPRESSION: Atrophy and chronic white matter changes.  No acute abnormality. Electronically Signed   By: Marlan Palau M.D.   On: 11/21/2016 11:47   Dg Chest Portable 1 View  Result Date: 11/21/2016 CLINICAL DATA:  Syncopal episodes. Back and hip pain. History of atrial fibrillation, coronary artery disease, CHF, previous CABG. EXAM: PORTABLE CHEST 1 VIEW COMPARISON:  PA and lateral chest x-ray of June 17, 2014 FINDINGS: There is mild chronic elevation of the right hemidiaphragm. The interstitial markings are both lungs are more conspicuous today. The heart is normal in size.  The pulmonary vascularity is mildly prominent centrally. There is calcification in the wall of the aortic arch. The patient has undergone previous CABG. There is no pleural effusion. The bony thorax exhibits no acute abnormality. IMPRESSION: Mildly increased prominence of the pulmonary interstitium may reflect low-grade interstitial edema. There is no alveolar pneumonia. Previous CABG. Thoracic aortic atherosclerosis. Electronically Signed   By: David  [place] M.D.   On: 11/21/2016 11:41

## 2016-11-21 NOTE — ED Triage Notes (Signed)
Pt arrived via POV with wife. Wife states they were going to breakfast but kept passing out.  Pt states he is having back pain and hip pain. Pt states he felt his vision change and weakness. Pt required assistance to get into the bed and out of the car.  Pt is alert and oriented at this time.

## 2016-11-24 LAB — URINE CULTURE

## 2016-11-25 ENCOUNTER — Telehealth: Payer: Self-pay

## 2016-11-25 NOTE — Telephone Encounter (Signed)
Post ED Visit - Positive Culture Follow-up: Successful Patient Follow-Up  Culture assessed and recommendations reviewed by: []  Enzo Bi, Pharm.D. []  Celedonio Miyamoto, [address].D., BCPS AQ-ID []  Garvin Fila, Pharm.D., BCPS []  Georgina Pillion, [address].D., BCPS []  [place], [address].D., BCPS, AAHIVP []  Estella Husk, Pharm.D., BCPS, AAHIVP []  Lysle Pearl, PharmD, BCPS []  Casilda Carls, PharmD, BCPS []  Pollyann Samples, PharmD, BCPS [facility] Pharm D Positive urine culture  []  Patient discharged without antimicrobial prescription and treatment is now indicated [x]  Organism is resistant to prescribed ED discharge antimicrobial []  Patient with positive blood cultures  Changes discussed with ED provider: Demetrios Loll PA-C New antibiotic prescription Amoxicillin 500 mg BID x 14 days Called to Karin Golden [phone]  Contacted patient, date 11/25/16, time 1040   Mark Dougherty, [name] 11/25/2016, 10:38 AM

## 2016-11-25 NOTE — Progress Notes (Signed)
ED Antimicrobial Stewardship Positive Culture Follow Up   Mark Dougherty is an 83 y.o. male who presented to [facility] on 11/21/2016 with a chief complaint of decreased level of alertness and weakness Chief Complaint  Patient presents with  . Loss of Consciousness    Recent Results (from the past 720 hour(s))  Urine Culture     Status: Abnormal   Collection Time: 11/21/16  1:00 PM  Result Value Ref Range Status   Specimen Description URINE, RANDOM  Final   Special Requests NONE  Final   Culture >=100,000 COLONIES/mL ENTEROCOCCUS FAECALIS (A)  Final   Report Status 11/24/2016 FINAL  Final   Organism ID, Bacteria ENTEROCOCCUS FAECALIS (A)  Final      Susceptibility   Enterococcus faecalis - MIC*    AMPICILLIN <=2 SENSITIVE Sensitive     LEVOFLOXACIN 1 SENSITIVE Sensitive     NITROFURANTOIN <=16 SENSITIVE Sensitive     VANCOMYCIN 1 SENSITIVE Sensitive     * >=100,000 COLONIES/mL ENTEROCOCCUS FAECALIS    [x]  Treated with cephalexin, organism resistant to prescribed antimicrobial  New antibiotic prescription: Amoxicillin 500 mg twice daily x 14 days   ED Provider: Demetrios Loll, PA-C   Vinnie Level, PharmD., BCPS Clinical Pharmacist Phone [phone]

## 2016-11-26 ENCOUNTER — Ambulatory Visit (INDEPENDENT_AMBULATORY_CARE_PROVIDER_SITE_OTHER): Payer: Medicare Other | Admitting: Orthopaedic Surgery

## 2016-11-29 ENCOUNTER — Encounter: Payer: Self-pay | Admitting: Emergency Medicine

## 2016-11-29 ENCOUNTER — Observation Stay: Payer: Medicare Other

## 2016-11-29 ENCOUNTER — Observation Stay
Admission: EM | Admit: 2016-11-29 | Discharge: 2016-11-30 | Disposition: A | Discharge status: Home / Self Care | Payer: Medicare Other | Attending: Internal Medicine | Admitting: Internal Medicine

## 2016-11-29 ENCOUNTER — Emergency Department: Payer: Medicare Other

## 2016-11-29 DIAGNOSIS — Z87440 Personal history of urinary (tract) infections: Secondary | ICD-10-CM | POA: Diagnosis not present

## 2016-11-29 DIAGNOSIS — M4800 Spinal stenosis, site unspecified: Secondary | ICD-10-CM | POA: Insufficient documentation

## 2016-11-29 DIAGNOSIS — Z951 Presence of aortocoronary bypass graft: Secondary | ICD-10-CM | POA: Diagnosis not present

## 2016-11-29 DIAGNOSIS — R52 Pain, unspecified: Secondary | ICD-10-CM

## 2016-11-29 DIAGNOSIS — Z8249 Family history of ischemic heart disease and other diseases of the circulatory system: Secondary | ICD-10-CM | POA: Diagnosis not present

## 2016-11-29 DIAGNOSIS — G629 Polyneuropathy, unspecified: Secondary | ICD-10-CM | POA: Diagnosis not present

## 2016-11-29 DIAGNOSIS — Z833 Family history of diabetes mellitus: Secondary | ICD-10-CM | POA: Diagnosis not present

## 2016-11-29 DIAGNOSIS — N400 Enlarged prostate without lower urinary tract symptoms: Secondary | ICD-10-CM | POA: Diagnosis not present

## 2016-11-29 DIAGNOSIS — I6523 Occlusion and stenosis of bilateral carotid arteries: Secondary | ICD-10-CM | POA: Diagnosis not present

## 2016-11-29 DIAGNOSIS — I700 Atherosclerosis of aorta: Secondary | ICD-10-CM | POA: Insufficient documentation

## 2016-11-29 DIAGNOSIS — E785 Hyperlipidemia, unspecified: Secondary | ICD-10-CM | POA: Insufficient documentation

## 2016-11-29 DIAGNOSIS — E1142 Type 2 diabetes mellitus with diabetic polyneuropathy: Secondary | ICD-10-CM | POA: Insufficient documentation

## 2016-11-29 DIAGNOSIS — I2510 Atherosclerotic heart disease of native coronary artery without angina pectoris: Secondary | ICD-10-CM | POA: Diagnosis not present

## 2016-11-29 DIAGNOSIS — Z794 Long term (current) use of insulin: Secondary | ICD-10-CM | POA: Insufficient documentation

## 2016-11-29 DIAGNOSIS — E860 Dehydration: Secondary | ICD-10-CM | POA: Insufficient documentation

## 2016-11-29 DIAGNOSIS — R55 Syncope and collapse: Secondary | ICD-10-CM | POA: Diagnosis present

## 2016-11-29 DIAGNOSIS — I5022 Chronic systolic (congestive) heart failure: Secondary | ICD-10-CM | POA: Insufficient documentation

## 2016-11-29 DIAGNOSIS — Z87891 Personal history of nicotine dependence: Secondary | ICD-10-CM | POA: Insufficient documentation

## 2016-11-29 DIAGNOSIS — Z7901 Long term (current) use of anticoagulants: Secondary | ICD-10-CM | POA: Insufficient documentation

## 2016-11-29 DIAGNOSIS — R4182 Altered mental status, unspecified: Secondary | ICD-10-CM | POA: Diagnosis not present

## 2016-11-29 DIAGNOSIS — I110 Hypertensive heart disease with heart failure: Secondary | ICD-10-CM | POA: Diagnosis not present

## 2016-11-29 DIAGNOSIS — E079 Disorder of thyroid, unspecified: Secondary | ICD-10-CM | POA: Diagnosis not present

## 2016-11-29 DIAGNOSIS — I483 Typical atrial flutter: Secondary | ICD-10-CM | POA: Insufficient documentation

## 2016-11-29 DIAGNOSIS — I480 Paroxysmal atrial fibrillation: Secondary | ICD-10-CM | POA: Insufficient documentation

## 2016-11-29 HISTORY — DX: Benign prostatic hyperplasia without lower urinary tract symptoms: N40.0

## 2016-11-29 LAB — CBC
HEMATOCRIT: 36.2 % — AB (ref 40.0–52.0)
Hemoglobin: 12.2 g/dL — ABNORMAL LOW (ref 13.0–18.0)
MCH: 31.9 pg (ref 26.0–34.0)
MCHC: 33.5 g/dL (ref 32.0–36.0)
MCV: 95.1 fL (ref 80.0–100.0)
Platelets: 217 10*3/uL (ref 150–440)
RBC: 3.81 MIL/uL — ABNORMAL LOW (ref 4.40–5.90)
RDW: 13.4 % (ref 11.5–14.5)
WBC: 6.1 10*3/uL (ref 3.8–10.6)

## 2016-11-29 LAB — GLUCOSE, CAPILLARY
GLUCOSE-CAPILLARY: 145 mg/dL — AB (ref 65–99)
Glucose-Capillary: 157 mg/dL — ABNORMAL HIGH (ref 65–99)
Glucose-Capillary: 116 mg/dL — ABNORMAL HIGH (ref 65–99)

## 2016-11-29 LAB — URINALYSIS, COMPLETE (UACMP) WITH MICROSCOPIC
BACTERIA UA: NONE SEEN
Bilirubin Urine: NEGATIVE
Glucose, UA: NEGATIVE mg/dL
HGB URINE DIPSTICK: NEGATIVE
KETONES UR: NEGATIVE mg/dL
LEUKOCYTES UA: NEGATIVE
Nitrite: NEGATIVE
PROTEIN: NEGATIVE mg/dL
SQUAMOUS EPITHELIAL / LPF: NONE SEEN
Specific Gravity, Urine: 1.019 (ref 1.005–1.030)
pH: 5 (ref 5.0–8.0)

## 2016-11-29 LAB — BASIC METABOLIC PANEL WITH GFR
Anion gap: 6 (ref 5–15)
BUN: 32 mg/dL — ABNORMAL HIGH (ref 6–20)
CO2: 25 mmol/L (ref 22–32)
Calcium: 9 mg/dL (ref 8.9–10.3)
Chloride: 108 mmol/L (ref 101–111)
Creatinine, Ser: 1.3 mg/dL — ABNORMAL HIGH (ref 0.61–1.24)
GFR calc Af Amer: 55 mL/min — ABNORMAL LOW
GFR calc non Af Amer: 48 mL/min — ABNORMAL LOW
Glucose, Bld: 98 mg/dL (ref 65–99)
Potassium: 4.5 mmol/L (ref 3.5–5.1)
Sodium: 139 mmol/L (ref 135–145)

## 2016-11-29 LAB — TROPONIN I: Troponin I: <0.03 ng/mL (ref <0.03)

## 2016-11-29 MED ORDER — FINASTERIDE 5 MG PO TABS
5.0000 mg | ORAL_TABLET | Freq: Every day | ORAL | Status: DC
  Administered 2016-11-29 – 2016-11-30 (×2): 5 mg via ORAL
  Filled 2016-11-29 (×2): qty 1

## 2016-11-29 MED ORDER — ONDANSETRON HCL 4 MG/2ML IJ SOLN: 4.0000 mg | Freq: Four times a day (QID) | INTRAMUSCULAR | Status: DC | PRN

## 2016-11-29 MED ORDER — HYDROCODONE-ACETAMINOPHEN 5-325 MG PO TABS
1.0000 | ORAL_TABLET | Freq: Four times a day (QID) | ORAL | Status: DC | PRN
  Administered 2016-11-30: 1 via ORAL
  Filled 2016-11-29: qty 1

## 2016-11-29 MED ORDER — SODIUM CHLORIDE 0.9 % IV BOLUS (SEPSIS): 1000.0000 mL | Freq: Once | INTRAVENOUS | Status: DC

## 2016-11-29 MED ORDER — SODIUM CHLORIDE 0.9% FLUSH: 3.0000 mL | Freq: Two times a day (BID) | INTRAVENOUS | Status: DC

## 2016-11-29 MED ORDER — TERAZOSIN HCL 1 MG PO CAPS
1.0000 mg | ORAL_CAPSULE | Freq: Every day | ORAL | Status: DC
  Administered 2016-11-29: 1 mg via ORAL
  Filled 2016-11-29 (×2): qty 1

## 2016-11-29 MED ORDER — FOLIC ACID 1 MG PO TABS
1.0000 mg | ORAL_TABLET | Freq: Every day | ORAL | Status: DC
  Administered 2016-11-29 – 2016-11-30 (×2): 1 mg via ORAL
  Filled 2016-11-29 (×2): qty 1

## 2016-11-29 MED ORDER — INSULIN ASPART 100 UNIT/ML ~~LOC~~ SOLN: 0.0000 [IU] | Freq: Every day | SUBCUTANEOUS | Status: DC

## 2016-11-29 MED ORDER — APIXABAN 5 MG PO TABS
5.0000 mg | ORAL_TABLET | Freq: Two times a day (BID) | ORAL | Status: DC
  Administered 2016-11-29 – 2016-11-30 (×2): 5 mg via ORAL
  Filled 2016-11-29 (×2): qty 1

## 2016-11-29 MED ORDER — CARVEDILOL 3.125 MG PO TABS
3.1250 mg | ORAL_TABLET | Freq: Two times a day (BID) | ORAL | Status: DC
  Administered 2016-11-29 – 2016-11-30 (×2): 3.125 mg via ORAL
  Filled 2016-11-29 (×2): qty 1

## 2016-11-29 MED ORDER — SODIUM CHLORIDE 0.9 % IV SOLN
INTRAVENOUS | Status: AC
  Administered 2016-11-29: 18:00:00 via INTRAVENOUS

## 2016-11-29 MED ORDER — ACETAMINOPHEN 650 MG RE SUPP: 650.0000 mg | Freq: Four times a day (QID) | RECTAL | Status: DC | PRN

## 2016-11-29 MED ORDER — ACETAMINOPHEN 325 MG PO TABS: 650.0000 mg | ORAL_TABLET | Freq: Four times a day (QID) | ORAL | Status: DC | PRN

## 2016-11-29 MED ORDER — SIMVASTATIN 20 MG PO TABS
20.0000 mg | ORAL_TABLET | Freq: Every day | ORAL | Status: DC
  Administered 2016-11-29: 20 mg via ORAL
  Filled 2016-11-29: qty 1

## 2016-11-29 MED ORDER — CYANOCOBALAMIN 500 MCG PO TABS
250.0000 ug | ORAL_TABLET | Freq: Every day | ORAL | Status: DC
  Filled 2016-11-29 (×2): qty 1

## 2016-11-29 MED ORDER — DOCUSATE SODIUM 100 MG PO CAPS
100.0000 mg | ORAL_CAPSULE | Freq: Two times a day (BID) | ORAL | Status: DC
  Administered 2016-11-29 – 2016-11-30 (×2): 100 mg via ORAL
  Filled 2016-11-29 (×2): qty 1

## 2016-11-29 MED ORDER — INSULIN ASPART 100 UNIT/ML ~~LOC~~ SOLN
0.0000 [IU] | Freq: Three times a day (TID) | SUBCUTANEOUS | Status: DC
  Administered 2016-11-29 – 2016-11-30 (×3): 1 [IU] via SUBCUTANEOUS
  Filled 2016-11-29 (×3): qty 1

## 2016-11-29 MED ORDER — ONDANSETRON HCL 4 MG PO TABS: 4.0000 mg | ORAL_TABLET | Freq: Four times a day (QID) | ORAL | Status: DC | PRN

## 2016-11-29 NOTE — ED Triage Notes (Signed)
Pt arrived via EMS. Pt was out shopping with wife and had an episode where he states he "blacked out". Pt states his vision got blurry and he just didn't know anything that was goingt on. EMS reports 113/63, 60HR, NSR, 97% RA. Pt alert and oriented on arrival. Pt recently been treated for UTI.

## 2016-11-29 NOTE — H&P (Signed)
[facility] [place] [facility]   PATIENT NAME: Mark Dougherty    MR#:  [id number]  DATE OF BIRTH:  [birth year]  DATE OF ADMISSION:  11/29/2016  PRIMARY CARE PHYSICIAN: [name], MD   REQUESTING/REFERRING PHYSICIAN: Arnaldo Natal, MD  CHIEF COMPLAINT:   SYNCOPE HISTORY OF PRESENT ILLNESS:  Mark Dougherty  is a 83 y.o. male with a known history of Coronary artery disease, chronic atrial fibrillation, diabetes mellitus, hypertension recently started on muscle relaxants 10 days ago and experienced 2 syncopal episodes so far in the past one week. Patient was feeling different when he was on his way to store with his wife, he got pale and sweaty and blacked out for 5 minutes. Patient could not recall what happened when he passed out. Recently treated for UTI. Patient sees Dr. Mariah Milling as an outpatient for coronary artery disease and denies any chest pain, recently did not have any stress test done  PAST MEDICAL HISTORY:   Past Medical History:  Diagnosis Date  . A-fib (HCC)    a. in perioperative state-->converted to NSR on amiodarone  . Atrial flutter (HCC)    a. s/p DCCV 06/29/2015  . CAD (coronary artery disease)    a. s/p CABG in 1996 in [place]  . Carotid artery stenosis    a. s/p left-sided CEA 2000  . Chronic systolic CHF (congestive heart failure) (HCC)    a. echo 2011 with EF 40%  . Diabetes mellitus   . Enlarged prostate   . History of BPH   . Hyperlipidemia   . Hypotension    mild  . Ischemic cardiomyopathy   . Left wrist fracture   . Peripheral neuropathy   . Spinal stenosis   . Thyroid disease    a. low TSH and elevated free T4 in June 2015, amiodarone was stopped at that time    PAST SURGICAL HISTOIRY:   Past Surgical History:  Procedure Laterality Date  . BACK SURGERY  1975  . CAROTID ENDARTERECTOMY     2010  . CORONARY ARTERY BYPASS GRAFT  1996   x3  . ELECTROPHYSIOLOGIC STUDY N/A 06/29/2015   Procedure: Cardioversion;  Surgeon:  Antonieta Iba, MD;  Location: [facility] ORS;  Service: Cardiovascular;  Laterality: N/A;  . EYE SURGERY  2013  . REPLACEMENT TOTAL KNEE     left  . TOOTH EXTRACTION      SOCIAL HISTORY:   Social History  Substance Use Topics  . Smoking status: Never Smoker  . Smokeless tobacco: Former Neurosurgeon  . Alcohol use No    FAMILY HISTORY:   Family History  Problem Relation Age of Onset  . Family history unknown: Yes  DM, CAD runs in his family  DRUG ALLERGIES:  No Known Allergies  REVIEW OF SYSTEMS:  CONSTITUTIONAL: No fever, fatigue or weakness.  EYES: No blurred or double vision.  EARS, NOSE, AND THROAT: No tinnitus or ear pain.  RESPIRATORY: No cough, shortness of breath, wheezing or hemoptysis.  CARDIOVASCULAR: No chest pain, orthopnea, edema.  GASTROINTESTINAL: No nausea, vomiting, diarrhea or abdominal pain.  GENITOURINARY: No dysuria, hematuria.  ENDOCRINE: No polyuria, nocturia,  HEMATOLOGY: No anemia, easy bruising or bleeding SKIN: No rash or lesion. MUSCULOSKELETAL: No joint pain or arthritis.   NEUROLOGIC: No tingling, numbness, weakness.  PSYCHIATRY: No anxiety or depression.   MEDICATIONS AT HOME:   Prior to Admission medications   Medication Sig Start Date End Date Taking? Authorizing Provider  carvedilol (COREG) 3.125  MG tablet Take 1 tablet (3.125 mg total) by mouth 2 (two) times daily. 07/20/16  Yes Gollan, Tollie Pizza, MD  cephALEXin (KEFLEX) 500 MG capsule Take 1 capsule (500 mg total) by mouth 2 (two) times daily. 11/21/16  Yes Sharman Cheek, MD  finasteride (PROSCAR) 5 MG tablet Take 5 mg by mouth daily.   Yes [provider]  folic acid (FOLVITE) 1 MG tablet Take 1 mg by mouth daily.     Yes [provider]  HYDROcodone-acetaminophen (NORCO/VICODIN) 5-325 MG tablet Take 1 tablet by mouth every 6 (six) hours as needed for moderate pain. 11/12/16  Yes Kathryne Hitch, MD  metFORMIN (GLUCOPHAGE) 500 MG tablet Take 500 mg by mouth 2 (two)  times daily with a meal.     Yes [provider]  simvastatin (ZOCOR) 20 MG tablet Take 1 tablet (20 mg total) by mouth at bedtime. 10/05/16  Yes Gollan, Tollie Pizza, MD  terazosin (HYTRIN) 1 MG capsule Take 1 mg by mouth at bedtime.     Yes [provider]  tiZANidine (ZANAFLEX) 4 MG tablet Take 1 tablet (4 mg total) by mouth every 8 (eight) hours as needed for muscle spasms. 11/12/16  Yes Kathryne Hitch, MD  vitamin B-12 (CYANOCOBALAMIN) 250 MCG tablet Take 250 mcg by mouth daily.     Yes [provider]  apixaban (ELIQUIS) 5 MG TABS tablet Take 1 tablet (5 mg total) by mouth 2 (two) times daily. Patient not taking: Reported on 11/21/2016 04/03/16   Sondra Barges, PA-C      VITAL SIGNS:  Blood pressure (!) 99/53, pulse 66, temperature 98.4 F (36.9 C), resp. rate 18, height 5\' 11"  (1.803 m), weight 96.6 kg (213 lb), SpO2 98 %.  PHYSICAL EXAMINATION:  GENERAL:  82 y.o.-year-old patient lying in the bed with no acute distress.  EYES: Pupils equal, round, reactive to light and accommodation. No scleral icterus. Extraocular muscles intact.  HEENT: Head atraumatic, normocephalic. Oropharynx and nasopharynx clear.  NECK:  Supple, no jugular venous distention. No thyroid enlargement, no tenderness.  LUNGS: Normal breath sounds bilaterally, no wheezing, rales,rhonchi or crepitation. No use of accessory muscles of respiration.  CARDIOVASCULAR: S1, S2 normal. No murmurs, rubs, or gallops.  ABDOMEN: Soft, nontender, nondistended. Bowel sounds present. No organomegaly or mass.  EXTREMITIES: No pedal edema, cyanosis, or clubbing.  NEUROLOGIC: Cranial nerves II through XII are intact. Muscle strength 5/5 in all extremities. Sensation intact. Gait not checked.  PSYCHIATRIC: The patient is alert and oriented x 3.  SKIN: No obvious rash, lesion, or ulcer.   LABORATORY PANEL:   CBC  Recent Labs Lab 11/29/16 1140  WBC 6.1  HGB 12.2*  HCT 36.2*  PLT 217    ------------------------------------------------------------------------------------------------------------------  Chemistries   Recent Labs Lab 11/29/16 1345  NA 139  K 4.5  CL 108  CO2 25  GLUCOSE 98  BUN 32*  CREATININE 1.30*  CALCIUM 9.0   ------------------------------------------------------------------------------------------------------------------  Cardiac Enzymes  Recent Labs Lab 11/29/16 1345  TROPONINI <0.03   ------------------------------------------------------------------------------------------------------------------  RADIOLOGY:  Dg Chest Portable 1 View  Result Date: 11/29/2016 CLINICAL DATA:  Syncope, blurred vision. History CAD, diabetes, hypertension and atrial fibrillation. EXAM: PORTABLE CHEST 1 VIEW COMPARISON:  Chest x-ray dated 11/21/2016. FINDINGS: Heart size and mediastinal contours are stable. Atherosclerotic changes noted at the aortic arch. Chronic interstitial prominence is stable. No pleural effusion or pneumothorax seen. No acute or suspicious osseous finding. Elevation of the right hemidiaphragm is stable. IMPRESSION: No active disease.  No evidence of pneumonia or pulmonary edema. Aortic atherosclerosis. Electronically Signed   By: Bary Richard M.D.   On: 11/29/2016 13:28    EKG:   Orders placed or performed during the hospital encounter of 11/29/16  . ED EKG  . ED EKG  . EKG 12-Lead  . EKG 12-Lead    IMPRESSION AND PLAN:   Jettson Crable  is a 83 y.o. male with a known history of Coronary artery disease, chronic atrial fibrillation, diabetes mellitus, hypertension recently started on muscle relaxants 10 days ago and experienced 2 syncopal episodes so far in the past one week. Patient was feeling different when he was on his way to store with his wife, he got pale and sweaty and blacked out for 5 minutes.  # Syncope with aura Admit to telemetry Get EEG to rule out seizures CT head, carotid Dopplers and 2-D  echocardiogram Cycle troponins Check orthostatics cardiology consult is placed to Dr. Mariah Milling Will consider neurology consult if needed PT consult Discontinue muscle relaxant tizanidine, as patient is reporting that he passed out 2 times in the past one week after taking muscle relaxant  #Diabetes mellitus  check hemoglobin A1c Hold metformin Provide sliding scale insulin  #History of coronary artery disease Patient is currently symptomatic and initial troponin is negative   cycle cardiac biomarkers and monitor patient on telemetry  #Hypertension continue home medication Coreg and terazosin  #Hyperlipidemia continue Zocor  #Chronic history of atrial fibrillation Continue ELIQUIS and Coreg      All the records are reviewed and case discussed with ED provider. Management plans discussed with the patient, family and they are in agreement.  CODE STATUS: fc, wife HCPOA  TOTAL TIME TAKING CARE OF THIS PATIENT: 43 minutes.   Note: This dictation was prepared with Dragon dictation along with smaller phrase technology. Any transcriptional errors that result from this process are unintentional.  Ramonita Lab M.D on 11/29/2016 at 3:09 PM  Between 7am to 6pm - Pager - [phone]  After 6pm go to [email or web address] - password [email or web address] [facility]  [place] [facility]  Office  [phone]  CC: Primary care physician; [name], MD

## 2016-11-29 NOTE — Progress Notes (Signed)
Family Meeting Note  Advance Directive:yes  Today a meeting took place with the Patient, spouse    The following clinical team members were present during this meeting:MD  The following were discussed:Patient's diagnosis: , Patient's progosis: Unable to determine and Goals for treatment: Full Code, wife HCPOA  Additional follow-up to be provided: hospitalist, cardiology  Time spent during discussion:16 MIN  Ramonita Lab, MD

## 2016-11-29 NOTE — ED Provider Notes (Signed)
[facility] Emergency Department Provider Note   ____________________________________________   First MD Initiated Contact with Patient 11/29/16 1145     (approximate)  I have reviewed the triage vital signs and the nursing notes.   HISTORY  Chief Complaint Loss of Consciousness    HPI Mark Dougherty is a 83 y.o. male patient reports he was at the store with his wife. He got pale and sweaty and blacked out became unresponsive. Wife was with him and reports this lasted about 20 minutes. Patient didn't know what was going on. Patient was recently treated for UTI. Patient had a similar episode last week. Patient is back to normal now. Patient reports he has a wall hanging looks like a big floured home and occasionally he will stare at this wall hanging and will start to move all of a sudden and then stop after a few minutes. He is not sure what that could be.   Past Medical History:  Diagnosis Date  . A-fib (HCC)    a. in perioperative state-->converted to NSR on amiodarone  . Atrial flutter (HCC)    a. s/p DCCV 06/29/2015  . CAD (coronary artery disease)    a. s/p CABG in 1996 in [place]  . Carotid artery stenosis    a. s/p left-sided CEA 2000  . Chronic systolic CHF (congestive heart failure) (HCC)    a. echo 2011 with EF 40%  . Diabetes mellitus   . Enlarged prostate   . History of BPH   . Hyperlipidemia   . Hypotension    mild  . Ischemic cardiomyopathy   . Left wrist fracture   . Peripheral neuropathy   . Spinal stenosis   . Thyroid disease    a. low TSH and elevated free T4 in June 2015, amiodarone was stopped at that time    Patient Active Problem List   Diagnosis Date Noted  . Acute left-sided low back pain without sciatica 11/12/2016  . Chronic systolic CHF (congestive heart failure) (HCC)   . Ischemic cardiomyopathy   . Thyroid disease   . Typical atrial flutter (HCC)   . Atrial flutter (HCC) 05/31/2015  . Neuropathy 05/31/2015  .  Atrial fibrillation (HCC) 01/04/2011  . Congestive dilated cardiomyopathy (HCC) 09/28/2010  . Dehydration 09/14/2010  . Diabetes mellitus type 2, controlled, with complications (HCC) 01/09/2010  . Hyperlipidemia 01/09/2010  . CAD (coronary artery disease) 01/09/2010  . Carotid stenosis 01/09/2010    Past Surgical History:  Procedure Laterality Date  . BACK SURGERY  1975  . CAROTID ENDARTERECTOMY     2010  . CORONARY ARTERY BYPASS GRAFT  1996   x3  . ELECTROPHYSIOLOGIC STUDY N/A 06/29/2015   Procedure: Cardioversion;  Surgeon: Antonieta Iba, MD;  Location: [facility] ORS;  Service: Cardiovascular;  Laterality: N/A;  . EYE SURGERY  2013  . REPLACEMENT TOTAL KNEE     left  . TOOTH EXTRACTION      Prior to Admission medications   Medication Sig Start Date End Date Taking? Authorizing Provider  carvedilol (COREG) 3.125 MG tablet Take 1 tablet (3.125 mg total) by mouth 2 (two) times daily. 07/20/16  Yes Gollan, Tollie Pizza, MD  cephALEXin (KEFLEX) 500 MG capsule Take 1 capsule (500 mg total) by mouth 2 (two) times daily. 11/21/16  Yes Sharman Cheek, MD  finasteride (PROSCAR) 5 MG tablet Take 5 mg by mouth daily.   Yes [provider]  folic acid (FOLVITE) 1 MG tablet Take 1 mg by  mouth daily.     Yes [provider]  HYDROcodone-acetaminophen (NORCO/VICODIN) 5-325 MG tablet Take 1 tablet by mouth every 6 (six) hours as needed for moderate pain. 11/12/16  Yes Kathryne Hitch, MD  metFORMIN (GLUCOPHAGE) 500 MG tablet Take 500 mg by mouth 2 (two) times daily with a meal.     Yes [provider]  simvastatin (ZOCOR) 20 MG tablet Take 1 tablet (20 mg total) by mouth at bedtime. 10/05/16  Yes Gollan, Tollie Pizza, MD  terazosin (HYTRIN) 1 MG capsule Take 1 mg by mouth at bedtime.     Yes [provider]  tiZANidine (ZANAFLEX) 4 MG tablet Take 1 tablet (4 mg total) by mouth every 8 (eight) hours as needed for muscle spasms. 11/12/16  Yes Kathryne Hitch,  MD  vitamin B-12 (CYANOCOBALAMIN) 250 MCG tablet Take 250 mcg by mouth daily.     Yes [provider]  apixaban (ELIQUIS) 5 MG TABS tablet Take 1 tablet (5 mg total) by mouth 2 (two) times daily. Patient not taking: Reported on 11/21/2016 04/03/16   Sondra Barges, PA-C    Allergies Patient has no known allergies.  Family History  Problem Relation Age of Onset  . Family history unknown: Yes    Social History Social History  Substance Use Topics  . Smoking status: Never Smoker  . Smokeless tobacco: Former Neurosurgeon  . Alcohol use No    Review of Systems  Constitutional: No fever/chills Eyes: No visual changes. ENT: No sore throat. Cardiovascular: Denies chest pain. Respiratory: Denies shortness of breath. Gastrointestinal: No abdominal pain.  No nausea, no vomiting.  No diarrhea.  No constipation. Genitourinary: Negative for dysuria. Musculoskeletal: Negative for back pain. Skin: Negative for rash. Neurological: Negative for headaches, focal weakness or numbness.   ____________________________________________   PHYSICAL EXAM:  VITAL SIGNS: ED Triage Vitals  Enc Vitals Group     BP 11/29/16 1150 (!) 99/53     Pulse Rate 11/29/16 1150 66     Resp 11/29/16 1150 18     Temp 11/29/16 1150 98.4 F (36.9 C)     Temp src --      SpO2 11/29/16 1150 98 %     Weight 11/29/16 1146 213 lb (96.6 kg)     Height 11/29/16 1146 5\' 11"  (1.803 m)     Head Circumference --      Peak Flow --      Pain Score --      Pain Loc --      Pain Edu? --      Excl. in GC? --     Constitutional: Alert and oriented. Well appearing and in no acute distress. Eyes: Conjunctivae are normal. PERRL. EOMI. Head: Atraumatic. Nose: No congestion/rhinnorhea. Mouth/Throat: Mucous membranes are moist.  Oropharynx non-erythematous. Neck: No stridor.   Cardiovascular: Normal rate, regular rhythm. Grossly normal heart sounds.  Good peripheral circulation. Respiratory: Normal respiratory effort.  No  retractions. Lungs CTAB. Gastrointestinal: Soft and nontender. No distention. No abdominal bruits. No CVA tenderness. Musculoskeletal: No lower extremity tenderness nor edema.  No joint effusions. Neurologic:  Normal speech and language. No gross focal neurologic deficits are appreciated. Skin:  Skin is warm, dry and intact. No rash noted. Psychiatric: Mood and affect are normal. Speech and behavior are normal.  ____________________________________________   LABS (all labs ordered are listed, but only abnormal results are displayed)  Labs Reviewed  CBC - Abnormal; Notable for the following:  Result Value   RBC 3.81 (*)    Hemoglobin 12.2 (*)    HCT 36.2 (*)    All other components within normal limits  URINALYSIS, COMPLETE (UACMP) WITH MICROSCOPIC  BASIC METABOLIC PANEL  TROPONIN I   ____________________________________________  EKG   ____________________________________________  RADIOLOGY    ____________________________________________   PROCEDURES  Procedure(s) performed:  Procedures  Critical Care performed:   ____________________________________________   INITIAL IMPRESSION / ASSESSMENT AND PLAN / ED COURSE  Pertinent labs & imaging results that were available during my care of the patient were reviewed by me and considered in my medical decision making (see chart for details).   MRI has been done recently and is essentially normal     ____________________________________________   FINAL CLINICAL IMPRESSION(S) / ED DIAGNOSES  Final diagnoses:  Syncope and collapse      NEW MEDICATIONS STARTED DURING THIS VISIT:  New Prescriptions   No medications on file     Note:  This document was prepared using Dragon voice recognition software and may include unintentional dictation errors.    Arnaldo Natal, MD 11/29/16 [phone]

## 2016-11-30 ENCOUNTER — Observation Stay: Payer: Medicare Other

## 2016-11-30 ENCOUNTER — Encounter: Payer: Self-pay | Admitting: Nurse Practitioner

## 2016-11-30 ENCOUNTER — Observation Stay (HOSPITAL_BASED_OUTPATIENT_CLINIC_OR_DEPARTMENT_OTHER)
Admit: 2016-11-30 | Discharge: 2016-11-30 | Disposition: A | Discharge status: Home / Self Care | Payer: Medicare Other | Attending: Internal Medicine | Admitting: Internal Medicine

## 2016-11-30 ENCOUNTER — Other Ambulatory Visit: Payer: Self-pay | Admitting: Nurse Practitioner

## 2016-11-30 ENCOUNTER — Telehealth: Payer: Self-pay

## 2016-11-30 DIAGNOSIS — I25118 Atherosclerotic heart disease of native coronary artery with other forms of angina pectoris: Secondary | ICD-10-CM | POA: Diagnosis not present

## 2016-11-30 DIAGNOSIS — I350 Nonrheumatic aortic (valve) stenosis: Secondary | ICD-10-CM | POA: Diagnosis not present

## 2016-11-30 DIAGNOSIS — I480 Paroxysmal atrial fibrillation: Secondary | ICD-10-CM

## 2016-11-30 DIAGNOSIS — R404 Transient alteration of awareness: Secondary | ICD-10-CM | POA: Diagnosis not present

## 2016-11-30 DIAGNOSIS — R55 Syncope and collapse: Secondary | ICD-10-CM

## 2016-11-30 DIAGNOSIS — R4182 Altered mental status, unspecified: Secondary | ICD-10-CM | POA: Diagnosis not present

## 2016-11-30 DIAGNOSIS — Z951 Presence of aortocoronary bypass graft: Secondary | ICD-10-CM

## 2016-11-30 LAB — COMPREHENSIVE METABOLIC PANEL
ALBUMIN: 3.1 g/dL — AB (ref 3.5–5.0)
ALK PHOS: 63 U/L (ref 38–126)
ALT: 10 U/L — ABNORMAL LOW (ref 17–63)
ANION GAP: 5 (ref 5–15)
AST: 21 U/L (ref 15–41)
BILIRUBIN TOTAL: 0.6 mg/dL (ref 0.3–1.2)
BUN: 28 mg/dL — AB (ref 6–20)
CALCIUM: 8.7 mg/dL — AB (ref 8.9–10.3)
CO2: 25 mmol/L (ref 22–32)
Chloride: 108 mmol/L (ref 101–111)
Creatinine, Ser: 1.06 mg/dL (ref 0.61–1.24)
GFR calc Af Amer: >60 mL/min (ref >60)
GFR calc non Af Amer: >60 mL/min (ref >60)
GLUCOSE: 116 mg/dL — AB (ref 65–99)
Potassium: 4.4 mmol/L (ref 3.5–5.1)
SODIUM: 138 mmol/L (ref 135–145)
Total Protein: 6.7 g/dL (ref 6.5–8.1)

## 2016-11-30 LAB — CBC
HCT: 35.4 % — ABNORMAL LOW (ref 40.0–52.0)
HEMOGLOBIN: 11.7 g/dL — AB (ref 13.0–18.0)
MCH: 31 pg (ref 26.0–34.0)
MCHC: 33.1 g/dL (ref 32.0–36.0)
MCV: 93.8 fL (ref 80.0–100.0)
Platelets: 225 10*3/uL (ref 150–440)
RBC: 3.77 MIL/uL — ABNORMAL LOW (ref 4.40–5.90)
RDW: 13.1 % (ref 11.5–14.5)
WBC: 7.4 10*3/uL (ref 3.8–10.6)

## 2016-11-30 LAB — ECHOCARDIOGRAM COMPLETE
HEIGHTINCHES: 71 in
Weight: 3422.4 oz

## 2016-11-30 LAB — GLUCOSE, CAPILLARY: Glucose-Capillary: 125 mg/dL — ABNORMAL HIGH (ref 65–99)

## 2016-11-30 LAB — TSH: TSH: 2.918 u[IU]/mL (ref 0.350–4.500)

## 2016-11-30 LAB — TROPONIN I: Troponin I: <0.03 ng/mL (ref <0.03)

## 2016-11-30 MED ORDER — ZOLPIDEM TARTRATE 5 MG PO TABS
5.0000 mg | ORAL_TABLET | Freq: Every evening | ORAL | Status: DC | PRN
  Filled 2016-11-30: qty 1

## 2016-11-30 NOTE — Discharge Summary (Addendum)
[facility] [place] at [facility]   PATIENT NAME: Mark Dougherty    MR#:  [id number]  DATE OF BIRTH:  [birth year]  DATE OF ADMISSION:  11/29/2016 ADMITTING PHYSICIAN: Antonieta Iba, MD  DATE OF DISCHARGE: 11/30/2016   PRIMARY CARE PHYSICIAN: [name], MD    ADMISSION DIAGNOSIS:  Syncope and collapse [R55] Syncope [R55]  DISCHARGE DIAGNOSIS:  Active Problems:   Syncope   SECONDARY DIAGNOSIS:   Past Medical History:  Diagnosis Date  . A-fib (HCC)    a. in perioperative state-->converted to NSR on amiodarone  . Atrial flutter (HCC)    a. s/p DCCV 06/29/2015  . BPH (benign prostatic hyperplasia)   . CAD (coronary artery disease)    a. s/p CABG in 1996 in [place]  . Carotid artery stenosis    a. s/p left-sided CEA 2000; b. 02/2016 Carotid U/S: <39% bilat dzs.  . Chronic systolic CHF (congestive heart failure) (HCC)    a. echo 2011 with EF 40%  . Diabetes mellitus   . Hyperlipidemia   . Hypotension    mild  . Ischemic cardiomyopathy    a. 2011 Echo: Ef 40%.  . Left wrist fracture   . Peripheral neuropathy   . Spinal stenosis   . Thyroid disease    a. low TSH and elevated free T4 in June 2015, amiodarone was stopped at that time    HOSPITAL COURSE:   1. Altered mental status: Patient presented back to the [facility] emergency department on August 2 following the second episode in a week of altered mental status that occurred while speaking to his wife. I spoke with his wife this morning. On both occasions, patient was sitting and became unresponsive. She noted rolling back of the eyes with a glazed over appearance. He may have mumbled or grunted but his attempts at speech was incoherent. Symptoms resolved on both occasions within 5-10 minutes. On the first occasion, he was seen in the emergency department on July 25 with negative workup including CT and MRI. Head CT on August 2 did not show any acute intracranial process. Question sinusitis. Patient  is asymptomatic this morning and has had no events on telemetry. Troponins are normal. ECG is nonacute. Echocardiogram is pending. Continue to follow on telemetry and consider neurologic evaluation given prior history of carotid disease and transient alterations in mental status. If workup in house is unrevealing, we would plan to place a 30 day event monitor. It is unclear what role muscle relaxant therapy might be playing, as this was started about 2-1/2 weeks ago. Of note, patient has not had significant sedation following daily doses. Seen by neurologist , EEG done. Suggest no further work ups.  2. Coronary artery disease: Status post remote CABG. He has not been having any chest pain or significant dyspnea, though activity is quite limited. Troponins are normal. ECG is nonacute. Would not pursue ischemic evaluation at this time.  3. Ischemic cardiomyopathy/HFrEF: Prior EF 40% by echo in 2011. Repeat echo pending in the setting of above. No significant volume overload on exam. He is on beta blocker therapy only. He does have a history of mild hypotension which limited addition of evidence based medicines in the past.  4. Elevated blood pressure: Follow-up after a.m. Meds.  5. History of atrial fibrillation and flutter: He is in sinus rhythm. Continue beta blocker and oral anticoagulation.  6. Hyperlipidemia: Continue statin therapy.  7. Type 2 diabetes mellitus: Per internal medicine.  8. Spinal  stenosis: With chronic back and hip pain. Very limited mobility. Pain control has been adequate recently.   DISCHARGE CONDITIONS:   Stable.  CONSULTS OBTAINED:  Treatment Team:  Thana Farr, MD  DRUG ALLERGIES:  No Known Allergies  DISCHARGE MEDICATIONS:   Current Discharge Medication List    CONTINUE these medications which have NOT CHANGED   Details  carvedilol (COREG) 3.125 MG tablet Take 1 tablet (3.125 mg total) by mouth 2 (two) times daily. Qty: 180 tablet, Refills:  3    finasteride (PROSCAR) 5 MG tablet Take 5 mg by mouth daily.    folic acid (FOLVITE) 1 MG tablet Take 1 mg by mouth daily.      HYDROcodone-acetaminophen (NORCO/VICODIN) 5-325 MG tablet Take 1 tablet by mouth every 6 (six) hours as needed for moderate pain. Qty: 60 tablet, Refills: 0    metFORMIN (GLUCOPHAGE) 500 MG tablet Take 500 mg by mouth 2 (two) times daily with a meal.      simvastatin (ZOCOR) 20 MG tablet Take 1 tablet (20 mg total) by mouth at bedtime. Qty: 30 tablet, Refills: 3    terazosin (HYTRIN) 1 MG capsule Take 1 mg by mouth at bedtime.      vitamin B-12 (CYANOCOBALAMIN) 250 MCG tablet Take 250 mcg by mouth daily.      apixaban (ELIQUIS) 5 MG TABS tablet Take 1 tablet (5 mg total) by mouth 2 (two) times daily. Qty: 180 tablet, Refills: 5   Associated Diagnoses: PAF (paroxysmal atrial fibrillation) (HCC); Typical atrial flutter (HCC)      STOP taking these medications     cephALEXin (KEFLEX) 500 MG capsule      tiZANidine (ZANAFLEX) 4 MG tablet          DISCHARGE INSTRUCTIONS:    Follow with PMD in 2 weeks.  If you experience worsening of your admission symptoms, develop shortness of breath, life threatening emergency, suicidal or homicidal thoughts you must seek medical attention immediately by calling 911 or calling your MD immediately  if symptoms less severe.  You Must read complete instructions/literature along with all the possible adverse reactions/side effects for all the Medicines you take and that have been prescribed to you. Take any new Medicines after you have completely understood and accept all the possible adverse reactions/side effects.   Please note  You were cared for by a hospitalist during your hospital stay. If you have any questions about your discharge medications or the care you received while you were in the hospital after you are discharged, you can call the unit and asked to speak with the hospitalist on call if the  hospitalist that took care of you is not available. Once you are discharged, your primary care physician will handle any further medical issues. Please note that NO REFILLS for any discharge medications will be authorized once you are discharged, as it is imperative that you return to your primary care physician (or establish a relationship with a primary care physician if you do not have one) for your aftercare needs so that they can reassess your need for medications and monitor your lab values.    Today   CHIEF COMPLAINT:   Chief Complaint  Patient presents with  . Loss of Consciousness    HISTORY OF PRESENT ILLNESS:  Mark Dougherty  is a 83 y.o. male with a known history of Coronary artery disease, chronic atrial fibrillation, diabetes mellitus, hypertension recently started on muscle relaxants 10 days ago and experienced 2 syncopal episodes so far  in the past one week. Patient was feeling different when he was on his way to store with his wife, he got pale and sweaty and blacked out for 5 minutes. Patient could not recall what happened when he passed out. Recently treated for UTI. Patient sees Dr. Mariah Milling as an outpatient for coronary artery disease and denies any chest pain, recently did not have any stress test done.   VITAL SIGNS:  Blood pressure (!) 149/76, pulse 64, temperature 97.8 F (36.6 C), temperature source Oral, resp. rate 14, height 5\' 11"  (1.803 m), weight 97 kg (213 lb 14.4 oz), SpO2 97 %.  I/O:    Intake/Output Summary (Last 24 hours) at 11/30/16 1521 Last data filed at 11/30/16 1007  Gross per 24 hour  Intake           721.25 ml  Output             1875 ml  Net         -1153.75 ml    PHYSICAL EXAMINATION:  GENERAL:  83 y.o.-year-old patient lying in the bed with no acute distress.  EYES: Pupils equal, round, reactive to light and accommodation. No scleral icterus. Extraocular muscles intact.  HEENT: Head atraumatic, normocephalic. Oropharynx and nasopharynx  clear.  NECK:  Supple, no jugular venous distention. No thyroid enlargement, no tenderness.  LUNGS: Normal breath sounds bilaterally, no wheezing, rales,rhonchi or crepitation. No use of accessory muscles of respiration.  CARDIOVASCULAR: S1, S2 normal. No murmurs, rubs, or gallops.  ABDOMEN: Soft, non-tender, non-distended. Bowel sounds present. No organomegaly or mass.  EXTREMITIES: No pedal edema, cyanosis, or clubbing.  NEUROLOGIC: Cranial nerves II through XII are intact. Muscle strength 5/5 in all extremities. Sensation intact. Gait not checked.  PSYCHIATRIC: The patient is alert and oriented x 3.  SKIN: No obvious rash, lesion, or ulcer.   DATA REVIEW:   CBC  Recent Labs Lab 11/30/16 0444  WBC 7.4  HGB 11.7*  HCT 35.4*  PLT 225    Chemistries   Recent Labs Lab 11/30/16 0444  NA 138  K 4.4  CL 108  CO2 25  GLUCOSE 116*  BUN 28*  CREATININE 1.06  CALCIUM 8.7*  AST 21  ALT 10*  ALKPHOS 63  BILITOT 0.6    Cardiac Enzymes  Recent Labs Lab 11/30/16 0444  TROPONINI <0.03    Microbiology Results  Results for orders placed or performed during the hospital encounter of 11/21/16  Urine Culture     Status: Abnormal   Collection Time: 11/21/16  1:00 PM  Result Value Ref Range Status   Specimen Description URINE, RANDOM  Final   Special Requests NONE  Final   Culture >=100,000 COLONIES/mL ENTEROCOCCUS FAECALIS (A)  Final   Report Status 11/24/2016 FINAL  Final   Organism ID, Bacteria ENTEROCOCCUS FAECALIS (A)  Final      Susceptibility   Enterococcus faecalis - MIC*    AMPICILLIN <=2 SENSITIVE Sensitive     LEVOFLOXACIN 1 SENSITIVE Sensitive     NITROFURANTOIN <=16 SENSITIVE Sensitive     VANCOMYCIN 1 SENSITIVE Sensitive     * >=100,000 COLONIES/mL ENTEROCOCCUS FAECALIS    RADIOLOGY:  Ct Head Wo Contrast  Result Date: 11/29/2016 CLINICAL DATA:  Syncope EXAM: CT HEAD WITHOUT CONTRAST TECHNIQUE: Contiguous axial images were obtained from the base of the  skull through the vertex without intravenous contrast. COMPARISON:  Head CT November 21, 2016 and brain MRI November 21, 2016 ; prior head CT October 16, 2013 FINDINGS:  Brain: There is mild diffuse atrophy. There is no intracranial mass, hemorrhage, extra-axial fluid collection, or midline shift. There is patchy small vessel disease in the centra semiovale bilaterally, stable. Elsewhere gray-white compartments appear normal. No new gray-white compartment lesions evident. No acute infarct appreciable. Vascular: No hyperdense vessel evident. There is calcification in the carotid siphon regions bilaterally as well as in each distal vertebral artery. Skull: There is stable sclerosis in the posterior left parietal bone extending to the midline. No lytic or destructive bone lesions evident. No fracture. Sinuses/Orbits: There is opacification in the posterior left sphenoid sinus region, a chronic finding. Mild calcification is noted in this area. There is mucosal thickening involving multiple ethmoid air cells. Other visualized paranasal sinuses are clear. There is rightward deviation of the nasal septum. Orbits appear symmetric bilaterally. Other: Mastoids are somewhat hypoplastic but clear. There is debris in the right external auditory canal. IMPRESSION: 1. Stable atrophy with patchy periventricular small vessel disease. No intracranial mass, hemorrhage, or extra-axial fluid collections. No acute infarct evident. 2.  Multifocal arterial vascular calcification intracranially. 3. Sclerosis along the superior posterior left parietal bone extending to the midline, unchanged compared to prior studies dating back to 2015. Suspect benign etiology. 4. Areas of paranasal sinus disease, most marked in the posterior left sphenoid sinus. Mild calcification in this area raises question of underlying fungal colonization. 5.  Probable cerumen in the right external auditory canal. Electronically Signed   By: Bretta Bang III M.D.   On:  11/29/2016 16:18   US Carotid Bilateral  Result Date: 11/30/2016 CLINICAL DATA:  83 year old male with syncope EXAM: BILATERAL CAROTID DUPLEX ULTRASOUND TECHNIQUE: Wallace Cullens scale imaging, color Doppler and duplex ultrasound were performed of bilateral carotid and vertebral arteries in the neck. COMPARISON:  MRA neck 01/04/2011 FINDINGS: Criteria: Quantification of carotid stenosis is based on velocity parameters that correlate the residual internal carotid diameter with NASCET-based stenosis levels, using the diameter of the distal internal carotid lumen as the denominator for stenosis measurement. The following velocity measurements were obtained: RIGHT ICA:  81/17 cm/sec CCA:  84/12 cm/sec SYSTOLIC ICA/CCA RATIO:  1.0 DIASTOLIC ICA/CCA RATIO:  1.5 ECA:  61 cm/sec LEFT ICA:  93/29 cm/sec CCA:  74/17 cm/sec SYSTOLIC ICA/CCA RATIO:  1.3 DIASTOLIC ICA/CCA RATIO:  1.7 ECA:  80 cm/sec RIGHT CAROTID ARTERY: Heterogeneous atherosclerotic plaque in the distal common carotid artery extending into the proximal internal carotid artery. By peak systolic velocity criteria the estimated stenosis remains less than 50%, unchanged compared to the prior MRA. RIGHT VERTEBRAL ARTERY:  Patent with antegrade flow. LEFT CAROTID ARTERY: Smooth heterogeneous atherosclerotic plaque at the carotid bifurcation extending primarily into the external carotid artery. No significant atherosclerotic plaque or evidence of stenosis in the internal carotid artery. LEFT VERTEBRAL ARTERY:  Patent with normal antegrade flow. IMPRESSION: 1. Mild (1-49%) stenosis proximal right internal carotid artery secondary to heterogenous atherosclerotic plaque. 2. No significant atherosclerotic plaque or evidence of stenosis in the left internal carotid artery. 3. Vertebral arteries are patent with normal antegrade flow. Signed, Sterling Big, MD [facility] Electronically Signed   By: Malachy Moan  M.D.   On: 11/30/2016 11:49   Dg Chest Portable 1 View  Result Date: 11/29/2016 CLINICAL DATA:  Syncope, blurred vision. History CAD, diabetes, hypertension and atrial fibrillation. EXAM: PORTABLE CHEST 1 VIEW COMPARISON:  Chest x-ray dated 11/21/2016. FINDINGS: Heart size and mediastinal contours are stable. Atherosclerotic changes noted at the aortic arch. Chronic interstitial prominence is  stable. No pleural effusion or pneumothorax seen. No acute or suspicious osseous finding. Elevation of the right hemidiaphragm is stable. IMPRESSION: No active disease.  No evidence of pneumonia or pulmonary edema. Aortic atherosclerosis. Electronically Signed   By: Bary Richard M.D.   On: 11/29/2016 13:28   Dg Hip Unilat With Pelvis 2-3 Views Left  Result Date: 11/30/2016 CLINICAL DATA:  83 year old male with left hip pain to the touch with no known injury. EXAM: DG HIP (WITH OR WITHOUT PELVIS) 2-3V LEFT COMPARISON:  Prior chest in lumbar spine radiographs, but no prior pelvis or hip radiograph. FINDINGS: Nearly 4.5 cm rounded mixed lucent and sclerotic lesion along the left iliac wing has a narrow zone of transition and a benign appearance. Elsewhere bone mineralization is within normal limits for age. The pelvis appears intact.  Questionable SI joint ankylosis. Both femoral heads are normally located. There is moderate to severe degenerative acetabular spurring on the left. Moderate right acetabular spurring and sclerosis. Similar mild to moderate narrowing of the hip joint spaces. Proximal left femur is intact. No acute osseous abnormality identified. Aortic and iliofemoral calcified atherosclerosis. Negative visible bowel gas pattern. Degenerative changes in the visible lumbar spine. IMPRESSION: 1. No acute osseous abnormality identified. There is a benign appearing 4.5 cm bone lesion of the left iliac wing. Moderate bilateral hip osteoarthritis. 2. If symptoms worsen or persist Left Hip MRI would be most valuable  for further evaluation. CT would be the second line modality. 3. Calcified atherosclerosis. Electronically Signed   By: Odessa Fleming M.D.   On: 11/30/2016 10:35    EKG:   Orders placed or performed during the hospital encounter of 11/29/16  . ED EKG  . ED EKG  . EKG 12-Lead  . EKG 12-Lead      Management plans discussed with the patient, family and they are in agreement.  CODE STATUS:     Code Status Orders        Start     Ordered   11/29/16 1646  Full code  Continuous     11/29/16 1645    Code Status History    Date Active Date Inactive Code Status Order ID Comments User Context   This patient has a current code status but no historical code status.    Advance Directive Documentation     Most Recent Value  Type of Advance Directive  Healthcare Power of Attorney, Living will  Pre-existing out of facility DNR order (yellow form or pink MOST form)  -  "MOST" Form in Place?  -      TOTAL TIME TAKING CARE OF THIS PATIENT: 35 minutes.    Altamese Dilling M.D on 11/30/2016 at 3:21 PM  Between 7am to 6pm - Pager - [phone]  After 6pm go to [email or web address] - password [email or web address]  [facility]  Office  [phone]  CC: Primary care physician; [name], MD   Note: This dictation was prepared with Dragon dictation along with smaller phrase technology. Any transcriptional errors that result from this process are unintentional.

## 2016-11-30 NOTE — Plan of Care (Signed)
Problem: Safety: Goal: Ability to remain free from injury will improve Outcome: Progressing Exit alarm activated and operating. Pt is encouraged to call for assistance with activity in the room, telephone within reach.

## 2016-11-30 NOTE — Telephone Encounter (Signed)
Attempted to contact pt regarding discharge from [facility] on 11/30/16. Left message asking pt to call back regarding discharge instructions and/or medications. Advised pt of appt w/ Eula Listen, PA on 8/16 at 2:30 w/ [facility]. Asked pt to call back if unable to keep this appt.   Advised that he will receive a call from [employer] before they mail him 30 day monitor and he will need to call the 1-800 # on the box when it arrives. Asked him to call back w/ any questions.

## 2016-11-30 NOTE — Evaluation (Signed)
Physical Therapy Evaluation Patient Details Name: Mark Dougherty MRN: [id number] DOB: Jul 05, 1926 Today's Date: 11/30/2016   History of Present Illness  a 83 y.o. male with a known history of Coronary artery disease, chronic atrial fibrillation, diabetes mellitus, hypertension recently started on muscle relaxants 10 days ago and experienced 2 syncopal episodes so far in the past one week.  Recently treated for UTI.  Clinical Impression  Pt reports that he is not at his baseline, but he was safe with >100 ft of ambulation and stair negotiating with no LOBs or significant safety concerns.  Overall pt did well and should be safe to return home with assist from wife and AD.  Pt did not have excessive L hip issues with standing/walking.     Follow Up Recommendations Home health PT    Equipment Recommendations       Recommendations for Other Services       Precautions / Restrictions Precautions Precautions: Fall Restrictions Weight Bearing Restrictions: No      Mobility  Bed Mobility Overal bed mobility: Modified Independent             General bed mobility comments: light use of rails  Transfers Overall transfer level: Modified independent Equipment used: Rolling walker (2 wheeled)             General transfer comment: Pt slow with getting to standing, needed 3 attempts to stand using rocking/momentum strategy.  Walker to stabilize once up  Ambulation/Gait Ambulation/Gait assistance: Supervision Ambulation Distance (Feet): 125 Feet Assistive device: Rolling walker (2 wheeled)       General Gait Details: Pt with slow but safe and consistent ambulation.  He does not have excessive hesitation/limping/etc on the L side  Stairs Stairs: Yes Stairs assistance: Min guard Stair Management: One rail Left Number of Stairs: 4 General stair comments: b/l UEs on single rails, goes up sideways  Wheelchair Mobility    Modified Rankin (Stroke Patients Only)        Balance Overall balance assessment: Modified Independent                                           Pertinent Vitals/Pain Pain Assessment: 0-10 Pain Score: 7  Pain Location: L hip, reports more pain while in bed than during WBing    Home Living Family/patient expects to be discharged to:: Private residence Living Arrangements: Spouse/significant other Available Help at Discharge: Family   Home Access: Stairs to enter Entrance Stairs-Rails: Left Entrance Stairs-Number of Steps: 3   Home Equipment: Environmental consultant - 2 wheels;Cane - single point;Electric scooter      Prior Function Level of Independence: Independent with assistive device(s)         Comments: Pt only rarely out of the house, needs scooter for any prolonged outing     Hand Dominance        Extremity/Trunk Assessment   Upper Extremity Assessment Upper Extremity Assessment: Generalized weakness (L shld elevation to 80, R to 110, otherwise + to 4-/5 t/o)    Lower Extremity Assessment Lower Extremity Assessment: Generalized weakness (grossly 4-/5 t/o, had only minimal L hip hesitation with MMT)       Communication   Communication: No difficulties  Cognition Arousal/Alertness: Awake/alert Behavior During Therapy: WFL for tasks assessed/performed Overall Cognitive Status: Within Functional Limits for tasks assessed  General Comments      Exercises     Assessment/Plan    PT Assessment Patient needs continued PT services  PT Problem List Decreased balance;Decreased knowledge of use of DME;Decreased strength;Decreased range of motion;Decreased mobility;Decreased coordination;Decreased safety awareness;Decreased activity tolerance;Pain       PT Treatment Interventions DME instruction;Gait training;Stair training;Therapeutic activities;Functional mobility training;Therapeutic exercise;Balance training    PT Goals (Current goals can  be found in the Care Plan section)  Acute Rehab PT Goals Patient Stated Goal: go home PT Goal Formulation: With patient/family Time For Goal Achievement: 12/14/16    Frequency Min 2X/week   Barriers to discharge        Co-evaluation               AM-PAC PT "6 Clicks" Daily Activity  Outcome Measure Difficulty turning over in bed (including adjusting bedclothes, sheets and blankets)?: A Little Difficulty moving from lying on back to sitting on the side of the bed? : A Little Difficulty sitting down on and standing up from a chair with arms (e.g., wheelchair, bedside commode, etc,.)?: A Little Help needed moving to and from a bed to chair (including a wheelchair)?: None Help needed walking in hospital room?: A Little Help needed climbing 3-5 steps with a railing? : A Little 6 Click Score: 19    End of Session Equipment Utilized During Treatment: Gait belt Activity Tolerance: Patient limited by fatigue;Patient tolerated treatment well Patient left: with chair alarm set;with call bell/phone within reach   PT Visit Diagnosis: Muscle weakness (generalized) (M62.81);Difficulty in walking, not elsewhere classified (R26.2)    Time: 4497-5300 PT Time Calculation (min) (ACUTE ONLY): 19 min   Charges:   PT Evaluation $PT Eval Low Complexity: 1 Low     PT G Codes:   PT G-Codes **NOT FOR INPATIENT CLASS** Functional Assessment Tool Used: AM-PAC 6 Clicks Basic Mobility Functional Limitation: Mobility: Walking and moving around Mobility: Walking and Moving Around Current Status (F1102): At least 20 percent but less than 40 percent impaired, limited or restricted Mobility: Walking and Moving Around Goal Status [phone]): At least 1 percent but less than 20 percent impaired, limited or restricted    Malachi Pro, DPT 11/30/2016, 11:21 AM

## 2016-11-30 NOTE — Care Management Obs Status (Signed)
MEDICARE OBSERVATION STATUS NOTIFICATION   Patient Details  Name: Mark Dougherty MRN: [id number] Date of Birth: 1926/06/26   Medicare Observation Status Notification Given:  Yes Notice signed, one given to patient and the other to HIM for scanning   Eber Hong, RN 11/30/2016, 10:16 AM

## 2016-11-30 NOTE — Care Management (Addendum)
Placed in observation for syncope episode.  Patient presents from home.  Has outside scooter, cane and walker in the home. Independent in all adls, denies issues accessing medical care, obtaining medications or with transportation.  Current with PCP.  Physical therapy recommending home health physical therapy and patient in agreement.  No agency preference. [facility] to [facility] - for skilled assessment

## 2016-11-30 NOTE — Consult Note (Signed)
Cardiology Consult    Patient ID: ANTJUAN ROTHE MRN: [id number], DOB/AGE: Jan 31, 1927   Admit date: 11/29/2016 Date of Consult: 11/30/2016  Primary Physician: [name], MD Primary Cardiologist: Concha Se, MD  Requesting Provider: Ozella Rocks, MD  Patient Profile    Mark Dougherty is a 83 y.o. male with a history of Coronary artery disease status post remote coronary artery bypass grafting, postoperative atrial fibrillation, atrial flutter status post cardioversion in March 2017, carotid artery stenosis status post left carotid endarterectomy, ischemic cardiomyopathy/HFrEF with an EF 40%, BPH, hyperlipidemia, spinal stenosis with chronic pain and limited mobility, who is being seen today for the evaluation of altered mental status at the request of Dr. Elisabeth Pigeon.  Past Medical History   Past Medical History:  Diagnosis Date  . A-fib (HCC)    a. in perioperative state-->converted to NSR on amiodarone  . Atrial flutter (HCC)    a. s/p DCCV 06/29/2015  . CAD (coronary artery disease)    a. s/p CABG in 1996 in [place]  . Carotid artery stenosis    a. s/p left-sided CEA 2000  . Chronic systolic CHF (congestive heart failure) (HCC)    a. echo 2011 with EF 40%  . Diabetes mellitus   . Enlarged prostate   . History of BPH   . Hyperlipidemia   . Hypotension    mild  . Ischemic cardiomyopathy   . Left wrist fracture   . Peripheral neuropathy   . Spinal stenosis   . Thyroid disease    a. low TSH and elevated free T4 in June 2015, amiodarone was stopped at that time    Past Surgical History:  Procedure Laterality Date  . BACK SURGERY  1975  . CAROTID ENDARTERECTOMY     2010  . CORONARY ARTERY BYPASS GRAFT  1996   x3  . ELECTROPHYSIOLOGIC STUDY N/A 06/29/2015   Procedure: Cardioversion;  Surgeon: Antonieta Iba, MD;  Location: [facility] ORS;  Service: Cardiovascular;  Laterality: N/A;  . EYE SURGERY  2013  . REPLACEMENT TOTAL KNEE     left  . TOOTH EXTRACTION       Allergies  No  Known Allergies  History of Present Illness    83 year old male with the above complex past medical history including coronary artery disease status post remote coronary artery bypass grafting in 1996 in [place]. Other history includes diabetes, hyperlipidemia, ischemic cardiomyopathy with an EF of 40% by echo in 2011, carotid stenosis status post left carotid endarterectomy in 2000, postoperative atrial fibrillation, and atrial flutter status post cardioversion March 2017. He is chronically anticoagulated with eliquis therapy. He lives locally with his wife and his activity is quite limited due to back and hip pain. He does not walk and is more or less chair bound. Approximately 2-1/2 weeks ago, he was placed on Zanaflex, which she says has significantly reduced his hip pain. In general, he has tolerated this well and has not had any daytime somnolence. One week ago today, he was in the passenger seat while his wife was driving and talking to him. She noted suddenly, that he was not responding to her. I spoke with her this morning for additional details. She noted that his eyes appeared to roll back and glazed over. He may have been mumbling or grunting but was not coherent. This lasted between 5 and 10 minutes. As she was already driving, she  took him to the [facility] emergency department. Here, he complained of weakness and blurry vision.  Labs and head CT were unrevealing. He is diagnosed with a urinary tract infection and discharged home. He remained stable over the past week but then on August 2, they had just arrived at a furniture store. She helped him into a wheelchair and pushed him into the store. Once inside, he again became unresponsive and slumped in his chair. He was breathing and once again, may be grunting or mumbling. EMS was called and per wife, patient regained consciousness and 5-10 minutes. There were no sequelae. He was taken to the [facility] ED once again where head CT was nonacute. ECG,  labs/troponins are unrevealing. We have been asked to evaluate. No recurrent symptoms overnight. No events on telemetry. He is sitting up and eating breakfast this morning.  Inpatient Medications    . apixaban  5 mg Oral BID  . carvedilol  3.125 mg Oral BID  . cyanocobalamin  250 mcg Oral Daily  . docusate sodium  100 mg Oral BID  . finasteride  5 mg Oral Daily  . folic acid  1 mg Oral Daily  . insulin aspart  0-5 Units Subcutaneous QHS  . insulin aspart  0-9 Units Subcutaneous TID WC  . simvastatin  20 mg Oral QHS  . sodium chloride flush  3 mL Intravenous Q12H  . terazosin  1 mg Oral QHS    Family History    Family History  Problem Relation Age of Onset  . Family history unknown: Yes    Social History    Social History   Social History  . Marital status: Married    Spouse name: N/A  . Number of children: N/A  . Years of education: N/A   Occupational History  . Not on file.   Social History Main Topics  . Smoking status: Never Smoker  . Smokeless tobacco: Former Neurosurgeon  . Alcohol use No  . Drug use: No  . Sexual activity: Not Currently   Other Topics Concern  . Not on file   Social History Narrative  . No narrative on file     Review of Systems    General:  No chills, fever, night sweats or weight changes.  Cardiovascular:  No chest pain, dyspnea on exertion, edema, orthopnea, palpitations, paroxysmal nocturnal dyspnea. Dermatological: No rash, lesions/masses Respiratory: No cough, dyspnea Urologic: No hematuria, dysuria Abdominal:   No nausea, vomiting, diarrhea, bright red blood per rectum, melena, or hematemesis Neurologic:  +++ AMS x 2.  +++ visual changes, wkns. MSK:  Chronic back and left hip pain. All other systems reviewed and are otherwise negative except as noted above.  Physical Exam    Blood pressure (!) 156/69, pulse 65, temperature 98.2 F (36.8 C), temperature source Oral, resp. rate 17, height 5\' 11"  (1.803 m), weight 213 lb 14.4 oz (97  kg), SpO2 97 %.  General: Pleasant, NAD Psych: Normal affect. Neuro: Alert and oriented X 3. Moves all extremities spontaneously. HEENT: Normal  Neck: Supple without bruits or JVD. Lungs:  Resp regular and unlabored, CTA. Heart: RRR no s3, s4,  2/6 systolic ejection murmur heard best at the upper sternal borders. Abdomen: Soft, non-tender, non-distended, BS + x 4.  Extremities: No clubbing, cyanosis or edema. DP/PT/Radials 2+ and equal bilaterally.  Labs     Recent Labs  11/29/16 1345 11/29/16 1712 11/29/16 2238 11/30/16 0444  TROPONINI <0.03 <0.03 <0.03 <0.03   Lab Results  Component Value Date   WBC 7.4 11/30/2016   HGB 11.7 (L) 11/30/2016   HCT 35.4 (  L) 11/30/2016   MCV 93.8 11/30/2016   PLT 225 11/30/2016    Recent Labs Lab 11/30/16 0444  NA 138  K 4.4  CL 108  CO2 25  BUN 28*  CREATININE 1.06  CALCIUM 8.7*  PROT 6.7  BILITOT 0.6  ALKPHOS 63  ALT 10*  AST 21  GLUCOSE 116*    Radiology Studies    Ct Head Wo Contrast  Result Date: 11/29/2016 CLINICAL DATA:  Syncope EXAM: CT HEAD WITHOUT CONTRAST TECHNIQUE: Contiguous axial images were obtained from the base of the skull through the vertex without intravenous contrast. COMPARISON:  Head CT November 21, 2016 and brain MRI November 21, 2016 ; prior head CT October 16, 2013 FINDINGS: Brain: There is mild diffuse atrophy. There is no intracranial mass, hemorrhage, extra-axial fluid collection, or midline shift. There is patchy small vessel disease in the centra semiovale bilaterally, stable. Elsewhere gray-white compartments appear normal. No new gray-white compartment lesions evident. No acute infarct appreciable. Vascular: No hyperdense vessel evident. There is calcification in the carotid siphon regions bilaterally as well as in each distal vertebral artery. Skull: There is stable sclerosis in the posterior left parietal bone extending to the midline. No lytic or destructive bone lesions evident. No fracture. Sinuses/Orbits:  There is opacification in the posterior left sphenoid sinus region, a chronic finding. Mild calcification is noted in this area. There is mucosal thickening involving multiple ethmoid air cells. Other visualized paranasal sinuses are clear. There is rightward deviation of the nasal septum. Orbits appear symmetric bilaterally. Other: Mastoids are somewhat hypoplastic but clear. There is debris in the right external auditory canal. IMPRESSION: 1. Stable atrophy with patchy periventricular small vessel disease. No intracranial mass, hemorrhage, or extra-axial fluid collections. No acute infarct evident. 2.  Multifocal arterial vascular calcification intracranially. 3. Sclerosis along the superior posterior left parietal bone extending to the midline, unchanged compared to prior studies dating back to 2015. Suspect benign etiology. 4. Areas of paranasal sinus disease, most marked in the posterior left sphenoid sinus. Mild calcification in this area raises question of underlying fungal colonization. 5.  Probable cerumen in the right external auditory canal. Electronically Signed   By: Bretta Bang III M.D.   On: 11/29/2016 16:18   Ct Head Wo Contrast  Result Date: 11/21/2016 CLINICAL DATA:  Near syncope, blurred vision EXAM: CT HEAD WITHOUT CONTRAST TECHNIQUE: Contiguous axial images were obtained from the base of the skull through the vertex without intravenous contrast. COMPARISON:  CT head 06/11/2014 FINDINGS: Brain: Mild to moderate cerebral atrophy, stable chronic changes in the white matter bilaterally. No acute infarct, hemorrhage, or mass. No fluid collection or shift of the midline structures Vascular: Atherosclerotic calcification. Negative for hyperdense vessel. Skull: Negative Sinuses/Orbits: Secretions in the left sphenoid sinus are partially calcified. Remaining sinuses clear. Bilateral cataract removal. Other: None IMPRESSION: Atrophy and chronic white matter changes.  No acute abnormality.  Electronically Signed   By: Marlan Palau M.D.   On: 11/21/2016 11:47   Mr Brain Wo Contrast  Result Date: 11/21/2016 CLINICAL DATA:  Episode of blurry vision, generalized weakness, and near syncope earlier today. Symptoms now resolved. EXAM: MRI HEAD WITHOUT CONTRAST TECHNIQUE: Multiplanar, multiecho pulse sequences of the brain and surrounding structures were obtained without intravenous contrast. COMPARISON:  Head CT 11/21/2016 and MRI 01/04/2011 FINDINGS: There is mild motion artifact throughout. Brain: There is no evidence of acute infarct, mass, midline shift, or extra-axial fluid collection. A single focus of chronic microhemorrhage is noted in the right  centrum semiovale. There is moderate cerebral atrophy. Periventricular white matter T2 hyperintensities are stable to slightly increased from 2012 and nonspecific but compatible with chronic small vessel ischemic disease, mild for age. Vascular: Major intracranial vascular flow voids are preserved. Skull and upper cervical spine: Unremarkable bone marrow signal. Sinuses/Orbits: Bilateral cataract extraction. Minimal left sphenoid sinus mucosal thickening. Clear mastoid air cells. Other: None. IMPRESSION: 1. No acute intracranial abnormality. 2. Mild chronic small vessel ischemic disease. Electronically Signed   By: Sebastian Ache M.D.   On: 11/21/2016 15:25   Dg Chest Portable 1 View  Result Date: 11/29/2016 CLINICAL DATA:  Syncope, blurred vision. History CAD, diabetes, hypertension and atrial fibrillation. EXAM: PORTABLE CHEST 1 VIEW COMPARISON:  Chest x-ray dated 11/21/2016. FINDINGS: Heart size and mediastinal contours are stable. Atherosclerotic changes noted at the aortic arch. Chronic interstitial prominence is stable. No pleural effusion or pneumothorax seen. No acute or suspicious osseous finding. Elevation of the right hemidiaphragm is stable. IMPRESSION: No active disease.  No evidence of pneumonia or pulmonary edema. Aortic atherosclerosis.  Electronically Signed   By: Bary Richard M.D.   On: 11/29/2016 13:28   Dg Chest Portable 1 View  Result Date: 11/21/2016 CLINICAL DATA:  Syncopal episodes. Back and hip pain. History of atrial fibrillation, coronary artery disease, CHF, previous CABG. EXAM: PORTABLE CHEST 1 VIEW COMPARISON:  PA and lateral chest x-ray of June 17, 2014 FINDINGS: There is mild chronic elevation of the right hemidiaphragm. The interstitial markings are both lungs are more conspicuous today. The heart is normal in size. The pulmonary vascularity is mildly prominent centrally. There is calcification in the wall of the aortic arch. The patient has undergone previous CABG. There is no pleural effusion. The bony thorax exhibits no acute abnormality. IMPRESSION: Mildly increased prominence of the pulmonary interstitium may reflect low-grade interstitial edema. There is no alveolar pneumonia. Previous CABG. Thoracic aortic atherosclerosis. Electronically Signed   By: David  [place] M.D.   On: 11/21/2016 11:41    ECG & Cardiac Imaging    Regular sinus rhythm, 66, left axis deviation, incomplete left bundle branch block, first-degree AV block-no acute changes.  Assessment & Plan    1. Altered mental status: Patient presented back to the [facility] emergency department on August 2 following the second episode in a week of altered mental status that occurred while speaking to his wife. I spoke with his wife this morning. On both occasions, patient was sitting and became unresponsive. She noted rolling back of the eyes with a glazed over appearance. He may have mumbled or grunted but his attempts at speech was incoherent. Symptoms resolved on both occasions within 5-10 minutes. On the first occasion, he was seen in the emergency department on July 25 with negative workup including CT and MRI. Head CT on August 2 did not show any acute intracranial process. Question sinusitis. Patient is asymptomatic this morning and has had no  events on telemetry. Troponins are normal. ECG is nonacute. Echocardiogram is pending. Continue to follow on telemetry and consider neurologic evaluation given prior history of carotid disease and transient alterations in mental status. If workup in house is unrevealing, we would plan to place a 30 day event monitor. It is unclear what role muscle relaxant therapy might be playing, as this was started about 2-1/2 weeks ago. Of note, patient has not had significant sedation following daily doses.  2. Coronary artery disease: Status post remote CABG. He has not been having any chest pain or significant dyspnea, though  activity is quite limited. Troponins are normal. ECG is nonacute. Would not pursue ischemic evaluation at this time.  3. Ischemic cardiomyopathy/HFrEF: Prior EF 40% by echo in 2011. Repeat echo pending in the setting of above. No significant volume overload on exam. He is on beta blocker therapy only. He does have a history of mild hypotension which limited addition of evidence based medicines in the past.  4. Elevated blood pressure: Follow-up after a.m. Meds.  5. History of atrial fibrillation and flutter: He is in sinus rhythm. Continue beta blocker and oral anticoagulation.  6. Hyperlipidemia: Continue statin therapy.  7. Type 2 diabetes mellitus: Per internal medicine.  8. Spinal stenosis: With chronic back and hip pain. Very limited mobility. Pain control has been adequate recently.  Signed, Nicolasa Ducking, NP 11/30/2016, 9:25 AM

## 2016-11-30 NOTE — Plan of Care (Signed)
Problem: Safety: Goal: Ability to remain free from injury will improve Outcome: Progressing No syncopal episodes when up to bedside to void.  No complaints of dizziness.

## 2016-11-30 NOTE — Consult Note (Signed)
Reason for Consult:Syncope Referring Physician: Elisabeth Pigeon  CC: Syncope  HPI: Mark Dougherty is an 83 y.o. male with multiple medical problems who presented after an episode of unresponsiveness.  Patient and wife report that one week ago after going out to eat breakfast he got in the car with his wife.  She noted that he became glassy-eyed and starred off into space.  He was taken to the ED where his work up was only significant for a UTI.  Yesterday the episode happened again.  The setting was the same, after eating breakfast out while in the car afterward.    Past Medical History:  Diagnosis Date  . A-fib (HCC)    a. in perioperative state-->converted to NSR on amiodarone  . Atrial flutter (HCC)    a. s/p DCCV 06/29/2015  . BPH (benign prostatic hyperplasia)   . CAD (coronary artery disease)    a. s/p CABG in 1996 in [place]  . Carotid artery stenosis    a. s/p left-sided CEA 2000; b. 02/2016 Carotid U/S: <39% bilat dzs.  . Chronic systolic CHF (congestive heart failure) (HCC)    a. echo 2011 with EF 40%  . Diabetes mellitus   . Hyperlipidemia   . Hypotension    mild  . Ischemic cardiomyopathy    a. 2011 Echo: Ef 40%.  . Left wrist fracture   . Peripheral neuropathy   . Spinal stenosis   . Thyroid disease    a. low TSH and elevated free T4 in June 2015, amiodarone was stopped at that time    Past Surgical History:  Procedure Laterality Date  . BACK SURGERY  1975  . CAROTID ENDARTERECTOMY     2010  . CORONARY ARTERY BYPASS GRAFT  1996   x3  . ELECTROPHYSIOLOGIC STUDY N/A 06/29/2015   Procedure: Cardioversion;  Surgeon: Antonieta Iba, MD;  Location: [facility] ORS;  Service: Cardiovascular;  Laterality: N/A;  . EYE SURGERY  2013  . REPLACEMENT TOTAL KNEE     left  . TOOTH EXTRACTION      Family History  Problem Relation Age of Onset  . Family history unknown: Yes    Social History:  reports that he has never smoked. He has quit using smokeless tobacco. He reports that he does  not drink alcohol or use drugs.  No Known Allergies  Medications:  Prior to Admission medications   Medication Sig Start Date End Date Taking? Authorizing Provider  carvedilol (COREG) 3.125 MG tablet Take 1 tablet (3.125 mg total) by mouth 2 (two) times daily. 07/20/16  Yes Antonieta Iba, MD  finasteride (PROSCAR) 5 MG tablet Take 5 mg by mouth daily.   Yes [provider]  folic acid (FOLVITE) 1 MG tablet Take 1 mg by mouth daily.     Yes [provider]  HYDROcodone-acetaminophen (NORCO/VICODIN) 5-325 MG tablet Take 1 tablet by mouth every 6 (six) hours as needed for moderate pain. 11/12/16  Yes Kathryne Hitch, MD  metFORMIN (GLUCOPHAGE) 500 MG tablet Take 500 mg by mouth 2 (two) times daily with a meal.     Yes [provider]  simvastatin (ZOCOR) 20 MG tablet Take 1 tablet (20 mg total) by mouth at bedtime. 10/05/16  Yes Gollan, Tollie Pizza, MD  terazosin (HYTRIN) 1 MG capsule Take 1 mg by mouth at bedtime.     Yes [provider]  vitamin B-12 (CYANOCOBALAMIN) 250 MCG tablet Take 250 mcg by mouth daily.     Yes [provider]  apixaban (ELIQUIS) 5 MG TABS tablet Take 1 tablet (5 mg total) by mouth 2 (two) times daily. Patient not taking: Reported on 11/21/2016 04/03/16   Sondra Barges, PA-C    ROS: History obtained from the patient  General ROS: negative for - chills, fatigue, fever, night sweats, weight gain or weight loss Psychological ROS: negative for - behavioral disorder, hallucinations, memory difficulties, mood swings or suicidal ideation Ophthalmic ROS: negative for - blurry vision, double vision, eye pain or loss of vision ENT ROS: negative for - epistaxis, nasal discharge, oral lesions, sore throat, tinnitus or vertigo Allergy and Immunology ROS: negative for - hives or itchy/watery eyes Hematological and Lymphatic ROS: negative for - bleeding problems, bruising or swollen lymph nodes Endocrine ROS: negative for -  galactorrhea, hair pattern changes, polydipsia/polyuria or temperature intolerance Respiratory ROS: negative for - cough, hemoptysis, shortness of breath or wheezing Cardiovascular ROS: negative for - chest pain, dyspnea on exertion, edema or irregular heartbeat Gastrointestinal ROS: negative for - abdominal pain, diarrhea, hematemesis, nausea/vomiting or stool incontinence Genito-Urinary ROS: negative for - dysuria, hematuria, incontinence or urinary frequency/urgency Musculoskeletal ROS: negative for - joint swelling or muscular weakness Neurological ROS: as noted in HPI Dermatological ROS: negative for rash and skin lesion changes  Physical Examination: Blood pressure (!) 149/76, pulse 64, temperature 97.8 F (36.6 C), temperature source Oral, resp. rate 14, height 5\' 11"  (1.803 m), weight 97 kg (213 lb 14.4 oz), SpO2 97 %.  HEENT-  Normocephalic, no lesions, without obvious abnormality.  Normal external eye and conjunctiva.  Normal TM's bilaterally.  Normal auditory canals and external ears. Normal external nose, mucus membranes and septum.  Normal pharynx. Cardiovascular- S1, S2 normal, pulses palpable throughout   Lungs- chest clear, no wheezing, rales, normal symmetric air entry Abdomen- soft, non-tender; bowel sounds normal; no masses,  no organomegaly Extremities- RLE larger than the left Lymph-no adenopathy palpable Musculoskeletal-no joint tenderness, deformity or swelling Skin-warm and dry, no hyperpigmentation, vitiligo, or suspicious lesions  Neurological Examination   Mental Status: Alert, oriented, thought content appropriate.  Speech fluent without evidence of aphasia.  Able to follow 3 step commands without difficulty. Cranial Nerves: II: Discs flat bilaterally; Visual fields grossly normal, pupils equal, round, reactive to light and accommodation III,IV, VI: ptosis not present, extra-ocular motions intact bilaterally V,VII: smile symmetric, facial light touch sensation  normal bilaterally VIII: hearing normal bilaterally IX,X: gag reflex present XI: bilateral shoulder shrug XII: midline tongue extension Motor: Right : Upper extremity   5/5    Left:     Upper extremity   5/5  Lower extremity   5/5     Lower extremity   5/5 Tone and bulk:normal tone throughout; no atrophy noted Sensory: Pinprick and light touch intact throughout, bilaterally Deep Tendon Reflexes: 2+ in the upper extremities, 1+ at the knees and absent at the ankles Plantars: Right: mute   Left: mute Cerebellar: Normal finger-to-nose and normal heel-to-shin testing bilaterally Gait: not tested due to safety concerns    Laboratory Studies:   Basic Metabolic Panel:  Recent Labs Lab 11/29/16 1345 11/30/16 0444  NA 139 138  K 4.5 4.4  CL 108 108  CO2 25 25  GLUCOSE 98 116*  BUN 32* 28*  CREATININE 1.30* 1.06  CALCIUM 9.0 8.7*    Liver Function Tests:  Recent Labs Lab 11/30/16 0444  AST 21  ALT 10*  ALKPHOS 63  BILITOT 0.6  PROT 6.7  ALBUMIN 3.1*   No results for  input(s): LIPASE, AMYLASE in the last 168 hours. No results for input(s): AMMONIA in the last 168 hours.  CBC:  Recent Labs Lab 11/29/16 1140 11/30/16 0444  WBC 6.1 7.4  HGB 12.2* 11.7*  HCT 36.2* 35.4*  MCV 95.1 93.8  PLT 217 225    Cardiac Enzymes:  Recent Labs Lab 11/29/16 1345 11/29/16 1712 11/29/16 2238 11/30/16 0444  TROPONINI <0.03 <0.03 <0.03 <0.03    BNP: Invalid input(s): POCBNP  CBG:  Recent Labs Lab 11/29/16 1736 11/29/16 1754 11/29/16 2034 11/30/16 0746  GLUCAP 145* 157* 116* 125*    Microbiology: Results for orders placed or performed during the hospital encounter of 11/21/16  Urine Culture     Status: Abnormal   Collection Time: 11/21/16  1:00 PM  Result Value Ref Range Status   Specimen Description URINE, RANDOM  Final   Special Requests NONE  Final   Culture >=100,000 COLONIES/mL ENTEROCOCCUS FAECALIS (A)  Final   Report Status 11/24/2016 FINAL  Final    Organism ID, Bacteria ENTEROCOCCUS FAECALIS (A)  Final      Susceptibility   Enterococcus faecalis - MIC*    AMPICILLIN <=2 SENSITIVE Sensitive     LEVOFLOXACIN 1 SENSITIVE Sensitive     NITROFURANTOIN <=16 SENSITIVE Sensitive     VANCOMYCIN 1 SENSITIVE Sensitive     * >=100,000 COLONIES/mL ENTEROCOCCUS FAECALIS    Coagulation Studies: No results for input(s): LABPROT, INR in the last 72 hours.  Urinalysis:  Recent Labs Lab 11/29/16 1345  COLORURINE YELLOW*  LABSPEC 1.019  PHURINE 5.0  GLUCOSEU NEGATIVE  HGBUR NEGATIVE  BILIRUBINUR NEGATIVE  KETONESUR NEGATIVE  PROTEINUR NEGATIVE  NITRITE NEGATIVE  LEUKOCYTESUR NEGATIVE    Lipid Panel:  No results found for: CHOL, TRIG, HDL, CHOLHDL, VLDL, LDLCALC  HgbA1C:  Lab Results  Component Value Date   HGBA1C 6.4 (H) 10/17/2013    Urine Drug Screen:  No results found for: LABOPIA, COCAINSCRNUR, LABBENZ, AMPHETMU, THCU, LABBARB  Alcohol Level: No results for input(s): ETH in the last 168 hours.  Other results: EKG: sinus rhythm at 66 bpm.  Imaging: Ct Head Wo Contrast  Result Date: 11/29/2016 CLINICAL DATA:  Syncope EXAM: CT HEAD WITHOUT CONTRAST TECHNIQUE: Contiguous axial images were obtained from the base of the skull through the vertex without intravenous contrast. COMPARISON:  Head CT November 21, 2016 and brain MRI November 21, 2016 ; prior head CT October 16, 2013 FINDINGS: Brain: There is mild diffuse atrophy. There is no intracranial mass, hemorrhage, extra-axial fluid collection, or midline shift. There is patchy small vessel disease in the centra semiovale bilaterally, stable. Elsewhere gray-white compartments appear normal. No new gray-white compartment lesions evident. No acute infarct appreciable. Vascular: No hyperdense vessel evident. There is calcification in the carotid siphon regions bilaterally as well as in each distal vertebral artery. Skull: There is stable sclerosis in the posterior left parietal bone extending to  the midline. No lytic or destructive bone lesions evident. No fracture. Sinuses/Orbits: There is opacification in the posterior left sphenoid sinus region, a chronic finding. Mild calcification is noted in this area. There is mucosal thickening involving multiple ethmoid air cells. Other visualized paranasal sinuses are clear. There is rightward deviation of the nasal septum. Orbits appear symmetric bilaterally. Other: Mastoids are somewhat hypoplastic but clear. There is debris in the right external auditory canal. IMPRESSION: 1. Stable atrophy with patchy periventricular small vessel disease. No intracranial mass, hemorrhage, or extra-axial fluid collections. No acute infarct evident. 2.  Multifocal arterial vascular calcification intracranially.  3. Sclerosis along the superior posterior left parietal bone extending to the midline, unchanged compared to prior studies dating back to 2015. Suspect benign etiology. 4. Areas of paranasal sinus disease, most marked in the posterior left sphenoid sinus. Mild calcification in this area raises question of underlying fungal colonization. 5.  Probable cerumen in the right external auditory canal. Electronically Signed   By: Bretta Bang III M.D.   On: 11/29/2016 16:18   US Carotid Bilateral  Result Date: 11/30/2016 CLINICAL DATA:  83 year old male with syncope EXAM: BILATERAL CAROTID DUPLEX ULTRASOUND TECHNIQUE: Wallace Cullens scale imaging, color Doppler and duplex ultrasound were performed of bilateral carotid and vertebral arteries in the neck. COMPARISON:  MRA neck 01/04/2011 FINDINGS: Criteria: Quantification of carotid stenosis is based on velocity parameters that correlate the residual internal carotid diameter with NASCET-based stenosis levels, using the diameter of the distal internal carotid lumen as the denominator for stenosis measurement. The following velocity measurements were obtained: RIGHT ICA:  81/17 cm/sec CCA:  84/12 cm/sec SYSTOLIC ICA/CCA RATIO:  1.0  DIASTOLIC ICA/CCA RATIO:  1.5 ECA:  61 cm/sec LEFT ICA:  93/29 cm/sec CCA:  74/17 cm/sec SYSTOLIC ICA/CCA RATIO:  1.3 DIASTOLIC ICA/CCA RATIO:  1.7 ECA:  80 cm/sec RIGHT CAROTID ARTERY: Heterogeneous atherosclerotic plaque in the distal common carotid artery extending into the proximal internal carotid artery. By peak systolic velocity criteria the estimated stenosis remains less than 50%, unchanged compared to the prior MRA. RIGHT VERTEBRAL ARTERY:  Patent with antegrade flow. LEFT CAROTID ARTERY: Smooth heterogeneous atherosclerotic plaque at the carotid bifurcation extending primarily into the external carotid artery. No significant atherosclerotic plaque or evidence of stenosis in the internal carotid artery. LEFT VERTEBRAL ARTERY:  Patent with normal antegrade flow. IMPRESSION: 1. Mild (1-49%) stenosis proximal right internal carotid artery secondary to heterogenous atherosclerotic plaque. 2. No significant atherosclerotic plaque or evidence of stenosis in the left internal carotid artery. 3. Vertebral arteries are patent with normal antegrade flow. Signed, Sterling Big, MD [facility] Electronically Signed   By: Malachy Moan M.D.   On: 11/30/2016 11:49   Dg Chest Portable 1 View  Result Date: 11/29/2016 CLINICAL DATA:  Syncope, blurred vision. History CAD, diabetes, hypertension and atrial fibrillation. EXAM: PORTABLE CHEST 1 VIEW COMPARISON:  Chest x-ray dated 11/21/2016. FINDINGS: Heart size and mediastinal contours are stable. Atherosclerotic changes noted at the aortic arch. Chronic interstitial prominence is stable. No pleural effusion or pneumothorax seen. No acute or suspicious osseous finding. Elevation of the right hemidiaphragm is stable. IMPRESSION: No active disease.  No evidence of pneumonia or pulmonary edema. Aortic atherosclerosis. Electronically Signed   By: Bary Richard M.D.   On: 11/29/2016 13:28   Dg Hip Unilat  With Pelvis 2-3 Views Left  Result Date: 11/30/2016 CLINICAL DATA:  83 year old male with left hip pain to the touch with no known injury. EXAM: DG HIP (WITH OR WITHOUT PELVIS) 2-3V LEFT COMPARISON:  Prior chest in lumbar spine radiographs, but no prior pelvis or hip radiograph. FINDINGS: Nearly 4.5 cm rounded mixed lucent and sclerotic lesion along the left iliac wing has a narrow zone of transition and a benign appearance. Elsewhere bone mineralization is within normal limits for age. The pelvis appears intact.  Questionable SI joint ankylosis. Both femoral heads are normally located. There is moderate to severe degenerative acetabular spurring on the left. Moderate right acetabular spurring and sclerosis. Similar mild to moderate narrowing of the hip joint spaces. Proximal left femur is intact. No  acute osseous abnormality identified. Aortic and iliofemoral calcified atherosclerosis. Negative visible bowel gas pattern. Degenerative changes in the visible lumbar spine. IMPRESSION: 1. No acute osseous abnormality identified. There is a benign appearing 4.5 cm bone lesion of the left iliac wing. Moderate bilateral hip osteoarthritis. 2. If symptoms worsen or persist Left Hip MRI would be most valuable for further evaluation. CT would be the second line modality. 3. Calcified atherosclerosis. Electronically Signed   By: Odessa Fleming M.D.   On: 11/30/2016 10:35     Assessment/Plan: 83 year old male presenting after two episodes of altered awareness.  Patient amnestic of each event.  No premonitory symptoms.  MRI of the brain on last presentation was unremarkable.  Patient not orthostatic.  EEG unremarkable. Echocardiogram was technically difficult but showed no intracardiac masses or thrombi.  EF 55-60%.  Carotid dopplers show no evidence of hemodynamically significant stenosis.    Recommendations: 1. No indication for antiepileptic therapy at this time 2. Consider long term cardiac monitoring    Thana Farr, MD Neurology [phone] 11/30/2016, 5:05 PM

## 2016-11-30 NOTE — Telephone Encounter (Signed)
-----   Message from Coralee Rud sent at 11/30/2016  3:56 PM EDT ----- Regarding: tcm/ph 8/16 2:30 Eula Listen, PA

## 2016-11-30 NOTE — Progress Notes (Signed)
*  PRELIMINARY RESULTS* Echocardiogram 2D Echocardiogram has been performed.  Cristela Blue 11/30/2016, 3:10 PM

## 2016-12-01 LAB — HEMOGLOBIN A1C
Hgb A1c MFr Bld: 6.6 % — ABNORMAL HIGH (ref 4.8–5.6)
MEAN PLASMA GLUCOSE: 143 mg/dL

## 2016-12-05 LAB — GLUCOSE, CAPILLARY: Glucose-Capillary: 148 mg/dL — ABNORMAL HIGH (ref 65–99)

## 2016-12-10 ENCOUNTER — Telehealth: Payer: Self-pay | Admitting: *Deleted

## 2016-12-10 NOTE — Telephone Encounter (Signed)
Received notification from [employer] that patient has not hooked up his monitor yet. Patient sleeping, spoke with wife, ok per DPR. She stated they have the monitor and spoke to the [employer] company to let them know they will hook it up on 12/13/16 at office visit here at our office.

## 2016-12-11 ENCOUNTER — Telehealth (INDEPENDENT_AMBULATORY_CARE_PROVIDER_SITE_OTHER): Payer: Self-pay | Admitting: Orthopaedic Surgery

## 2016-12-11 NOTE — Telephone Encounter (Signed)
Patient called asked if he can get an injection in both of his knees? The number to contact patient is [phone]

## 2016-12-11 NOTE — Telephone Encounter (Signed)
Yes ma'am he may

## 2016-12-12 NOTE — Telephone Encounter (Signed)
Patient scheduled 12/26/16 at 3:15pm with Dr. Magnus Ivan

## 2016-12-13 ENCOUNTER — Ambulatory Visit (INDEPENDENT_AMBULATORY_CARE_PROVIDER_SITE_OTHER): Payer: Medicare Other | Admitting: Physician Assistant

## 2016-12-13 ENCOUNTER — Ambulatory Visit (INDEPENDENT_AMBULATORY_CARE_PROVIDER_SITE_OTHER): Payer: Medicare Other

## 2016-12-13 ENCOUNTER — Encounter: Payer: Self-pay | Admitting: Physician Assistant

## 2016-12-13 VITALS — BP 91/54 | HR 68 | Ht 71.0 in | Wt 210.8 lb

## 2016-12-13 DIAGNOSIS — E782 Mixed hyperlipidemia: Secondary | ICD-10-CM

## 2016-12-13 DIAGNOSIS — I483 Typical atrial flutter: Secondary | ICD-10-CM | POA: Diagnosis not present

## 2016-12-13 DIAGNOSIS — R55 Syncope and collapse: Secondary | ICD-10-CM

## 2016-12-13 DIAGNOSIS — I2510 Atherosclerotic heart disease of native coronary artery without angina pectoris: Secondary | ICD-10-CM

## 2016-12-13 DIAGNOSIS — I5022 Chronic systolic (congestive) heart failure: Secondary | ICD-10-CM

## 2016-12-13 DIAGNOSIS — I480 Paroxysmal atrial fibrillation: Secondary | ICD-10-CM

## 2016-12-13 DIAGNOSIS — I6523 Occlusion and stenosis of bilateral carotid arteries: Secondary | ICD-10-CM

## 2016-12-13 DIAGNOSIS — I951 Orthostatic hypotension: Secondary | ICD-10-CM | POA: Diagnosis not present

## 2016-12-13 NOTE — Patient Instructions (Signed)
Medication Instructions:  Your physician has recommended you make the following change in your medication:  1- STOP taking Carvedilol.   Labwork: none  Testing/Procedures: none  Follow-Up: Your physician recommends that you schedule a follow-up appointment in: 2-3 MONTHS WITH DR Mariah Milling.  Your provider would like you to increase your intake of fluids mainly water. Limit caffeineated and sugary beverages.     If you need a refill on your cardiac medications before your next appointment, please call your pharmacy.

## 2016-12-13 NOTE — Progress Notes (Signed)
Cardiology Office Note Date:  12/13/2016  Patient ID:  Mark Dougherty, Mark Dougherty 1927-04-23, MRN [id number] PCP:  [name], MD  Cardiologist:  Dr. Mariah Milling, MD    Chief Complaint: Hospital follow up   History of Present Illness: Mark Dougherty is a 83 y.o. male with history of CAD s/p remote CABG in 1996 in [place], postoperative Afib, atrial flutter s/p DCCV in 06/2015 on Eliquis, carotid artery stenosis s/p left cartoid endarterectomy in 2000, ICM/HFrEF with a prior EF of 40% now improved to 60% by TTE in 11/2016, BPH, DM2, HLD, spinal stenosis with chronic pain and limited mobility who is mostly chair bound, is being seen for hospital follow up after recent admission to [facility] from 8/2-8/3 for AMS.   Patient was recently started on Zanaflex in late July which significantly reduced his hip pain. On 7/25, while his daughter was driving and talking with him in the passenger seat, she noted he was not responding. She glanced over at him and noted his eyes appeared to roll back and were glazed over. There was some possible grunting vs incoherent noises. This lasted for ~ 5-10 minutes. He was brought to the [facility] ED where he noted weakness and blurry vision. Labs, head CT and MRI brain were unrevealing. He was diagnosed with a UTI and discharged home. On 8/2, after arriving at a furniture store, the patient's daughter helped him into a wheelchar, pushed him into the store where he became unresponsive and slumped in his chair. He was breathing, and may have been grunting/mumbling. EMS was called, and per the patient's wife, he regained consciousness in ~ 5-10 minutes. There were no sequelae. Upon his arrival to [facility] ED his head CT was nonacute, EKG/labs/troponins were unrevealing. While admitted, there were no recurrent symptoms and no events on telemetry. Echo on 8/3 showed an EF of 55-60%, no RWMA, GR1DD, mild to moderate aortic stenosis, mildly dilated left atrium, RV systolic function was normal, PASP was  normal. EEG was normal. Carotid ultrasound showed mild (1-49%) stenosis of the proximal RICA secondary to hetergeneous atherosclerotic plaque with no significant atherosclerotic plaque or evidence of stenosis in the LICA, vertebral arteries were patent with normal antegrade flow. Outpatient cardiac monitoring was advised.   He comes in feeling well today. No further episodes similar to the above. He has not yet started to wear the outpatient event monitor as he was uncertain how to get started with it. He did bring this with him today for our help. No chest pain or palpitations. He does not recall any events leading up to either unresponsive episode. Both episodes occurred shortly after taking Zanaflex. He has not taken that medication since. He is now taking Norco bid with significant improvement in his pain. He does not drink much water daily, ~ 40 oz total.    Past Medical History:  Diagnosis Date  . A-fib (HCC)    a. in perioperative state-->converted to NSR on amiodarone  . Atrial flutter (HCC)    a. s/p DCCV 06/29/2015  . BPH (benign prostatic hyperplasia)   . CAD (coronary artery disease)    a. s/p CABG in 1996 in [place]  . Carotid artery stenosis    a. s/p left-sided CEA 2000; b. 02/2016 Carotid U/S: <39% bilat dzs.  . Chronic systolic CHF (congestive heart failure) (HCC)    a. echo 2011 with EF 40%  . Diabetes mellitus   . Hyperlipidemia   . Hypotension    mild  . Ischemic  cardiomyopathy    a. 2011 Echo: Ef 40%.  . Left wrist fracture   . Peripheral neuropathy   . Spinal stenosis   . Thyroid disease    a. low TSH and elevated free T4 in June 2015, amiodarone was stopped at that time    Past Surgical History:  Procedure Laterality Date  . BACK SURGERY  1975  . CAROTID ENDARTERECTOMY     2010  . CORONARY ARTERY BYPASS GRAFT  1996   x3  . ELECTROPHYSIOLOGIC STUDY N/A 06/29/2015   Procedure: Cardioversion;  Surgeon: Antonieta Iba, MD;  Location: [facility] ORS;  Service:  Cardiovascular;  Laterality: N/A;  . EYE SURGERY  2013  . REPLACEMENT TOTAL KNEE     left  . TOOTH EXTRACTION      Current Meds  Medication Sig  . apixaban (ELIQUIS) 5 MG TABS tablet Take 1 tablet (5 mg total) by mouth 2 (two) times daily.  . carvedilol (COREG) 3.125 MG tablet Take 1 tablet (3.125 mg total) by mouth 2 (two) times daily.  . finasteride (PROSCAR) 5 MG tablet Take 5 mg by mouth daily.  . folic acid (FOLVITE) 1 MG tablet Take 1 mg by mouth daily.    Marland Kitchen HYDROcodone-acetaminophen (NORCO/VICODIN) 5-325 MG tablet Take 1 tablet by mouth every 6 (six) hours as needed for moderate pain.  . metFORMIN (GLUCOPHAGE) 500 MG tablet Take 500 mg by mouth 2 (two) times daily with a meal.    . simvastatin (ZOCOR) 20 MG tablet Take 1 tablet (20 mg total) by mouth at bedtime.  Marland Kitchen terazosin (HYTRIN) 1 MG capsule Take 1 mg by mouth at bedtime.    . vitamin B-12 (CYANOCOBALAMIN) 250 MCG tablet Take 250 mcg by mouth daily.      Allergies:   Patient has no known allergies.   Social History:  The patient  reports that he has never smoked. He has quit using smokeless tobacco. He reports that he does not drink alcohol or use drugs.   Family History:  The patient's Family history is unknown by patient.  ROS:   Review of Systems  Constitutional: Positive for malaise/fatigue. Negative for chills, diaphoresis, fever and weight loss.  HENT: Negative for congestion.   Eyes: Negative for discharge and redness.  Respiratory: Negative for cough, hemoptysis, sputum production, shortness of breath and wheezing.   Cardiovascular: Negative for chest pain, palpitations, orthopnea, claudication, leg swelling and PND.  Gastrointestinal: Negative for abdominal pain, blood in stool, heartburn, melena, nausea and vomiting.  Genitourinary: Negative for hematuria.  Musculoskeletal: Positive for back pain, falls, joint pain and myalgias.       Mechanical fall on 8/6 after missing the chair he was attempting to sit  down on  Skin: Negative for rash.  Neurological: Negative for dizziness, tingling, tremors, sensory change, speech change, focal weakness, loss of consciousness and weakness.  Endo/Heme/Allergies: Does not bruise/bleed easily.  Psychiatric/Behavioral: Negative for substance abuse. The patient is not nervous/anxious.   All other systems reviewed and are negative.    PHYSICAL EXAM:  VS:  BP (!) 91/54 (BP Location: Left Arm, Patient Position: Sitting, Cuff Size: Large)   Pulse 68   Ht 5\' 11"  (1.803 m)   Wt 210 lb 12 oz (95.6 kg)   BMI 29.39 kg/m  BMI: Body mass index is 29.39 kg/m.  Physical Exam  Constitutional: He is oriented to person, place, and time. He appears well-developed and well-nourished.  HENT:  Head: Normocephalic and atraumatic.  Eyes: Right eye exhibits  no discharge. Left eye exhibits no discharge.  Neck: Normal range of motion. No JVD present.  Cardiovascular: Normal rate, regular rhythm, S1 normal and S2 normal.  Exam reveals no distant heart sounds, no friction rub, no midsystolic click and no opening snap.   Murmur heard.  Harsh midsystolic murmur is present with a grade of 2/6  at the upper right sternal border radiating to the neck Pulmonary/Chest: Effort normal and breath sounds normal. No respiratory distress. He has no decreased breath sounds. He has no wheezes. He has no rales. He exhibits no tenderness.  Abdominal: Soft. He exhibits no distension. There is no tenderness.  Musculoskeletal: He exhibits no edema.  Neurological: He is alert and oriented to person, place, and time.  Skin: Skin is warm and dry. No cyanosis. Nails show no clubbing.  Psychiatric: He has a normal mood and affect. His speech is normal and behavior is normal. Judgment and thought content normal.     EKG:  Was ordered and interpreted by me today. Shows NSR, 68 bpm, left axis deviation, rare PVC, poor R wave progression  Recent Labs: 11/30/2016: ALT 10; BUN 28; Creatinine, Ser 1.06;  Hemoglobin 11.7; Platelets 225; Potassium 4.4; Sodium 138; TSH 2.918  No results found for requested labs within last 8760 hours.   Estimated Creatinine Clearance: 56.8 mL/min (by C-G formula based on SCr of 1.06 mg/dL).   Wt Readings from Last 3 Encounters:  12/13/16 210 lb 12 oz (95.6 kg)  11/30/16 213 lb 14.4 oz (97 kg)  11/21/16 213 lb (96.6 kg)     Orthostatic Vital Signs: Lying: 134/75, 67 bpm Sitting: 92/53, 80bpm Standing: unable to obtain Standing x 3 minutes: unable to obtain  Other studies reviewed: Additional studies/records reviewed today include: summarized above  ASSESSMENT AND PLAN:  1. AMS/presyncope/possible syncope: No further episodes. Patient and family believe this was related to his muscle relaxer, which he is no longer taking. He has not yet started the 30-day event monitor, though has it with him today for [place] to hook up. Call for any recurrent symptoms so we can correlate with outpatient monitor. He is currently taking Norco 1 tab in the morning and 1/2 tab in the evening with great improvement in his hip/back/leg pain. Cautious use of future Norco is advised given his age and history.   2. Orthostatic hypotension: Orthostatic vital signs as above. Hold Coreg. Increase fluids. Consider holding narcotic pain medication, defer to treating provider. I doubt this was playing a role in his two unresponsive episodes as they were not related to positional changes. He just ordered a BP cuff for home, bring to next appointment to correlation with ours.   3. CAD s/p CABG as above: Negative troponin while admitted. Echo with improved, now normal, EF without WMA. No symptoms concerning for ischemia. No plans for ischemic evaluation at this time.   4. ICM: EF improved by most recent echo as above. He does not appear volume overloaded at this time. Coreg held as above given orthostatic hypotension.   5. Afib/flutter: Currently in sinus rhythm with a heart rate in the 60s bpm.  Coreg held as above given orthostatic hypotension. CHADS2VASc at least 5 (CHF, age x 2, DM, vascular disease). Continue Eliquis 5 mg bid.   6. HLD: Continue simvastatin.   Disposition: F/u with Dr. Mariah Milling in 2 months.   Current medicines are reviewed at length with the patient today.  The patient did not have any concerns regarding medicines.  Signed, Eula Listen  PA-C 12/13/2016 2:32 PM     [facility] [place] [place] [address] [place], [place] [address] [phone]

## 2016-12-17 ENCOUNTER — Ambulatory Visit (INDEPENDENT_AMBULATORY_CARE_PROVIDER_SITE_OTHER): Payer: Medicare Other | Admitting: Podiatry

## 2016-12-17 ENCOUNTER — Encounter: Payer: Self-pay | Admitting: Podiatry

## 2016-12-17 DIAGNOSIS — B351 Tinea unguium: Secondary | ICD-10-CM | POA: Diagnosis not present

## 2016-12-17 DIAGNOSIS — E118 Type 2 diabetes mellitus with unspecified complications: Secondary | ICD-10-CM

## 2016-12-17 DIAGNOSIS — M79676 Pain in unspecified toe(s): Secondary | ICD-10-CM | POA: Diagnosis not present

## 2016-12-17 NOTE — Progress Notes (Signed)
Complaint:  Visit Type: Patient returns to my office for continued preventative foot care services. Complaint: Patient states" my nails have grown long and thick and become painful to walk and wear shoes" Patient has been diagnosed with DM with no foot complications. The patient presents for preventative foot care services. No changes to ROS  Podiatric Exam: Vascular: dorsalis pedis and posterior tibial pulses are palpable bilateral. Capillary return is immediate. Temperature gradient is WNL. Skin turgor WNL  Sensorium: Normal Semmes Weinstein monofilament test. Normal tactile sensation bilaterally. Nail Exam: Pt has thick disfigured discolored nails with subungual debris noted bilateral entire nail hallux through fifth toenails Ulcer Exam: There is no evidence of ulcer or pre-ulcerative changes or infection. Orthopedic Exam: Muscle tone and strength are WNL. No limitations in general ROM. No crepitus or effusions noted. Foot type and digits show no abnormalities.  Mild HAV  B/L Skin: No Porokeratosis. No infection or ulcers  Diagnosis:  Onychomycosis, , Pain in right toe, pain in left toes  Treatment & Plan Procedures and Treatment: Consent by patient was obtained for treatment procedures. The patient understood the discussion of treatment and procedures well. All questions were answered thoroughly reviewed. Debridement of mycotic and hypertrophic toenails, 1 through 5 bilateral and clearing of subungual debris. No ulceration, no infection noted.  Patient has ordered diabetic shoes elsewhere, Return Visit-Office Procedure: Patient instructed to return to the office for a follow up visit 3 months for continued evaluation and treatment.    Lucah Petta DPM 

## 2016-12-26 ENCOUNTER — Ambulatory Visit (INDEPENDENT_AMBULATORY_CARE_PROVIDER_SITE_OTHER): Payer: Medicare Other | Admitting: Orthopaedic Surgery

## 2017-01-02 ENCOUNTER — Encounter: Payer: Self-pay | Admitting: Cardiology

## 2017-01-02 ENCOUNTER — Telehealth: Payer: Self-pay | Admitting: Cardiology

## 2017-01-02 NOTE — Telephone Encounter (Signed)
[facility] called stating that patient had 9 beat run of VT with underlying rhythm NSR.  Patient was asleep at the time and says that he feels fine.  2D echo recently showed normal LVF.  Will forward this to Dr. Mariah Milling and Dr. Graciela Husbands and told patient and his wife that he will likely here from someone in our office tomorrow with further instructions.  Given normal LVF and no symptoms tonight will continue to monitor.

## 2017-01-02 NOTE — Telephone Encounter (Signed)
Spoke with someone at Dr. Maree Krabbe office and they did in fact order a cmet, lipid panel, and A1c level. Requested that they add on magnesium level and they state that the patient would need to get stuck again. Will try to call [facility] to have them add on this test to existing labs.

## 2017-01-02 NOTE — Telephone Encounter (Signed)
Spoke with Clydie Braun at Dr. Maree Krabbe office and she states that the lab samples are still in the office and she will add on a magnesium level to be done. Faxed request to add on mag level and to fax results to our office.

## 2017-01-02 NOTE — Telephone Encounter (Signed)
Spoke with patient and his wife. They report they had some labs done this morning at Dr. Maree Krabbe office. Will call over to their office to see if they could send [place] those results when available.

## 2017-01-02 NOTE — Telephone Encounter (Signed)
Spoke with [facility] and the samples have not been received yet to add on testing. Will call back later to add on Magnesium level.

## 2017-01-02 NOTE — Telephone Encounter (Signed)
Patient has been unable to tolerate Coreg given orthostasis. Please have him come in for a potassium and magnesium level. May need to retry beta blocker if EP recommends.

## 2017-01-03 ENCOUNTER — Telehealth: Payer: Self-pay | Admitting: *Deleted

## 2017-01-03 MED ORDER — MAGNESIUM OXIDE 400 MG PO TABS: 400.0000 mg | ORAL_TABLET | Freq: Every day | ORAL | 6 refills | Status: DC

## 2017-01-03 NOTE — Telephone Encounter (Signed)
This encounter was created in error - please disregard.

## 2017-01-03 NOTE — Telephone Encounter (Signed)
Reviewed results and recommendations with patient. Instructed him to check with pharmacy regarding cost of medication verses over the counter and advised him to make sure it is Magnesium Oxide 400 mg daily. He verbalized understanding of instructions with no further questions at this time. Let him know to please call back if he should have any further questions.

## 2017-01-03 NOTE — Telephone Encounter (Signed)
-----   Message from Sondra Barges, PA-C sent at 01/03/2017 10:53 AM EDT ----- Please call the patient. Results reviewed. Potassium at goal. Magnesium slightly low. Please start magnesium oxide 400 mg daily. Await EP recommendations for possibly restarting beta blocker.

## 2017-01-03 NOTE — Telephone Encounter (Signed)
Labs reviewed. Await magnesium.

## 2017-01-05 ENCOUNTER — Encounter: Payer: Self-pay | Admitting: *Deleted

## 2017-01-05 ENCOUNTER — Emergency Department
Admission: EM | Admit: 2017-01-05 | Discharge: 2017-01-05 | Disposition: A | Discharge status: Home / Self Care | Payer: Medicare Other | Attending: Emergency Medicine | Admitting: Emergency Medicine

## 2017-01-05 DIAGNOSIS — Z7901 Long term (current) use of anticoagulants: Secondary | ICD-10-CM | POA: Diagnosis not present

## 2017-01-05 DIAGNOSIS — Z7984 Long term (current) use of oral hypoglycemic drugs: Secondary | ICD-10-CM | POA: Insufficient documentation

## 2017-01-05 DIAGNOSIS — I110 Hypertensive heart disease with heart failure: Secondary | ICD-10-CM | POA: Diagnosis not present

## 2017-01-05 DIAGNOSIS — B372 Candidiasis of skin and nail: Secondary | ICD-10-CM | POA: Insufficient documentation

## 2017-01-05 DIAGNOSIS — E119 Type 2 diabetes mellitus without complications: Secondary | ICD-10-CM | POA: Insufficient documentation

## 2017-01-05 DIAGNOSIS — R319 Hematuria, unspecified: Secondary | ICD-10-CM

## 2017-01-05 DIAGNOSIS — E039 Hypothyroidism, unspecified: Secondary | ICD-10-CM | POA: Diagnosis not present

## 2017-01-05 DIAGNOSIS — I5022 Chronic systolic (congestive) heart failure: Secondary | ICD-10-CM | POA: Insufficient documentation

## 2017-01-05 DIAGNOSIS — N390 Urinary tract infection, site not specified: Secondary | ICD-10-CM

## 2017-01-05 DIAGNOSIS — I25810 Atherosclerosis of coronary artery bypass graft(s) without angina pectoris: Secondary | ICD-10-CM | POA: Diagnosis not present

## 2017-01-05 DIAGNOSIS — Z79899 Other long term (current) drug therapy: Secondary | ICD-10-CM | POA: Insufficient documentation

## 2017-01-05 DIAGNOSIS — Z87891 Personal history of nicotine dependence: Secondary | ICD-10-CM | POA: Insufficient documentation

## 2017-01-05 LAB — COMPREHENSIVE METABOLIC PANEL
ALBUMIN: 3.5 g/dL (ref 3.5–5.0)
ALT: 12 U/L — ABNORMAL LOW (ref 17–63)
ANION GAP: 6 (ref 5–15)
AST: 24 U/L (ref 15–41)
Alkaline Phosphatase: 71 U/L (ref 38–126)
BUN: 25 mg/dL — ABNORMAL HIGH (ref 6–20)
CHLORIDE: 102 mmol/L (ref 101–111)
CO2: 27 mmol/L (ref 22–32)
Calcium: 9.2 mg/dL (ref 8.9–10.3)
Creatinine, Ser: 1.17 mg/dL (ref 0.61–1.24)
GFR calc Af Amer: >60 mL/min (ref >60)
GFR calc non Af Amer: 54 mL/min — ABNORMAL LOW (ref >60)
GLUCOSE: 122 mg/dL — AB (ref 65–99)
Potassium: 5 mmol/L (ref 3.5–5.1)
SODIUM: 135 mmol/L (ref 135–145)
TOTAL PROTEIN: 7.4 g/dL (ref 6.5–8.1)
Total Bilirubin: 0.7 mg/dL (ref 0.3–1.2)

## 2017-01-05 LAB — URINALYSIS, COMPLETE (UACMP) WITH MICROSCOPIC
Bilirubin Urine: NEGATIVE
Glucose, UA: NEGATIVE mg/dL
KETONES UR: NEGATIVE mg/dL
Nitrite: NEGATIVE
PROTEIN: 100 mg/dL — AB
Specific Gravity, Urine: 1.01 (ref 1.005–1.030)
pH: 6 (ref 5.0–8.0)

## 2017-01-05 LAB — CBC WITH DIFFERENTIAL/PLATELET
Basophils Absolute: 0 10*3/uL (ref 0–0.1)
Basophils Relative: 0 %
Eosinophils Absolute: 0.2 10*3/uL (ref 0–0.7)
Eosinophils Relative: 2 %
HEMATOCRIT: 35.8 % — AB (ref 40.0–52.0)
Hemoglobin: 12.5 g/dL — ABNORMAL LOW (ref 13.0–18.0)
Lymphocytes Relative: 30 %
Lymphs Abs: 2.9 10*3/uL (ref 1.0–3.6)
MCH: 32.6 pg (ref 26.0–34.0)
MCHC: 34.8 g/dL (ref 32.0–36.0)
MCV: 93.8 fL (ref 80.0–100.0)
MONO ABS: 1 10*3/uL (ref 0.2–1.0)
MONOS PCT: 10 %
NEUTROS ABS: 5.5 10*3/uL (ref 1.4–6.5)
Neutrophils Relative %: 58 %
Platelets: 255 10*3/uL (ref 150–440)
RBC: 3.82 MIL/uL — ABNORMAL LOW (ref 4.40–5.90)
RDW: 13.4 % (ref 11.5–14.5)
WBC: 9.7 10*3/uL (ref 3.8–10.6)

## 2017-01-05 LAB — GLUCOSE, CAPILLARY: Glucose-Capillary: 129 mg/dL — ABNORMAL HIGH (ref 65–99)

## 2017-01-05 MED ORDER — CIPROFLOXACIN HCL 500 MG PO TABS: 500.0000 mg | ORAL_TABLET | Freq: Two times a day (BID) | ORAL | 0 refills | Status: DC

## 2017-01-05 MED ORDER — CIPROFLOXACIN HCL 500 MG PO TABS
500.0000 mg | ORAL_TABLET | Freq: Once | ORAL | Status: AC
  Administered 2017-01-05: 500 mg via ORAL
  Filled 2017-01-05: qty 1

## 2017-01-05 MED ORDER — NYSTATIN 100000 UNIT/GM EX POWD: Freq: Four times a day (QID) | CUTANEOUS | 0 refills | Status: DC

## 2017-01-05 MED ORDER — CEFTRIAXONE SODIUM 1 G IJ SOLR
1.0000 g | Freq: Once | INTRAMUSCULAR | Status: AC
  Administered 2017-01-05: 1 g via INTRAMUSCULAR
  Filled 2017-01-05: qty 10

## 2017-01-05 NOTE — Discharge Instructions (Signed)
you were evaluated for blood in the urine and found to have urinary tract infection. You were given 24-hour dose of Rocephin antibiotic as well as 1 dose of Cipro antibiotic pill here in the emergency room.  Return to the emergency department immediately for any confusion altered mental status, abdominal pain, vomiting, fever, or any other symptoms concerning to you.

## 2017-01-05 NOTE — ED Provider Notes (Signed)
[facility] Emergency Department Provider Note ____________________________________________   I have reviewed the triage vital signs and the triage nursing note.  HISTORY  Chief Complaint Hematuria   Historian Patient  HPI Mark Dougherty is a 83 y.o. male Arrived by EMS after being called out for a fall which is apparently sliding down from his left. Patient denies any traumatic injury. Patient states that he is here because he had hematuria today. He states that he had trouble urinating yesterday and the day before. He was taking some water yesterday and had clear urine. This morning and overnight there was bloody urine. He states that it is very painful when he is urinating. Denies abdominal pain or flank pain. Denies fevers. Denies nausea or vomiting.    Past Medical History:  Diagnosis Date  . A-fib (HCC)    a. in perioperative state-->converted to NSR on amiodarone  . Atrial flutter (HCC)    a. s/p DCCV 06/29/2015  . BPH (benign prostatic hyperplasia)   . CAD (coronary artery disease)    a. s/p CABG in 1996 in [place]  . Carotid artery stenosis    a. s/p left-sided CEA 2000; b. 02/2016 Carotid U/S: <39% bilat dzs.  . Chronic systolic CHF (congestive heart failure) (HCC)    a. echo 2011 with EF 40%  . Diabetes mellitus   . Hyperlipidemia   . Hypotension    mild  . Ischemic cardiomyopathy    a. 2011 Echo: Ef 40%.  . Left wrist fracture   . Peripheral neuropathy   . Spinal stenosis   . Thyroid disease    a. low TSH and elevated free T4 in June 2015, amiodarone was stopped at that time    Patient Active Problem List   Diagnosis Date Noted  . Syncope 11/29/2016  . Acute left-sided low back pain without sciatica 11/12/2016  . Chronic systolic CHF (congestive heart failure) (HCC)   . Ischemic cardiomyopathy   . Thyroid disease   . Typical atrial flutter (HCC)   . Atrial flutter (HCC) 05/31/2015  . Neuropathy 05/31/2015  . Atrial fibrillation (HCC)  01/04/2011  . Congestive dilated cardiomyopathy (HCC) 09/28/2010  . Dehydration 09/14/2010  . Diabetes mellitus type 2, controlled, with complications (HCC) 01/09/2010  . Hyperlipidemia 01/09/2010  . CAD (coronary artery disease) 01/09/2010  . Carotid stenosis 01/09/2010    Past Surgical History:  Procedure Laterality Date  . BACK SURGERY  1975  . CAROTID ENDARTERECTOMY     2010  . CORONARY ARTERY BYPASS GRAFT  1996   x3  . ELECTROPHYSIOLOGIC STUDY N/A 06/29/2015   Procedure: Cardioversion;  Surgeon: Antonieta Iba, MD;  Location: [facility] ORS;  Service: Cardiovascular;  Laterality: N/A;  . EYE SURGERY  2013  . REPLACEMENT TOTAL KNEE     left  . TOOTH EXTRACTION      Prior to Admission medications   Medication Sig Start Date End Date Taking? Authorizing Provider  apixaban (ELIQUIS) 5 MG TABS tablet Take 1 tablet (5 mg total) by mouth 2 (two) times daily. 04/03/16   Dunn, Raymon Mutton, PA-C  ciprofloxacin (CIPRO) 500 MG tablet Take 1 tablet (500 mg total) by mouth 2 (two) times daily. 01/05/17   Governor Rooks, MD  finasteride (PROSCAR) 5 MG tablet Take 5 mg by mouth daily.    [provider]  folic acid (FOLVITE) 1 MG tablet Take 1 mg by mouth daily.      [provider]  HYDROcodone-acetaminophen (NORCO/VICODIN) 5-325 MG tablet Take  1 tablet by mouth every 6 (six) hours as needed for moderate pain. 11/12/16   Kathryne Hitch, MD  magnesium oxide (MAG-OX) 400 MG tablet Take 1 tablet (400 mg total) by mouth daily. 01/03/17   Dunn, Raymon Mutton, PA-C  metFORMIN (GLUCOPHAGE) 500 MG tablet Take 500 mg by mouth 2 (two) times daily with a meal.      [provider]  nystatin (MYCOSTATIN/NYSTOP) powder Apply topically 4 (four) times daily. 01/05/17   Governor Rooks, MD  simvastatin (ZOCOR) 20 MG tablet Take 1 tablet (20 mg total) by mouth at bedtime. 10/05/16   Antonieta Iba, MD  terazosin (HYTRIN) 1 MG capsule Take 1 mg by mouth at bedtime.      [provider]   vitamin B-12 (CYANOCOBALAMIN) 250 MCG tablet Take 250 mcg by mouth daily.      [provider]    No Known Allergies  Family History  Problem Relation Age of Onset  . Family history unknown: Yes    Social History Social History  Substance Use Topics  . Smoking status: Never Smoker  . Smokeless tobacco: Former Neurosurgeon  . Alcohol use No    Review of Systems  Constitutional: Negative for fever. Eyes: Negative for visual changes. ENT: Negative for sore throat. Cardiovascular: Negative for chest pain. Respiratory: Negative for shortness of breath. Gastrointestinal: Negative for  vomiting and diarrhea. Genitourinary: positive for dysuria. Musculoskeletal: Negative for back pain. Skin: Negative for rash. Neurological: Negative for headache.  ____________________________________________   PHYSICAL EXAM:  VITAL SIGNS: ED Triage Vitals  Enc Vitals Group     BP 01/05/17 1631 (!) 153/85     Pulse Rate 01/05/17 1631 85     Resp 01/05/17 1631 16     Temp 01/05/17 1631 (!) 97.4 F (36.3 C)     Temp Source 01/05/17 1631 Oral     SpO2 01/05/17 1627 96 %     Weight 01/05/17 1630 216 lb (98 kg)     Height 01/05/17 1630  (1.803 m)     Head Circumference --      Peak Flow --      Pain Score --      Pain Loc --      Pain Edu? --      Excl. in GC? --      Constitutional: Alert and oriented. Well appearing and in no distress.  hard of hearing. HEENT   Head: Normocephalic and atraumatic.      Eyes: Conjunctivae are normal. Pupils equal and round.       Ears:         Nose: No congestion/rhinnorhea.   Mouth/Throat: Mucous membranes are moist.   Neck: No stridor. Cardiovascular/Chest: Normal rate, regular rhythm.  No murmurs, rubs, or gallops. Respiratory: Normal respiratory effort without tachypnea nor retractions. Breath sounds are clear and equal bilaterally. No wheezes/rales/rhonchi. Gastrointestinal: Soft. No distention, no guarding, no rebound.  Nontender.    Genitourinary/rectal:  circumcised. Bloody urine in his diaper.  yeast in the right groin fold. Musculoskeletal: Nontender with normal range of motion in all extremities. No joint effusions.  No lower extremity tenderness.  No edema. Neurologic:  Normal speech and language. No gross or focal neurologic deficits are appreciated. Skin:  Skin is warm, dry and intact. No rash noted. Psychiatric: Mood and affect are normal. Speech and behavior are normal. Patient exhibits appropriate insight and judgment.   ____________________________________________  LABS (pertinent positives/negatives)  Labs Reviewed  URINALYSIS, COMPLETE (UACMP)  WITH MICROSCOPIC - Abnormal; Notable for the following:       Result Value   Color, Urine YELLOW (*)    APPearance CLOUDY (*)    Hgb urine dipstick LARGE (*)    Protein, ur 100 (*)    Leukocytes, UA LARGE (*)    Bacteria, UA FEW (*)    Squamous Epithelial / LPF 0-5 (*)    All other components within normal limits  GLUCOSE, CAPILLARY - Abnormal; Notable for the following:    Glucose-Capillary 129 (*)    All other components within normal limits  COMPREHENSIVE METABOLIC PANEL - Abnormal; Notable for the following:    Glucose, Bld 122 (*)    BUN 25 (*)    ALT 12 (*)    GFR calc non Af Amer 54 (*)    All other components within normal limits  CBC WITH DIFFERENTIAL/PLATELET - Abnormal; Notable for the following:    RBC 3.82 (*)    Hemoglobin 12.5 (*)    HCT 35.8 (*)    All other components within normal limits  URINE CULTURE  CBG MONITORING, ED    ____________________________________________    EKG I, Governor Rooks, MD, the attending physician have personally viewed and interpreted all ECGs.  none ____________________________________________  RADIOLOGY All Xrays were viewed by me. Imaging interpreted by Radiologist.  None __________________________________________  PROCEDURES  Procedure(s) performed: None  Critical Care  performed: None  ____________________________________________   ED COURSE / ASSESSMENT AND PLAN  Pertinent labs & imaging results that were available during my care of the patient were reviewed by me and considered in my medical decision making (see chart for details).    urinalysis consistent with urinary tract infection. Urine culture was sent. Initially prescribed IM Rocephin. After discussing with the patient and his spouse about several months ago he was changed from Keflex to another antibiotic, I did review his culture and saw that he grew enterococcus and was switched to amoxicillin. I am going to go ahead and give him a dose of Cipro as well as discharge with prescription for Cipro. We did discuss risk of tendon rupture.  He also has yeast and I write prescription for nystatin.  He has no evidence for sepsis. He lives with his wife and I think is okay for outpatient management and they agree.    CONSULTATIONS:  None  Patient / Family / Caregiver informed of clinical course, medical decision-making process, and agree with plan.   I discussed return precautions, follow-up instructions, and discharge instructions with patient and/or family.  Discharge Instructions : you were evaluated for blood in the urine and found to have urinary tract infection. You were given 24-hour dose of Rocephin antibiotic as well as 1 dose of Cipro antibiotic pill here in the emergency room.  Return to the emergency department immediately for any confusion altered mental status, abdominal pain, vomiting, fever, or any other symptoms concerning to you.  ___________________________________________   FINAL CLINICAL IMPRESSION(S) / ED DIAGNOSES   Final diagnoses:  Hematuria, unspecified type  Urinary tract infection with hematuria, site unspecified  Yeast dermatitis              Note: This dictation was prepared with Dragon dictation. Any transcriptional errors that result from this  process are unintentional    Governor Rooks, MD 01/05/17 [phone]

## 2017-01-05 NOTE — ED Notes (Signed)
Patient was cleaned, brief was changed by this RN and [facility] ED tech. Patient was given ED sandwich tray per Dr. Shaune Pollack.

## 2017-01-05 NOTE — ED Triage Notes (Signed)
Per EMS report, patient c/o hematuria and burning during urination. Per EMS report, patient was recently treated for a UTI. Patient also reported sliding out of his lift chair today, but denies injuries. Patient is alert and oriented.

## 2017-01-05 NOTE — ED Notes (Signed)
Patient given paper scrubs.

## 2017-01-07 LAB — URINE CULTURE: CULTURE: NO GROWTH

## 2017-01-23 ENCOUNTER — Emergency Department
Admission: EM | Admit: 2017-01-23 | Discharge: 2017-01-23 | Disposition: A | Discharge status: Home / Self Care | Payer: Medicare Other | Attending: Emergency Medicine | Admitting: Emergency Medicine

## 2017-01-23 ENCOUNTER — Emergency Department: Payer: Medicare Other

## 2017-01-23 ENCOUNTER — Telehealth: Payer: Self-pay | Admitting: Cardiovascular Disease

## 2017-01-23 ENCOUNTER — Encounter: Payer: Self-pay | Admitting: Emergency Medicine

## 2017-01-23 DIAGNOSIS — I2510 Atherosclerotic heart disease of native coronary artery without angina pectoris: Secondary | ICD-10-CM | POA: Insufficient documentation

## 2017-01-23 DIAGNOSIS — Z7984 Long term (current) use of oral hypoglycemic drugs: Secondary | ICD-10-CM | POA: Diagnosis not present

## 2017-01-23 DIAGNOSIS — I5022 Chronic systolic (congestive) heart failure: Secondary | ICD-10-CM | POA: Insufficient documentation

## 2017-01-23 DIAGNOSIS — G8929 Other chronic pain: Secondary | ICD-10-CM | POA: Diagnosis not present

## 2017-01-23 DIAGNOSIS — Z951 Presence of aortocoronary bypass graft: Secondary | ICD-10-CM | POA: Diagnosis not present

## 2017-01-23 DIAGNOSIS — M25511 Pain in right shoulder: Secondary | ICD-10-CM | POA: Insufficient documentation

## 2017-01-23 DIAGNOSIS — E1140 Type 2 diabetes mellitus with diabetic neuropathy, unspecified: Secondary | ICD-10-CM | POA: Diagnosis not present

## 2017-01-23 DIAGNOSIS — Z7901 Long term (current) use of anticoagulants: Secondary | ICD-10-CM | POA: Diagnosis not present

## 2017-01-23 DIAGNOSIS — I4891 Unspecified atrial fibrillation: Secondary | ICD-10-CM | POA: Diagnosis not present

## 2017-01-23 LAB — COMPREHENSIVE METABOLIC PANEL
ALT: 9 U/L — ABNORMAL LOW (ref 17–63)
AST: 28 U/L (ref 15–41)
Albumin: 3.4 g/dL — ABNORMAL LOW (ref 3.5–5.0)
Alkaline Phosphatase: 70 U/L (ref 38–126)
Anion gap: 8 (ref 5–15)
BUN: 24 mg/dL — ABNORMAL HIGH (ref 6–20)
CALCIUM: 9.1 mg/dL (ref 8.9–10.3)
CO2: 25 mmol/L (ref 22–32)
CREATININE: 1.15 mg/dL (ref 0.61–1.24)
Chloride: 103 mmol/L (ref 101–111)
GFR calc non Af Amer: 55 mL/min — ABNORMAL LOW (ref >60)
GLUCOSE: 158 mg/dL — AB (ref 65–99)
Potassium: 4.2 mmol/L (ref 3.5–5.1)
SODIUM: 136 mmol/L (ref 135–145)
Total Bilirubin: 0.8 mg/dL (ref 0.3–1.2)
Total Protein: 7.5 g/dL (ref 6.5–8.1)

## 2017-01-23 LAB — CBC
HCT: 38 % — ABNORMAL LOW (ref 40.0–52.0)
HEMOGLOBIN: 12.8 g/dL — AB (ref 13.0–18.0)
MCH: 31.7 pg (ref 26.0–34.0)
MCHC: 33.6 g/dL (ref 32.0–36.0)
MCV: 94.3 fL (ref 80.0–100.0)
Platelets: 264 10*3/uL (ref 150–440)
RBC: 4.03 MIL/uL — ABNORMAL LOW (ref 4.40–5.90)
RDW: 13.1 % (ref 11.5–14.5)
WBC: 12.2 10*3/uL — ABNORMAL HIGH (ref 3.8–10.6)

## 2017-01-23 LAB — TROPONIN I: Troponin I: <0.03 ng/mL (ref <0.03)

## 2017-01-23 NOTE — Telephone Encounter (Signed)
Pt wife calling stating starting last night patient would have cp with his breathing.  He is still having the pains He is sitting right now with a heating pad on chest, it does seems to help He has not taken any nitro Only when he breathes it hurts Would like some advise  Please call back

## 2017-01-23 NOTE — ED Triage Notes (Signed)
Pt arrived via EMS from home for reports of right shoulder pain that worsens with deep breath. EMS reports 126/72, 98% RA, HR 120-140, hx of a-fib. Pt alert and oriented on arrival. No apparent distress noted.

## 2017-01-23 NOTE — Telephone Encounter (Signed)
Discussed with Dr. Okey Dupre who advised that pain sounds musculoskeletal and should f/u with PCP as soon as possible, hopefully today. Proceed to ED if worsens.  Called patient and wife back. She stated they have an appointment with PCP today at 3pm but PCP suggested they call the EMT's to come and evaluate patient. EMT's were there at this time evaluating patient. Wife verbalized understanding to go to ED if advised.

## 2017-01-23 NOTE — ED Provider Notes (Signed)
[facility] Emergency Department Provider Note  ____________________________________________  Time seen: Approximately 3:25 PM  I have reviewed the triage vital signs and the nursing notes.   HISTORY  Chief Complaint Shoulder Pain and Shortness of Breath    HPI Mark Dougherty is a 83 y.o. male who complains of acute on chronic right shoulder pain.  Hurts to move. No exertional symptoms, nonpleuritic. Symptoms are intermittent lasting a few minutes at a time, nonradiating. No alleviating factors. No recent injuries or falls. No cough fever chills or sweats.     Past Medical History:  Diagnosis Date  . A-fib (HCC)    a. in perioperative state-->converted to NSR on amiodarone  . Atrial flutter (HCC)    a. s/p DCCV 06/29/2015  . BPH (benign prostatic hyperplasia)   . CAD (coronary artery disease)    a. s/p CABG in 1996 in [place]  . Carotid artery stenosis    a. s/p left-sided CEA 2000; b. 02/2016 Carotid U/S: <39% bilat dzs.  . Chronic systolic CHF (congestive heart failure) (HCC)    a. echo 2011 with EF 40%  . Diabetes mellitus   . Hyperlipidemia   . Hypotension    mild  . Ischemic cardiomyopathy    a. 2011 Echo: Ef 40%.  . Left wrist fracture   . Peripheral neuropathy   . Spinal stenosis   . Thyroid disease    a. low TSH and elevated free T4 in June 2015, amiodarone was stopped at that time     Patient Active Problem List   Diagnosis Date Noted  . Syncope 11/29/2016  . Acute left-sided low back pain without sciatica 11/12/2016  . Chronic systolic CHF (congestive heart failure) (HCC)   . Ischemic cardiomyopathy   . Thyroid disease   . Typical atrial flutter (HCC)   . Atrial flutter (HCC) 05/31/2015  . Neuropathy 05/31/2015  . Atrial fibrillation (HCC) 01/04/2011  . Congestive dilated cardiomyopathy (HCC) 09/28/2010  . Dehydration 09/14/2010  . Diabetes mellitus type 2, controlled, with complications (HCC) 01/09/2010  . Hyperlipidemia  01/09/2010  . CAD (coronary artery disease) 01/09/2010  . Carotid stenosis 01/09/2010     Past Surgical History:  Procedure Laterality Date  . BACK SURGERY  1975  . CAROTID ENDARTERECTOMY     2010  . CORONARY ARTERY BYPASS GRAFT  1996   x3  . ELECTROPHYSIOLOGIC STUDY N/A 06/29/2015   Procedure: Cardioversion;  Surgeon: Antonieta Iba, MD;  Location: [facility] ORS;  Service: Cardiovascular;  Laterality: N/A;  . EYE SURGERY  2013  . REPLACEMENT TOTAL KNEE     left  . TOOTH EXTRACTION       Prior to Admission medications   Medication Sig Start Date End Date Taking? Authorizing Provider  apixaban (ELIQUIS) 5 MG TABS tablet Take 1 tablet (5 mg total) by mouth 2 (two) times daily. 04/03/16  Yes Dunn, Raymon Mutton, PA-C  carvedilol (COREG) 3.125 MG tablet Take 3.125 mg by mouth daily.   Yes [provider]  finasteride (PROSCAR) 5 MG tablet Take 5 mg by mouth daily.   Yes [provider]  folic acid (FOLVITE) 1 MG tablet Take 1 mg by mouth daily.     Yes [provider]  HYDROcodone-acetaminophen (NORCO/VICODIN) 5-325 MG tablet Take 1 tablet by mouth every 6 (six) hours as needed for moderate pain. 11/12/16  Yes Kathryne Hitch, MD  magnesium oxide (MAG-OX) 400 MG tablet Take 1 tablet (400 mg total) by mouth daily. 01/03/17  Yes Eula Listen M, PA-C  metFORMIN (GLUCOPHAGE) 500 MG tablet Take 500 mg by mouth 2 (two) times daily with a meal.     Yes [provider]  simvastatin (ZOCOR) 20 MG tablet Take 1 tablet (20 mg total) by mouth at bedtime. 10/05/16  Yes Gollan, Tollie Pizza, MD  terazosin (HYTRIN) 1 MG capsule Take 1 mg by mouth at bedtime.     Yes [provider]  vitamin B-12 (CYANOCOBALAMIN) 250 MCG tablet Take 250 mcg by mouth daily.     Yes [provider]  ciprofloxacin (CIPRO) 500 MG tablet Take 1 tablet (500 mg total) by mouth 2 (two) times daily. Patient not taking: Reported on 01/23/2017 01/05/17   Governor Rooks, MD  nystatin  (MYCOSTATIN/NYSTOP) powder Apply topically 4 (four) times daily. Patient not taking: Reported on 01/23/2017 01/05/17   Governor Rooks, MD     Allergies Patient has no known allergies.   Family History  Problem Relation Age of Onset  . Family history unknown: Yes    Social History Social History  Substance Use Topics  . Smoking status: Never Smoker  . Smokeless tobacco: Former Neurosurgeon  . Alcohol use No    Review of Systems  Constitutional:   No fever or chills.  ENT:   No sore throat. No rhinorrhea. Cardiovascular:   No chest pain or syncope. Respiratory:   No dyspnea or cough. Gastrointestinal:   Negative for abdominal pain, vomiting and diarrhea.  Musculoskeletal:   positive as above right shoulder pain All other systems reviewed and are negative except as documented above in ROS and HPI.  ____________________________________________   PHYSICAL EXAM:  VITAL SIGNS: ED Triage Vitals  Enc Vitals Group     BP 01/23/17 1205 (!) 157/80     Pulse Rate 01/23/17 1205 (!) 123     Resp 01/23/17 1205 (!) 21     Temp 01/23/17 1205 98 F (36.7 C)     Temp Source 01/23/17 1205 Oral     SpO2 01/23/17 1205 97 %     Weight 01/23/17 1201 214 lb (97.1 kg)     Height 01/23/17 1201  (1.803 m)     Head Circumference --      Peak Flow --      Pain Score 01/23/17 1201 8     Pain Loc --      Pain Edu? --      Excl. in GC? --     Vital signs reviewed, nursing assessments reviewed.   Constitutional:   Alert and oriented. Well appearing and in no distress. Eyes:   No scleral icterus.  EOMI. No nystagmus. No conjunctival pallor. PERRL. ENT   Head:   Normocephalic and atraumatic.   Nose:   No congestion/rhinnorhea.    Mouth/Throat:   MMM, no pharyngeal erythema. No peritonsillar mass.    Neck:   No meningismus. Full ROM Hematological/Lymphatic/Immunilogical:   No cervical lymphadenopathy. Cardiovascular:   irregularly irregular rhythm, heart rate 100. Symmetric  bilateral radial and DP pulses.  No murmurs.  Respiratory:   Normal respiratory effort without tachypnea/retractions. Breath sounds are clear and equal bilaterally. No wheezes/rales/rhonchi. Gastrointestinal:   Soft and nontender. Non distended. There is no CVA tenderness.  No rebound, rigidity, or guarding. Genitourinary:   deferred Musculoskeletal:   Normal range of motion in all extremities. No joint effusions.  there is tenderness at the medial aspect of the right shoulder joint anteriorly over the musculature. This reproduces the pain. Pain is also  reproduced by having the patient adduct the right upper extremity across the body against resistance. Neurologic:   Normal speech and language.  Motor grossly intact. No gross focal neurologic deficits are appreciated.  Skin:    Skin is warm, dry and intact. No rash noted.  No petechiae, purpura, or bullae.  ____________________________________________    LABS (pertinent positives/negatives) (all labs ordered are listed, but only abnormal results are displayed) Labs Reviewed  CBC - Abnormal; Notable for the following:       Result Value   WBC 12.2 (*)    RBC 4.03 (*)    Hemoglobin 12.8 (*)    HCT 38.0 (*)    All other components within normal limits  COMPREHENSIVE METABOLIC PANEL - Abnormal; Notable for the following:    Glucose, Bld 158 (*)    BUN 24 (*)    Albumin 3.4 (*)    ALT 9 (*)    GFR calc non Af Amer 55 (*)    All other components within normal limits  TROPONIN I   ____________________________________________   EKG  interpreted by me Atrial fibrillation, rate of 1:30, left axis, left bundle branch block. No acute ischemic changes.  ____________________________________________    RADIOLOGY  Dg Chest 2 View  Result Date: 01/23/2017 CLINICAL DATA:  Painful breathing. EXAM: CHEST  2 VIEW COMPARISON:  Radiograph of November 29, 2016. FINDINGS: The heart size and mediastinal contours are within normal limits. No  pneumothorax or pleural effusion is noted. Atherosclerosis of thoracic aorta is noted. Right lung is clear. Stable mild left basilar scarring or subsegmental atelectasis is noted. The visualized skeletal structures are unremarkable. IMPRESSION: Aortic atherosclerosis. Stable mild left basilar scarring or subsegmental atelectasis. Electronically Signed   By: Lupita Raider, M.D.   On: 01/23/2017 12:45    ____________________________________________   PROCEDURES Procedures  ____________________________________________   INITIAL IMPRESSION / ASSESSMENT AND PLAN / ED COURSE  Pertinent labs & imaging results that were available during my care of the patient were reviewed by me and considered in my medical decision making (see chart for details).  patient presents with right shoulder pain, clearly musculoskeletal on exam. No exertional symptoms, well-appearing. Vital signs are unremarkable except for his atrial fibrillation. On my exam, heart rate is actually controlled.  blood pressure stable.Considering the patient's symptoms, medical history, and physical examination today, I have low suspicion for ACS, PE, TAD, pneumothorax, carditis, mediastinitis, pneumonia, CHF, or sepsis.        ____________________________________________   FINAL CLINICAL IMPRESSION(S) / ED DIAGNOSES  Final diagnoses:  Chronic right shoulder pain  Atrial fibrillation, unspecified type [facility]      New Prescriptions   No medications on file     Portions of this note were generated with dragon dictation software. Dictation errors may occur despite best attempts at proofreading.    Sharman Cheek, MD 01/23/17 [phone]

## 2017-01-23 NOTE — Telephone Encounter (Signed)
Received incoming call from patient and wife. Patient started having pain in his right chest and arm last night. It seems to occur when he breathes in a deep breath. Denies nausea, vomiting, sweating, left arm pain, jaw pain or dizziness. BP/HR normal per patient but could not provide readings. Patient applied a heating pad and that has seemed to help. Advised I will discuss with Dr End and give them a call back shortly as to whether patient should go to the ER. Advised patient to call PCP to see about getting an appointment.

## 2017-02-03 ENCOUNTER — Other Ambulatory Visit: Payer: Self-pay | Admitting: Cardiovascular Disease

## 2017-02-11 NOTE — Progress Notes (Signed)
Cardiology Office Note  Date:  02/12/2017   ID:  Mark Dougherty, DOB [birth year], MRN [id number]  PCP:  [name], MD   Chief Complaint  Patient presents with  . other    2 month follow up. Patient deneis chest pain and SOB. Patient states his right leg swelling but noting new. Meds reviewed verbally with patient.     HPI:  83 year old with history of  coronary artery disease,  bypass surgery in 1996 in [place],  peripheral vascular disease with CEA on the left in 2000 on the left,  long history of diabetes,  severe chronic back pain with MRI showing spinal stenosis,  hyperlipidemia,  back surgery 4 to 5 years ago with complications including atrial fibrillation in the perioperative, postoperative period started on amiodarone with conversion to sinus rhythm, with a repeat admission to [facility] for dehydration, hypotension and malaise.  hospital admission February 2016 for failure to thrive, dehydration. Previous EKG showing atrial flutter, underwent cardioversion which was successful in restoring normal sinus rhythm echocardiogram 2011 showing ejection fraction 40% He presents for routine followup of his coronary artery disease and syncope  Episode of syncope July  2018 UTI Carotid ultrasound less than 49% proximal right carotid disease, no significant stenosis on the left, Vertebral arteries patent Echocardiogram ejection fraction 55-60%, mild aortic valve stenosis Recent event monitor with one run of nonsustained VT 9 beats, no symptoms  On his visit today he reports feeling well Walking with his walker Denies any significant leg swelling or shortness of breath, PND orthopnea  Carvedilol dose previously decreased for hypotension  EKG personally reviewed by myself on today's visit shows normal sinus rhythm with rate 82 bpm, poor R-wave progression to the anterior precordial leads, left anterior fascicular block  Other past medical history reviewed  creatinine 1.3,  normal LFTs, total cholesterol 135, LDL 64, hemoglobin A1c 6.4  Total cholesterol August 2015 was 130  hospitalization in June 19th 2015 for weakness, cough, bronchitis, fall with left radial fracture. Noted to have low TSH, elevated free T4. Amiodarone was held. Also with mild renal insufficiency, elevated sugars. He was treated with antibiotics with improvement of his symptoms.  Previous episodes of COPD exacerbation in the hospital, he did improve with antibiotics and steroid taper  he uses a cane. History of falls in the past . Total cholesterol 153 last year.   Notes indicate an ejection fraction of 40% in March of 2011 by echocardiogram.  MRI of his back shows severe spinal stenosis at L2 and L3, other foraminal stenoses of the lumbar region.  PMH:   has a past medical history of A-fib (HCC); Atrial flutter (HCC); BPH (benign prostatic hyperplasia); CAD (coronary artery disease); Carotid artery stenosis; Chronic systolic CHF (congestive heart failure) (HCC); Diabetes mellitus; Hyperlipidemia; Hypotension; Ischemic cardiomyopathy; Left wrist fracture; Peripheral neuropathy; Spinal stenosis; and Thyroid disease.  PSH:    Past Surgical History:  Procedure Laterality Date  . BACK SURGERY  1975  . CAROTID ENDARTERECTOMY     2010  . CORONARY ARTERY BYPASS GRAFT  1996   x3  . ELECTROPHYSIOLOGIC STUDY N/A 06/29/2015   Procedure: Cardioversion;  Surgeon: Antonieta Iba, MD;  Location: [facility] ORS;  Service: Cardiovascular;  Laterality: N/A;  . EYE SURGERY  2013  . REPLACEMENT TOTAL KNEE     left  . TOOTH EXTRACTION      Current Outpatient Prescriptions  Medication Sig Dispense Refill  . apixaban (ELIQUIS) 5 MG TABS tablet Take 1 tablet (5  mg total) by mouth 2 (two) times daily. 180 tablet 5  . carvedilol (COREG) 3.125 MG tablet Take 3.125 mg by mouth daily.    . ciprofloxacin (CIPRO) 500 MG tablet Take 1 tablet (500 mg total) by mouth 2 (two) times daily. 14 tablet 0  .  finasteride (PROSCAR) 5 MG tablet Take 5 mg by mouth daily.    . folic acid (FOLVITE) 1 MG tablet Take 1 mg by mouth daily.      Marland Kitchen HYDROcodone-acetaminophen (NORCO/VICODIN) 5-325 MG tablet Take 1 tablet by mouth every 6 (six) hours as needed for moderate pain. 60 tablet 0  . magnesium oxide (MAG-OX) 400 MG tablet Take 1 tablet (400 mg total) by mouth daily. 30 tablet 6  . metFORMIN (GLUCOPHAGE) 500 MG tablet Take 500 mg by mouth 2 (two) times daily with a meal.      . nystatin (MYCOSTATIN/NYSTOP) powder Apply topically 4 (four) times daily. 15 g 0  . simvastatin (ZOCOR) 20 MG tablet TAKE ONE TABLET BY MOUTH EVERY NIGHT AT BEDTIME 30 tablet 3  . terazosin (HYTRIN) 1 MG capsule Take 1 mg by mouth at bedtime.      . vitamin B-12 (CYANOCOBALAMIN) 250 MCG tablet Take 250 mcg by mouth daily.       No current facility-administered medications for this visit.      Allergies:   Patient has no known allergies.   Social History:  The patient  reports that he has never smoked. He has quit using smokeless tobacco. He reports that he does not drink alcohol or use drugs.   Family History:   Family history is unknown by patient.    Review of Systems: Review of Systems  Constitutional: Negative.   Respiratory: Negative.   Cardiovascular: Negative.   Gastrointestinal: Negative.   Musculoskeletal: Positive for joint pain.        walks with a walker Leg weakness  Neurological: Negative.        Balance disorder  Psychiatric/Behavioral: Negative.   All other systems reviewed and are negative.    PHYSICAL EXAM: VS:  BP 128/68 (BP Location: Right Arm, Patient Position: Sitting, Cuff Size: Normal)   Pulse 82   Ht  (1.803 m)   Wt 211 lb (95.7 kg)   BMI 29.43 kg/m  , BMI Body mass index is 29.43 kg/m.  GEN: Well nourished, well developed, in no acute distress , presents today in a wheelchair HEENT: normal  Neck: no JVD, carotid bruits, or masses Cardiac: RRR; no murmurs, rubs, or gallops,  trace pitting edema right lower extremity to the mid shin Respiratory:  clear to auscultation bilaterally, normal work of breathing GI: soft, nontender, nondistended, + BS MS: no deformity or atrophy . Knee brace in place on the right Skin: warm and dry, no rash Neuro:  Strength and sensation are intact Psych: euthymic mood, full affect    Recent Labs: 11/30/2016: TSH 2.918 01/23/2017: ALT 9; BUN 24; Creatinine, Ser 1.15; Hemoglobin 12.8; Platelets 264; Potassium 4.2; Sodium 136    Lipid Panel No results found for: CHOL, HDL, LDLCALC, TRIG    Wt Readings from Last 3 Encounters:  02/12/17 211 lb (95.7 kg)  01/23/17 214 lb (97.1 kg)  01/05/17 216 lb (98 kg)       ASSESSMENT AND PLAN:   Mixed hyperlipidemia - Plan: EKG 12-Lead Cholesterol is at goal on the current lipid regimen. No changes to the medications were made. stable  Coronary artery disease involving native coronary artery of  native heart without angina pectoris - Plan: EKG 12-Lead Currently with no symptoms of angina. No further workup at this time. Continue current medication regimen. stable  Bilateral carotid artery stenosis -  , No bruits on exam Less than 39% stenosis bilaterally , o additional testing needed  Paroxysmal atrial fibrillation (HCC) - Plan: EKG 12-Lead Maintaining normal sinus rhythm, continue anticoagulation and low-dose beta blocker  Typical atrial flutter (HCC) - Plan: EKG 12-Lead Feels well since his cardioversion, no recurrence of his arrhythmia  Chronic systolic CHF (congestive heart failure) (HCC) - Plan: EKG 12-Lead Appears relatively euvolemic, currently not on Lasix  unable to advance his cardiac regimen given low blood pressure  Controlled type 2 diabetes mellitus with complication, without long-term current use of insulin (HCC) - Plan: EKG 12-Lead Hemoglobin A1c stable, ife helps with his diet  Chronic right knee pain/balance disorder Recommended he start using rubber bands,  ankle weights to strengthen his legs  Nonsustained VT on event monitor Discussed with him in detail, he does not want additional medications Discussed with his wife, recommended if he has any symptoms that he call our office  Ental status changes July 2018 Previous episode of mental status changes possibly from urinary tract infection He has had significant workup as detailed above   Total encounter time more than 25 minutes  Greater than 50% was spent in counseling and coordination of care with the patient  Disposition:   F/U  6 months   Orders Placed This Encounter  Procedures  . EKG 12-Lead     Signed, Dossie Arbour, M.D., Ph.D. 02/12/2017  [facility] [place], [place] [phone]

## 2017-02-12 ENCOUNTER — Encounter: Payer: Self-pay | Admitting: Cardiovascular Disease

## 2017-02-12 ENCOUNTER — Ambulatory Visit (INDEPENDENT_AMBULATORY_CARE_PROVIDER_SITE_OTHER): Payer: Medicare Other | Admitting: Cardiovascular Disease

## 2017-02-12 VITALS — BP 128/68 | HR 82 | Ht 71.0 in | Wt 211.0 lb

## 2017-02-12 DIAGNOSIS — Z23 Encounter for immunization: Secondary | ICD-10-CM | POA: Diagnosis not present

## 2017-02-12 DIAGNOSIS — I6523 Occlusion and stenosis of bilateral carotid arteries: Secondary | ICD-10-CM | POA: Diagnosis not present

## 2017-02-12 DIAGNOSIS — I483 Typical atrial flutter: Secondary | ICD-10-CM | POA: Diagnosis not present

## 2017-02-12 DIAGNOSIS — I5022 Chronic systolic (congestive) heart failure: Secondary | ICD-10-CM

## 2017-02-12 DIAGNOSIS — I480 Paroxysmal atrial fibrillation: Secondary | ICD-10-CM | POA: Diagnosis not present

## 2017-02-12 DIAGNOSIS — E118 Type 2 diabetes mellitus with unspecified complications: Secondary | ICD-10-CM | POA: Diagnosis not present

## 2017-02-12 DIAGNOSIS — I420 Dilated cardiomyopathy: Secondary | ICD-10-CM | POA: Diagnosis not present

## 2017-02-12 DIAGNOSIS — I25118 Atherosclerotic heart disease of native coronary artery with other forms of angina pectoris: Secondary | ICD-10-CM

## 2017-02-12 DIAGNOSIS — I209 Angina pectoris, unspecified: Secondary | ICD-10-CM

## 2017-02-12 DIAGNOSIS — E782 Mixed hyperlipidemia: Secondary | ICD-10-CM | POA: Diagnosis not present

## 2017-02-12 NOTE — Patient Instructions (Signed)

## 2017-03-18 ENCOUNTER — Ambulatory Visit (INDEPENDENT_AMBULATORY_CARE_PROVIDER_SITE_OTHER): Payer: Medicare Other

## 2017-03-18 ENCOUNTER — Ambulatory Visit (INDEPENDENT_AMBULATORY_CARE_PROVIDER_SITE_OTHER): Payer: Medicare Other | Admitting: Orthopaedic Surgery

## 2017-03-18 ENCOUNTER — Encounter (INDEPENDENT_AMBULATORY_CARE_PROVIDER_SITE_OTHER): Payer: Self-pay | Admitting: Orthopaedic Surgery

## 2017-03-18 DIAGNOSIS — I6523 Occlusion and stenosis of bilateral carotid arteries: Secondary | ICD-10-CM

## 2017-03-18 DIAGNOSIS — G8929 Other chronic pain: Secondary | ICD-10-CM | POA: Diagnosis not present

## 2017-03-18 DIAGNOSIS — M1711 Unilateral primary osteoarthritis, right knee: Secondary | ICD-10-CM | POA: Diagnosis not present

## 2017-03-18 DIAGNOSIS — M25561 Pain in right knee: Secondary | ICD-10-CM

## 2017-03-18 MED ORDER — HYDROCODONE-ACETAMINOPHEN 5-325 MG PO TABS: 1.0000 | ORAL_TABLET | Freq: Four times a day (QID) | ORAL | 0 refills | Status: DC | PRN

## 2017-03-18 NOTE — Progress Notes (Signed)
Office Visit Note   Patient: Mark Dougherty           Date of Birth: April 06, 1927           MRN: [id number] Visit Date: 03/18/2017              Requested by: [name], MD [id number]   [place], [place] [address] PCP: [name], MD   Assessment & Plan: Visit Diagnoses:  1. Chronic pain of right knee   2. Unilateral primary osteoarthritis, right knee     Plan: Based on the swelling in his knee as well as his x-ray showing severe arthritis in his knee talked about his knee aspiration and steroid injection.  He understands the effect this could have on his blood glucose as well.  He knows to watch his blood glucose closely.  He will follow-up otherwise as needed.  All questions were encouraged and answered.  Follow-Up Instructions: Return if symptoms worsen or fail to improve.   Orders:  Orders Placed This Encounter  Procedures  . XR Knee 1-2 Views Right   Meds ordered this encounter  Medications  . HYDROcodone-acetaminophen (NORCO/VICODIN) 5-325 MG tablet    Sig: Take 1 tablet every 6 (six) hours as needed by mouth for moderate pain.    Dispense:  60 tablet    Refill:  0      Procedures: No procedures performed   Clinical Data: No additional findings.   Subjective: Chief Complaint  Patient presents with  . Right Knee - Follow-up  The patient comes in today with severe right knee pain.  This is flared up since weather changes about a week ago.  Is not been able to a lot of weight on his knee either.  He denies any acute injury.  He is a diabetic and has had injections in the past that did increase his blood sugars with a running under better control now.  His right knee has been swelling as well.  It is detrimental effect it is activity 11, Cardiolite, his mobility.  He currently denies any headache, chest pain, shortness of breath, fever, chills, nausea, vomiting  HPI  Review of Systems   Objective: Vital Signs: There were no vitals taken for this  visit.  Physical Exam He is alert and oriented x3 in no acute distress Ortho Exam His right knee is well located.  She has global tenderness and mild effusion.  He is able to extend his knee and flex it but it is painful to do so. Specialty Comments:  No specialty comments available.  Imaging: Xr Knee 1-2 Views Right  Result Date: 03/18/2017 X-rays of his right knee include AP and lateral show severe end-stage arthritis of the right knee for all 3 compartments.  There is bone-on-bone contact in the medial joint line.    PMFS History: Patient Active Problem List   Diagnosis Date Noted  . Syncope 11/29/2016  . Acute left-sided low back pain without sciatica 11/12/2016  . Chronic systolic CHF (congestive heart failure) (HCC)   . Ischemic cardiomyopathy   . Thyroid disease   . Typical atrial flutter (HCC)   . Atrial flutter (HCC) 05/31/2015  . Neuropathy 05/31/2015  . Atrial fibrillation (HCC) 01/04/2011  . Congestive dilated cardiomyopathy (HCC) 09/28/2010  . Dehydration 09/14/2010  . Diabetes mellitus type 2, controlled, with complications (HCC) 01/09/2010  . Hyperlipidemia 01/09/2010  . CAD (coronary artery disease) 01/09/2010  . Carotid stenosis 01/09/2010   Past  Medical History:  Diagnosis Date  . A-fib (HCC)    a. in perioperative state-->converted to NSR on amiodarone  . Atrial flutter (HCC)    a. s/p DCCV 06/29/2015  . BPH (benign prostatic hyperplasia)   . CAD (coronary artery disease)    a. s/p CABG in 1996 in [place]  . Carotid artery stenosis    a. s/p left-sided CEA 2000; b. 02/2016 Carotid U/S: <39% bilat dzs.  . Chronic systolic CHF (congestive heart failure) (HCC)    a. echo 2011 with EF 40%  . Diabetes mellitus   . Hyperlipidemia   . Hypotension    mild  . Ischemic cardiomyopathy    a. 2011 Echo: Ef 40%.  . Left wrist fracture   . Peripheral neuropathy   . Spinal stenosis   . Thyroid disease    a. low TSH and elevated free T4 in June 2015, amiodarone  was stopped at that time    Family History  Family history unknown: Yes    Past Surgical History:  Procedure Laterality Date  . BACK SURGERY  1975  . Cardioversion N/A 06/29/2015   Performed by Antonieta IbaGollan, Timothy J, MD at [facility]  . CAROTID ENDARTERECTOMY     2010  . CORONARY ARTERY BYPASS GRAFT  1996   x3  . EYE SURGERY  2013  . REPLACEMENT TOTAL KNEE     left  . TOOTH EXTRACTION     Social History   Occupational History  . Not on file  Tobacco Use  . Smoking status: Never Smoker  . Smokeless tobacco: Former Engineer, [employer]  Substance and Sexual Activity  . Alcohol use: No  . Drug use: No  . Sexual activity: Not Currently

## 2017-03-25 ENCOUNTER — Ambulatory Visit: Payer: Medicare Other | Admitting: Podiatry

## 2017-03-25 ENCOUNTER — Telehealth: Payer: Self-pay | Admitting: Cardiovascular Disease

## 2017-03-25 NOTE — Telephone Encounter (Signed)
Patient dropped office drug assistance forms to be signed and sent to address on envelope that is included and pre stamped.    Placed in nurse box

## 2017-03-27 NOTE — Telephone Encounter (Signed)
Completed physician information. Awaiting signature. Paperwork on [employer], Charity fundraiser, desk

## 2017-03-29 NOTE — Telephone Encounter (Signed)
Faxed completed eliquis patient assistance form to [employer]

## 2017-04-04 ENCOUNTER — Telehealth: Payer: Self-pay | Admitting: Physician Assistant

## 2017-04-04 ENCOUNTER — Other Ambulatory Visit: Payer: Self-pay

## 2017-04-04 DIAGNOSIS — I483 Typical atrial flutter: Secondary | ICD-10-CM

## 2017-04-04 DIAGNOSIS — I480 Paroxysmal atrial fibrillation: Secondary | ICD-10-CM

## 2017-04-04 MED ORDER — APIXABAN 5 MG PO TABS: 5.0000 mg | ORAL_TABLET | Freq: Two times a day (BID) | ORAL | 11 refills | Status: DC

## 2017-04-04 NOTE — Telephone Encounter (Signed)
Called [employer] and s/w [employer] who states a written Eliquis prescription needs to be faxed to [phone]. Prescription information was completed in [employer] patient assistance packet but [facility] states they need both. Prescription faxed as requested.

## 2017-04-04 NOTE — Telephone Encounter (Signed)
Calling regarding rx for Eliquis.

## 2017-05-15 ENCOUNTER — Other Ambulatory Visit: Payer: Self-pay | Admitting: *Deleted

## 2017-05-15 DIAGNOSIS — I6523 Occlusion and stenosis of bilateral carotid arteries: Secondary | ICD-10-CM

## 2017-08-10 NOTE — Progress Notes (Signed)
Cardiology Office Note  Date:  08/12/2017   ID:  Mark Dougherty, DOB April 22, 1927, MRN [id number]  PCP:  [name], MD   Chief Complaint  Patient presents with  . other    2 month follow up. Patient deneis chest pain and SOB. Patient states his right leg swelling but noting new. Meds reviewed verbally with patient.     HPI:  84 year old with history of  coronary artery in follow-up today reports that he is feeling well disease,  bypass surgery in 1996 in [place],  peripheral vascular disease with CEA on the left in 2000 on the left,  long history of diabetes,  severe chronic back pain with MRI showing spinal stenosis,  hyperlipidemia,  back surgery 4 to 5 years ago with complications including atrial fibrillation in the perioperative, postoperative period started on amiodarone with conversion to sinus rhythm, with a repeat admission to [facility] for dehydration, hypotension and malaise.  hospital admission February 2016 for failure to thrive, dehydration. Previous EKG showing atrial flutter, underwent cardioversion 3/2017which was successful in restoring normal sinus rhythm echocardiogram 2011 showing ejection fraction 40% He presents for routine followup of his coronary artery disease and syncope  In follow-up today reports that he is feeling well Denies any chest pain unsteady gait presenting with a walker Chronic baseline shortness of breath, uncertain if it is worse recently,  Very sedentary, unable to walk far  Weight has been stable No syncope or near syncope Blood pressure stable at home  EKG personally reviewed by myself on todays visit Shows atrial flutter ventricular rate 99 bpm  Episode of syncope July  2018 UTI Carotid ultrasound less than 49% proximal right carotid disease, no significant stenosis on the left, Vertebral arteries patent Echocardiogram ejection fraction 55-60%, mild aortic valve stenosis Recent event monitor with one run of nonsustained VT 9  beats, no symptoms  Carvedilol dose previously decreased for hypotension   creatinine 1.3, normal LFTs, total cholesterol 135, LDL 64, hemoglobin A1c 6.4  Total cholesterol August 2015 was 130  hospitalization in June 19th 2015 for weakness, cough, bronchitis, fall with left radial fracture. Noted to have low TSH, elevated free T4. Amiodarone was held. Also with mild renal insufficiency, elevated sugars. He was treated with antibiotics with improvement of his symptoms.  Previous episodes of COPD exacerbation in the hospital, he did improve with antibiotics and steroid taper  he uses a cane. History of falls in the past . Total cholesterol 153 last year.   Notes indicate an ejection fraction of 40% in March of 2011 by echocardiogram.  MRI of his back shows severe spinal stenosis at L2 and L3, other foraminal stenoses of the lumbar region.  PMH:   has a past medical history of A-fib (HCC), Atrial flutter (HCC), BPH (benign prostatic hyperplasia), CAD (coronary artery disease), Carotid artery stenosis, Chronic systolic CHF (congestive heart failure) (HCC), Diabetes mellitus, Hyperlipidemia, Hypotension, Ischemic cardiomyopathy, Left wrist fracture, Peripheral neuropathy, Spinal stenosis, and Thyroid disease.  PSH:    Past Surgical History:  Procedure Laterality Date  . BACK SURGERY  1975  . CAROTID ENDARTERECTOMY     2010  . CORONARY ARTERY BYPASS GRAFT  1996   x3  . ELECTROPHYSIOLOGIC STUDY N/A 06/29/2015   Procedure: Cardioversion;  Surgeon: Antonieta Iba, MD;  Location: [facility] ORS;  Service: Cardiovascular;  Laterality: N/A;  . EYE SURGERY  2013  . REPLACEMENT TOTAL KNEE     left  . TOOTH EXTRACTION  Current Outpatient Medications  Medication Sig Dispense Refill  . apixaban (ELIQUIS) 5 MG TABS tablet Take 1 tablet (5 mg total) by mouth 2 (two) times daily. 60 tablet 11  . carvedilol (COREG) 3.125 MG tablet Take 3.125 mg by mouth daily.    . finasteride (PROSCAR) 5 MG  tablet Take 5 mg by mouth daily.    . folic acid (FOLVITE) 1 MG tablet Take 1 mg by mouth daily.      Marland Kitchen HYDROcodone-acetaminophen (NORCO/VICODIN) 5-325 MG tablet Take 1 tablet every 6 (six) hours as needed by mouth for moderate pain. 60 tablet 0  . magnesium oxide (MAG-OX) 400 MG tablet Take 1 tablet (400 mg total) by mouth daily. 30 tablet 6  . metFORMIN (GLUCOPHAGE) 500 MG tablet Take 500 mg by mouth 2 (two) times daily with a meal.      . nystatin (MYCOSTATIN/NYSTOP) powder Apply topically 4 (four) times daily. 15 g 0  . simvastatin (ZOCOR) 20 MG tablet TAKE ONE TABLET BY MOUTH EVERY NIGHT AT BEDTIME 30 tablet 3  . vitamin B-12 (CYANOCOBALAMIN) 250 MCG tablet Take 250 mcg by mouth daily.       No current facility-administered medications for this visit.      Allergies:   Patient has no known allergies.   Social History:  The patient  reports that he has never smoked. He has quit using smokeless tobacco. He reports that he does not drink alcohol or use drugs.   Family History:   Family history is unknown by patient.    Review of Systems: Review of Systems  Constitutional: Negative.   Respiratory: Positive for shortness of breath.   Cardiovascular: Negative.   Gastrointestinal: Negative.   Musculoskeletal: Positive for joint pain.        walks with a walker Leg weakness  Neurological: Negative.        Balance disorder  Psychiatric/Behavioral: Negative.   All other systems reviewed and are negative.    PHYSICAL EXAM: VS:  BP 118/60 (BP Location: Right Arm, Patient Position: Sitting, Cuff Size: Normal)   Pulse 99   Ht 5\' 11"  (1.803 m)   Wt 215 lb (97.5 kg)   BMI 29.99 kg/m  , BMI Body mass index is 29.99 kg/m.  GEN: Well nourished, well developed, in no acute distress , presents today in a wheelchair HEENT: normal  Neck: no JVD, carotid bruits, or masses Cardiac: RRR; no murmurs, rubs, or gallops, trace pitting edema right lower extremity to the mid shin Respiratory:   clear to auscultation bilaterally, normal work of breathing GI: soft, nontender, nondistended, + BS MS: no deformity or atrophy . Knee brace in place on the right Skin: warm and dry, no rash Neuro:  Strength and sensation are intact Psych: euthymic mood, full affect    Recent Labs: 11/30/2016: TSH 2.918 01/23/2017: ALT 9; BUN 24; Creatinine, Ser 1.15; Hemoglobin 12.8; Platelets 264; Potassium 4.2; Sodium 136    Lipid Panel No results found for: CHOL, HDL, LDLCALC, TRIG    Wt Readings from Last 3 Encounters:  08/12/17 215 lb (97.5 kg)  02/12/17 211 lb (95.7 kg)  01/23/17 214 lb (97.1 kg)       ASSESSMENT AND PLAN:   Mixed hyperlipidemia - Plan: EKG 12-Lead Cholesterol is at goal on the current lipid regimen.  stable no medication changes made  Coronary artery disease involving native coronary artery of native heart without angina pectoris - Plan: EKG 12-Lead Currently with no symptoms of angina.  Continue current  medication regimen. Stable, no further testing  Bilateral carotid artery stenosis -  No bruits on exam Less than 49% disease bilaterally Repeat ultrasound August 2019  Paroxysmal atrial fibrillation (HCC) - Plan: EKG 12-Lead He is in atrial flutter on today's visit We will continue carvedilol at current dose given previous hypotension  Typical atrial flutter (HCC) - Plan: EKG 12-Lead Recurrence of his arrhythmia, documented on EKG Unable to determine if there is worsening shortness of breath Ventricular rate higher He is interested in restoring normal sinus rhythm given chronic breathing issues, does not want to make him worse We will do preprocedure lab work, schedule cardioversion Risk and benefit discussed with him today He is on anticoagulation and willing to proceed Last cardioversion 2 years ago  Chronic systolic CHF (congestive heart failure) (HCC) - Plan: EKG 12-Lead Appears relatively euvolemic, currently not on Lasix  unable to advance his  cardiac regimen given low blood pressure  Controlled type 2 diabetes mellitus with complication, without long-term current use of insulin (HCC) - Plan: EKG 12-Lead Hemoglobin A1c stable, wife helps with his diet No significant change in weight  Chronic right knee pain/balance disorder No regular exercise program, chronic right knee pain limiting ability to exercise  Nonsustained VT on event monitor Previously did not want additional medications recommended if he has any symptoms that he call our office    Total encounter time more than 45 minutes  Greater than 50% was spent in counseling and coordination of care with the patient  Disposition:   F/U  1 month   Orders Placed This Encounter  Procedures  . EKG 12-Lead     Signed, Dossie Arbour, M.D., Ph.D. 08/12/2017  [facility] [place], [place] [phone]

## 2017-08-12 ENCOUNTER — Ambulatory Visit (INDEPENDENT_AMBULATORY_CARE_PROVIDER_SITE_OTHER): Payer: Medicare Other | Admitting: Cardiovascular Disease

## 2017-08-12 ENCOUNTER — Encounter: Payer: Self-pay | Admitting: Cardiovascular Disease

## 2017-08-12 VITALS — BP 118/60 | HR 99 | Ht 71.0 in | Wt 215.0 lb

## 2017-08-12 DIAGNOSIS — I6523 Occlusion and stenosis of bilateral carotid arteries: Secondary | ICD-10-CM | POA: Diagnosis not present

## 2017-08-12 DIAGNOSIS — I25118 Atherosclerotic heart disease of native coronary artery with other forms of angina pectoris: Secondary | ICD-10-CM | POA: Diagnosis not present

## 2017-08-12 DIAGNOSIS — I483 Typical atrial flutter: Secondary | ICD-10-CM

## 2017-08-12 DIAGNOSIS — R55 Syncope and collapse: Secondary | ICD-10-CM | POA: Diagnosis not present

## 2017-08-12 DIAGNOSIS — E782 Mixed hyperlipidemia: Secondary | ICD-10-CM | POA: Diagnosis not present

## 2017-08-12 DIAGNOSIS — I480 Paroxysmal atrial fibrillation: Secondary | ICD-10-CM

## 2017-08-12 DIAGNOSIS — I420 Dilated cardiomyopathy: Secondary | ICD-10-CM

## 2017-08-12 DIAGNOSIS — E118 Type 2 diabetes mellitus with unspecified complications: Secondary | ICD-10-CM

## 2017-08-12 NOTE — Patient Instructions (Addendum)
Medication Instructions:   No medication changes made  Labwork:  Your physician recommends that you have lab work today: BMP/ CBC   Testing/Procedures:  We will schedule a cardioversion for atrial flutter  You are scheduled for a Cardioversion on Thursday 08/22/17 with Dr.Gollan. [facility] at [facility] at 7:00 a.m. on the day of your procedure.  DIET INSTRUCTIONS:  Nothing to eat or drink after midnight the night prior to your procedure  1) Labs: today  2) Medications:  YOU MAY TAKE ALL of your regular medications with a small amount of water the morning of your procedure- DO NOT miss any doses of Eliquis  3) Must have a responsible person to drive you home.  4) Bring a current list of your medications and current insurance cards.    If you have any questions after you get home, please call the office at [phone]    Follow-Up: It was a pleasure seeing you in the office today. Please call us if you have new issues that need to be addressed before your next appt.  [phone]  Your physician wants you to follow-up in: 1 month.    If you need a refill on your cardiac medications before your next appointment, please call your pharmacy.  For educational health videos Log in to : [email or web address] Or : [email or web address], password : [email or web address]

## 2017-08-13 LAB — CBC WITH DIFFERENTIAL/PLATELET
Basophils Absolute: 0 10*3/uL (ref 0.0–0.2)
Basos: 0 %
EOS (ABSOLUTE): 0.3 10*3/uL (ref 0.0–0.4)
EOS: 3 %
HEMATOCRIT: 39 % (ref 37.5–51.0)
HEMOGLOBIN: 12.7 g/dL — AB (ref 13.0–17.7)
Immature Grans (Abs): 0 10*3/uL (ref 0.0–0.1)
Immature Granulocytes: 0 %
LYMPHS ABS: 3.5 10*3/uL — AB (ref 0.7–3.1)
Lymphs: 46 %
MCH: 30.2 pg (ref 26.6–33.0)
MCHC: 32.6 g/dL (ref 31.5–35.7)
MCV: 93 fL (ref 79–97)
MONOCYTES: 11 %
Monocytes Absolute: 0.9 10*3/uL (ref 0.1–0.9)
NEUTROS ABS: 3 10*3/uL (ref 1.4–7.0)
Neutrophils: 40 %
Platelets: 278 10*3/uL (ref 150–379)
RBC: 4.2 x10E6/uL (ref 4.14–5.80)
RDW: 14.2 % (ref 12.3–15.4)
WBC: 7.6 10*3/uL (ref 3.4–10.8)

## 2017-08-13 LAB — BASIC METABOLIC PANEL
BUN / CREAT RATIO: 14 (ref 10–24)
BUN: 17 mg/dL (ref 8–27)
CO2: 21 mmol/L (ref 20–29)
CREATININE: 1.21 mg/dL (ref 0.76–1.27)
Calcium: 9.1 mg/dL (ref 8.6–10.2)
Chloride: 104 mmol/L (ref 96–106)
GFR calc Af Amer: 61 mL/min/{1.73_m2} (ref >59)
GFR calc non Af Amer: 53 mL/min/{1.73_m2} — ABNORMAL LOW (ref >59)
Glucose: 137 mg/dL — ABNORMAL HIGH (ref 65–99)
Potassium: 4.9 mmol/L (ref 3.5–5.2)
Sodium: 140 mmol/L (ref 134–144)

## 2017-08-21 ENCOUNTER — Other Ambulatory Visit: Payer: Self-pay | Admitting: Cardiovascular Disease

## 2017-08-21 DIAGNOSIS — I4819 Other persistent atrial fibrillation: Secondary | ICD-10-CM

## 2017-08-22 ENCOUNTER — Encounter
Admission: RE | Disposition: A | Discharge status: Home / Self Care | Payer: Self-pay | Source: Ambulatory Visit | Attending: Cardiovascular Disease

## 2017-08-22 ENCOUNTER — Ambulatory Visit: Payer: Medicare Other | Admitting: Anesthesiology

## 2017-08-22 ENCOUNTER — Other Ambulatory Visit: Payer: Self-pay | Admitting: Cardiovascular Disease

## 2017-08-22 ENCOUNTER — Ambulatory Visit
Admission: RE | Admit: 2017-08-22 | Discharge: 2017-08-22 | Disposition: A | Discharge status: Home / Self Care | Payer: Medicare Other | Source: Ambulatory Visit | Attending: Cardiovascular Disease | Admitting: Cardiovascular Disease

## 2017-08-22 DIAGNOSIS — I255 Ischemic cardiomyopathy: Secondary | ICD-10-CM | POA: Insufficient documentation

## 2017-08-22 DIAGNOSIS — M25561 Pain in right knee: Secondary | ICD-10-CM | POA: Diagnosis not present

## 2017-08-22 DIAGNOSIS — Z79899 Other long term (current) drug therapy: Secondary | ICD-10-CM | POA: Insufficient documentation

## 2017-08-22 DIAGNOSIS — G8929 Other chronic pain: Secondary | ICD-10-CM | POA: Insufficient documentation

## 2017-08-22 DIAGNOSIS — E1151 Type 2 diabetes mellitus with diabetic peripheral angiopathy without gangrene: Secondary | ICD-10-CM | POA: Diagnosis not present

## 2017-08-22 DIAGNOSIS — Z7984 Long term (current) use of oral hypoglycemic drugs: Secondary | ICD-10-CM | POA: Diagnosis not present

## 2017-08-22 DIAGNOSIS — I480 Paroxysmal atrial fibrillation: Secondary | ICD-10-CM | POA: Diagnosis not present

## 2017-08-22 DIAGNOSIS — I6523 Occlusion and stenosis of bilateral carotid arteries: Secondary | ICD-10-CM | POA: Diagnosis not present

## 2017-08-22 DIAGNOSIS — E1142 Type 2 diabetes mellitus with diabetic polyneuropathy: Secondary | ICD-10-CM | POA: Insufficient documentation

## 2017-08-22 DIAGNOSIS — R0602 Shortness of breath: Secondary | ICD-10-CM | POA: Diagnosis not present

## 2017-08-22 DIAGNOSIS — Z7901 Long term (current) use of anticoagulants: Secondary | ICD-10-CM | POA: Insufficient documentation

## 2017-08-22 DIAGNOSIS — I483 Typical atrial flutter: Secondary | ICD-10-CM | POA: Diagnosis not present

## 2017-08-22 DIAGNOSIS — I5022 Chronic systolic (congestive) heart failure: Secondary | ICD-10-CM | POA: Diagnosis not present

## 2017-08-22 DIAGNOSIS — E782 Mixed hyperlipidemia: Secondary | ICD-10-CM | POA: Diagnosis not present

## 2017-08-22 DIAGNOSIS — Z951 Presence of aortocoronary bypass graft: Secondary | ICD-10-CM | POA: Insufficient documentation

## 2017-08-22 DIAGNOSIS — I2510 Atherosclerotic heart disease of native coronary artery without angina pectoris: Secondary | ICD-10-CM | POA: Insufficient documentation

## 2017-08-22 HISTORY — PX: CARDIOVERSION: EP1203

## 2017-08-22 LAB — GLUCOSE, CAPILLARY: GLUCOSE-CAPILLARY: 136 mg/dL — AB (ref 65–99)

## 2017-08-22 SURGERY — CARDIOVERSION (CATH LAB)
Anesthesia: General

## 2017-08-22 MED ORDER — SODIUM CHLORIDE 0.9 % IV SOLN
INTRAVENOUS | Status: DC | PRN
  Administered 2017-08-22: 07:00:00 via INTRAVENOUS

## 2017-08-22 MED ORDER — PROPOFOL 10 MG/ML IV BOLUS
INTRAVENOUS | Status: AC
  Filled 2017-08-22: qty 20

## 2017-08-22 MED ORDER — PROPOFOL 10 MG/ML IV BOLUS
INTRAVENOUS | Status: DC | PRN
  Administered 2017-08-22: 30 mg via INTRAVENOUS

## 2017-08-22 NOTE — CV Procedure (Signed)
Cardioversion procedure note For atrial flutter, typical.  Procedure Details:  Consent: Risks of procedure as well as the alternatives and risks of each were explained to the (patient/caregiver). Consent for procedure obtained.  Time Out: Verified patient identification, verified procedure, site/side was marked, verified correct patient position, special equipment/implants available, medications/allergies/relevent history reviewed, required imaging and test results available. Performed  Patient placed on cardiac monitor, pulse oximetry, supplemental oxygen as necessary.  Sedation given: propofol IV, Dr. Kephart Pacer pads placed anterior and posterior chest.   Cardioverted 1 time(s).  Cardioverted at  150 J. Synchronized biphasic Converted to NSR   Evaluation: Findings: Post procedure EKG shows: NSR Complications: None Patient did tolerate procedure well.  Time Spent Directly with the Patient:  45 minutes   Tim Bron Snellings, M.D., Ph.D. 

## 2017-08-22 NOTE — H&P (Signed)
H&P Addendum, pre-cardioversion  Patient was seen and evaluated prior to -cardioversion procedure Symptoms, prior testing details again confirmed with the patient Patient examined, no significant change from prior exam Lab work reviewed in detail personally by myself Patient understands risk and benefit of the procedure, willing to proceed  Signed, Tim Gollan, MD, Ph.D [facility]  

## 2017-08-22 NOTE — Transfer of Care (Signed)
Immediate Anesthesia Transfer of Care Note  Patient: Mark Dougherty  Procedure(s) Performed: CARDIOVERSION (N/A )  Patient Location: PACU  Anesthesia Type:General  Level of Consciousness: awake and alert   Airway & Oxygen Therapy: Patient Spontanous Breathing and Patient connected to nasal cannula oxygen  Post-op Assessment: Report given to RN and Post -op Vital signs reviewed and stable  Post vital signs: Reviewed and stable  Last Vitals:  Vitals Value Taken Time  BP 126/66 08/22/2017  7:34 AM  Temp    Pulse 79 08/22/2017  7:35 AM  Resp 21 08/22/2017  7:35 AM  SpO2 98 % 08/22/2017  7:35 AM  Vitals shown include unvalidated device data.  Last Pain:  Vitals:   08/22/17 0705  TempSrc: Oral  PainSc: 0-No pain         Complications: No apparent anesthesia complications

## 2017-08-22 NOTE — Anesthesia Procedure Notes (Signed)
Date/Time: 08/22/2017 7:17 AM Performed by: Ginger Carne, CRNA Pre-anesthesia Checklist: Patient identified, Emergency Drugs available, Suction available, Patient being monitored and Timeout performed Patient Re-evaluated:Patient Re-evaluated prior to induction Oxygen Delivery Method: Nasal cannula Preoxygenation: Pre-oxygenation with 100% oxygen

## 2017-08-22 NOTE — Anesthesia Postprocedure Evaluation (Signed)
Anesthesia Post Note  Patient: Mark Dougherty  Procedure(s) Performed: CARDIOVERSION (N/A )  Patient location during evaluation: Other Anesthesia Type: General Level of consciousness: awake and alert Pain management: pain level controlled Vital Signs Assessment: post-procedure vital signs reviewed and stable Respiratory status: spontaneous breathing and respiratory function stable Cardiovascular status: stable Anesthetic complications: no     Last Vitals:  Vitals:   08/22/17 0734 08/22/17 0737  BP:  127/74  Pulse: 78 77  Resp: (!) 22 (!) 21  Temp:    SpO2: 100% 98%    Last Pain:  Vitals:   08/22/17 0705  TempSrc: Oral  PainSc: 0-No pain                 Masen Salvas K

## 2017-08-22 NOTE — Anesthesia Post-op Follow-up Note (Signed)
Anesthesia QCDR form completed.        

## 2017-08-22 NOTE — Anesthesia Preprocedure Evaluation (Addendum)
Anesthesia Evaluation  Patient identified by MRN, date of birth, ID band Patient awake    Reviewed: Allergy & Precautions, NPO status , Patient's Chart, lab work & pertinent test results  History of Anesthesia Complications Negative for: history of anesthetic complications  Airway Mallampati: II       Dental  (+) Upper Dentures, Poor Dentition, Chipped   Pulmonary neg sleep apnea, neg COPD,           Cardiovascular (-) hypertension+ CAD, + CABG, + Peripheral Vascular Disease and +CHF  + dysrhythmias Atrial Fibrillation (-) Valvular Problems/Murmurs     Neuro/Psych neg Seizures    GI/Hepatic Neg liver ROS, neg GERD  ,  Endo/Other  diabetes, Type 2, Oral Hypoglycemic Agents  Renal/GU negative Renal ROS     Musculoskeletal   Abdominal   Peds  Hematology   Anesthesia Other Findings   Reproductive/Obstetrics                            Anesthesia Physical Anesthesia Plan  ASA: III  Anesthesia Plan: General   Post-op Pain Management:    Induction: Intravenous  PONV Risk Score and Plan: 2 and TIVA and Propofol infusion  Airway Management Planned: Nasal Cannula  Additional Equipment:   Intra-op Plan:   Post-operative Plan:   Informed Consent: I have reviewed the patients History and Physical, chart, labs and discussed the procedure including the risks, benefits and alternatives for the proposed anesthesia with the patient or authorized representative who has indicated his/her understanding and acceptance.     Plan Discussed with:   Anesthesia Plan Comments:         Anesthesia Quick Evaluation

## 2017-08-23 ENCOUNTER — Encounter: Payer: Self-pay | Admitting: Cardiovascular Disease

## 2017-09-11 NOTE — Progress Notes (Signed)
Cardiology Office Note  Date:  09/12/2017   ID:  Mark Dougherty, DOB 03/15/1927, MRN [id number]  PCP:  [name], MD   Chief Complaint  Patient presents with  . other    2 month follow up. Patient deneis chest pain and SOB. Patient states his right leg swelling but noting new. Meds reviewed verbally with patient.     HPI:  84 year old with history of  coronary artery in follow-up today reports that he is feeling well disease,  bypass surgery in 1996 in [place],  peripheral vascular disease with CEA on the left in 2000 on the left,  long history of diabetes, no severe chronic back pain with MRI showing spinal stenosis,  hyperlipidemia,  back surgery 4 to 5 years ago with complications including atrial fibrillation in the perioperative, postoperative period started on amiodarone with conversion to sinus rhythm, with a repeat admission to [facility] for dehydration, hypotension and malaise.  hospital admission February 2016 for failure to thrive, dehydration. Previous EKG showing atrial flutter, underwent cardioversion 06/2015 which was successful in restoring normal sinus rhythm echocardiogram 2011 showing ejection fraction 40% He presents for routine followup of his coronary artery disease, syncope and atrial flutter  On his last clinic visit he had atrial flutter, prior episode 2 years earlier Cardioversion, 4/25/ 2019 Normal sinus rhythm restored Feels well on today's visit  reports at baseline he does not walk, legs are very weak Does not do any regular exercise No chest pain  He does not check his blood pressure at home Weight stable, no orthostasis symptoms  EKG personally reviewed by myself on todays visit Shows nsr rate 89 bpm  Episode of syncope July  2018 UTI Carotid ultrasound less than 49% proximal right carotid disease, no significant stenosis on the left, Vertebral arteries patent Echocardiogram ejection fraction 55-60%, mild aortic valve stenosis Recent  event monitor with one run of nonsustained VT 9 beats, no symptoms  Carvedilol dose previously decreased for hypotension   creatinine 1.3, normal LFTs, total cholesterol 135, LDL 64, hemoglobin A1c 6.4  Total cholesterol August 2015 was 130  hospitalization in June 19th 2015 for weakness, cough, bronchitis, fall with left radial fracture. Noted to have low TSH, elevated free T4. Amiodarone was held. Also with mild renal insufficiency, elevated sugars. He was treated with antibiotics with improvement of his symptoms.  Previous episodes of COPD exacerbation in the hospital, he did improve with antibiotics and steroid taper  he uses a cane. History of falls in the past . Total cholesterol 153 last year.   Notes indicate an ejection fraction of 40% in March of 2011 by echocardiogram.  MRI of his back shows severe spinal stenosis at L2 and L3, other foraminal stenoses of the lumbar region.  PMH:   has a past medical history of A-fib (HCC), Atrial flutter (HCC), BPH (benign prostatic hyperplasia), CAD (coronary artery disease), Carotid artery stenosis, Chronic systolic CHF (congestive heart failure) (HCC), Diabetes mellitus, Hyperlipidemia, Hypotension, Ischemic cardiomyopathy, Left wrist fracture, Peripheral neuropathy, Spinal stenosis, and Thyroid disease.  PSH:    Past Surgical History:  Procedure Laterality Date  . BACK SURGERY  1975  . CARDIOVERSION N/A 08/22/2017   Procedure: CARDIOVERSION;  Surgeon: Antonieta Iba, MD;  Location: [facility] ORS;  Service: Cardiovascular;  Laterality: N/A;  . CAROTID ENDARTERECTOMY     2010  . CORONARY ARTERY BYPASS GRAFT  1996   x3  . ELECTROPHYSIOLOGIC STUDY N/A 06/29/2015   Procedure: Cardioversion;  Surgeon: Antonieta Iba,  MD;  Location: [facility] ORS;  Service: Cardiovascular;  Laterality: N/A;  . EYE SURGERY  2013  . REPLACEMENT TOTAL KNEE     left  . TOOTH EXTRACTION      Current Outpatient Medications  Medication Sig Dispense Refill  .  apixaban (ELIQUIS) 5 MG TABS tablet Take 1 tablet (5 mg total) by mouth 2 (two) times daily. 60 tablet 11  . Cyanocobalamin (VITAMIN B-12 PO) Take 1 tablet by mouth daily.     . finasteride (PROSCAR) 5 MG tablet Take 5 mg by mouth daily.    . folic acid (FOLVITE) 1 MG tablet Take 1 mg by mouth daily.      Marland Kitchen HYDROcodone-acetaminophen (NORCO/VICODIN) 5-325 MG tablet Take 1 tablet every 6 (six) hours as needed by mouth for moderate pain. (Patient taking differently: Take 1 tablet by mouth daily as needed for moderate pain. ) 60 tablet 0  . magnesium oxide (MAG-OX) 400 MG tablet Take 1 tablet (400 mg total) by mouth daily. 30 tablet 6  . metFORMIN (GLUCOPHAGE) 500 MG tablet Take 500 mg by mouth 2 (two) times daily with a meal.      . Multiple Vitamins-Minerals (CENTRUM PO) Take 1 tablet by mouth daily.    Marland Kitchen nystatin (MYCOSTATIN/NYSTOP) powder Apply topically 4 (four) times daily. (Patient taking differently: Apply 1 g topically 4 (four) times daily as needed (for rash). ) 15 g 0  . simvastatin (ZOCOR) 20 MG tablet TAKE ONE TABLET BY MOUTH EVERY NIGHT AT BEDTIME (Patient taking differently: TAKE 20 MG BY MOUTH EVERY NIGHT AT BEDTIME) 30 tablet 3  . terazosin (HYTRIN) 1 MG capsule Take 1 mg by mouth at bedtime.    . Tetrahyd-Glyc-Hypro-PEG-ZnSulf (VISINE TOTALITY MULTI-SYMPTOM OP) Place 2 drops into both eyes daily as needed (for dry eyes).     No current facility-administered medications for this visit.      Allergies:   Patient has no known allergies.   Social History:  The patient  reports that he has never smoked. He has quit using smokeless tobacco. He reports that he does not drink alcohol or use drugs.   Family History:   Family history is unknown by patient.    Review of Systems: Review of Systems  Constitutional: Negative.   Respiratory: Positive for shortness of breath.   Cardiovascular: Negative.   Gastrointestinal: Negative.   Musculoskeletal: Positive for joint pain.         walks with a walker Leg weakness  Neurological: Negative.        Balance disorder  Psychiatric/Behavioral: Negative.   All other systems reviewed and are negative.    PHYSICAL EXAM: VS:  BP 103/61 (BP Location: Right Arm, Patient Position: Sitting, Cuff Size: Normal)   Pulse 89   Ht 5\' 11"  (1.803 m)   Wt 215 lb 8 oz (97.8 kg)   BMI 30.06 kg/m  , BMI Body mass index is 30.06 kg/m.  GEN: Well nourished, well developed, in no acute distress , presents today in a wheelchair HEENT: normal  Neck: no JVD, carotid bruits, or masses Cardiac: RRR; no murmurs, rubs, or gallops, trace pitting edema right lower extremity to the mid shin Respiratory:  clear to auscultation bilaterally, normal work of breathing GI: soft, nontender, nondistended, + BS MS: no deformity or atrophy . Knee brace in place on the right Skin: warm and dry, no rash Neuro:  Strength and sensation are intact Psych: euthymic mood, full affect    Recent Labs: 11/30/2016: TSH 2.918 01/23/2017:  ALT 9 08/12/2017: BUN 17; Creatinine, Ser 1.21; Hemoglobin 12.7; Platelets 278; Potassium 4.9; Sodium 140    Lipid Panel No results found for: CHOL, HDL, LDLCALC, TRIG    Wt Readings from Last 3 Encounters:  09/12/17 215 lb 8 oz (97.8 kg)  08/22/17 215 lb (97.5 kg)  08/12/17 215 lb (97.5 kg)       ASSESSMENT AND PLAN:   Mixed hyperlipidemia - Plan: EKG 12-Lead Continue current medications Goal LDL less than 70  Coronary artery disease involving native coronary artery of native heart without angina pectoris -  Currently with no symptoms of angina.  Continue current medication regimen. Stable, no further testing  Bilateral carotid artery stenosis -  No bruits on exam Less than 49% disease bilaterally Repeat ultrasound August 2019  Paroxysmal atrial fibrillation (HCC) - Plan: EKG 12-Lead No recent episodes of atrial fibrillation  Typical atrial flutter (HCC) - Plan: EKG 12-Lead Successful cardioversion, normal  sinus rhythm restored No room to add additional medications given low blood pressure  Chronic systolic CHF (congestive heart failure) (HCC) - Plan: EKG 12-Lead Appears relatively euvolemic, currently not on Lasix  unable to advance his cardiac regimen given low blood pressure Blood pressure still running low  Controlled type 2 diabetes mellitus with complication, without long-term current use of insulin (HCC) - Plan: EKG 12-Lead Hemoglobin A1c stable, wife helps with his diet No significant change in weight Hemoglobin A1c typically in the 6 range  Chronic right knee pain/balance disorder No regular exercise program, chronic right knee pain limiting ability to exercise Legs are very weak bilaterally He does not want surgery  Nonsustained VT on event monitor Previously did not want additional medications recommended if he has any symptoms that he call our office Denies any symptoms concerning for nonsustained VT    Total encounter time more than 25 minutes  Greater than 50% was spent in counseling and coordination of care with the patient  Disposition:   F/U  12 month   Orders Placed This Encounter  Procedures  . EKG 12-Lead     Signed, Dossie Arbour, M.D., Ph.D. 09/12/2017  [facility] [place], [place] [phone]

## 2017-09-12 ENCOUNTER — Ambulatory Visit (INDEPENDENT_AMBULATORY_CARE_PROVIDER_SITE_OTHER): Payer: Medicare Other | Admitting: Cardiovascular Disease

## 2017-09-12 ENCOUNTER — Encounter: Payer: Self-pay | Admitting: Cardiovascular Disease

## 2017-09-12 VITALS — BP 103/61 | HR 89 | Ht 71.0 in | Wt 215.5 lb

## 2017-09-12 DIAGNOSIS — I25118 Atherosclerotic heart disease of native coronary artery with other forms of angina pectoris: Secondary | ICD-10-CM | POA: Diagnosis not present

## 2017-09-12 DIAGNOSIS — I255 Ischemic cardiomyopathy: Secondary | ICD-10-CM | POA: Diagnosis not present

## 2017-09-12 DIAGNOSIS — I5022 Chronic systolic (congestive) heart failure: Secondary | ICD-10-CM | POA: Diagnosis not present

## 2017-09-12 DIAGNOSIS — I480 Paroxysmal atrial fibrillation: Secondary | ICD-10-CM

## 2017-09-12 DIAGNOSIS — I6523 Occlusion and stenosis of bilateral carotid arteries: Secondary | ICD-10-CM

## 2017-09-12 DIAGNOSIS — E118 Type 2 diabetes mellitus with unspecified complications: Secondary | ICD-10-CM | POA: Diagnosis not present

## 2017-09-12 NOTE — Patient Instructions (Signed)

## 2017-10-07 ENCOUNTER — Ambulatory Visit: Payer: Medicare Other | Admitting: Podiatry

## 2017-10-10 ENCOUNTER — Encounter: Payer: Self-pay | Admitting: Podiatry

## 2017-10-10 ENCOUNTER — Ambulatory Visit (INDEPENDENT_AMBULATORY_CARE_PROVIDER_SITE_OTHER): Payer: Medicare Other | Admitting: Podiatry

## 2017-10-10 DIAGNOSIS — M79676 Pain in unspecified toe(s): Secondary | ICD-10-CM

## 2017-10-10 DIAGNOSIS — E118 Type 2 diabetes mellitus with unspecified complications: Secondary | ICD-10-CM

## 2017-10-10 DIAGNOSIS — B351 Tinea unguium: Secondary | ICD-10-CM | POA: Diagnosis not present

## 2017-10-10 DIAGNOSIS — D689 Coagulation defect, unspecified: Secondary | ICD-10-CM

## 2017-10-10 NOTE — Progress Notes (Signed)
Complaint:  Visit Type: Patient returns to my office for continued preventative foot care services. Complaint: Patient states" my nails have grown long and thick and become painful to walk and wear shoes" Patient has been diagnosed with DM with no foot complications. The patient presents for preventative foot care services. No changes to ROS  Podiatric Exam: Vascular: dorsalis pedis and posterior tibial pulses are palpable bilateral. Capillary return is immediate. Temperature gradient is WNL. Skin turgor WNL  Sensorium: Normal Semmes Weinstein monofilament test. Normal tactile sensation bilaterally. Nail Exam: Pt has thick disfigured discolored nails with subungual debris noted bilateral entire nail hallux through fifth toenails Ulcer Exam: There is no evidence of ulcer or pre-ulcerative changes or infection. Orthopedic Exam: Muscle tone and strength are WNL. No limitations in general ROM. No crepitus or effusions noted. Foot type and digits show no abnormalities.  Mild HAV  B/L Skin: No Porokeratosis. No infection or ulcers  Diagnosis:  Onychomycosis, , Pain in right toe, pain in left toes  Treatment & Plan Procedures and Treatment: Consent by patient was obtained for treatment procedures. The patient understood the discussion of treatment and procedures well. All questions were answered thoroughly reviewed. Debridement of mycotic and hypertrophic toenails, 1 through 5 bilateral and clearing of subungual debris. No ulceration, no infection noted.   Return Visit-Office Procedure: Patient instructed to return to the office for a follow up visit 3 months for continued evaluation and treatment.    Helane Gunther DPM

## 2017-11-28 ENCOUNTER — Ambulatory Visit (INDEPENDENT_AMBULATORY_CARE_PROVIDER_SITE_OTHER): Payer: Medicare Other | Admitting: Orthopaedic Surgery

## 2017-11-28 ENCOUNTER — Ambulatory Visit (INDEPENDENT_AMBULATORY_CARE_PROVIDER_SITE_OTHER): Payer: Self-pay

## 2017-11-28 ENCOUNTER — Encounter (INDEPENDENT_AMBULATORY_CARE_PROVIDER_SITE_OTHER): Payer: Self-pay | Admitting: Orthopaedic Surgery

## 2017-11-28 DIAGNOSIS — M1712 Unilateral primary osteoarthritis, left knee: Secondary | ICD-10-CM | POA: Diagnosis not present

## 2017-11-28 DIAGNOSIS — M25562 Pain in left knee: Secondary | ICD-10-CM | POA: Diagnosis not present

## 2017-11-28 DIAGNOSIS — M1711 Unilateral primary osteoarthritis, right knee: Secondary | ICD-10-CM

## 2017-11-28 DIAGNOSIS — I255 Ischemic cardiomyopathy: Secondary | ICD-10-CM | POA: Diagnosis not present

## 2017-11-28 MED ORDER — LIDOCAINE HCL 1 % IJ SOLN
3.0000 mL | INTRAMUSCULAR | Status: AC | PRN
  Administered 2017-11-28: 3 mL

## 2017-11-28 MED ORDER — METHYLPREDNISOLONE ACETATE 40 MG/ML IJ SUSP
40.0000 mg | INTRAMUSCULAR | Status: AC | PRN
  Administered 2017-11-28: 40 mg via INTRA_ARTICULAR

## 2017-11-28 MED ORDER — HYDROCODONE-ACETAMINOPHEN 5-325 MG PO TABS: 1.0000 | ORAL_TABLET | Freq: Four times a day (QID) | ORAL | 0 refills | Status: DC | PRN

## 2017-11-28 NOTE — Progress Notes (Signed)
Office Visit Note   Patient: Mark Dougherty           Date of Birth: 1926/12/03           MRN: [id number] Visit Date: 11/28/2017              Requested by: [name], MD [address]   [place], [place] [address] PCP: [name], MD   Assessment & Plan: Visit Diagnoses:  1. Left knee pain, unspecified chronicity   2. Unilateral primary osteoarthritis, right knee   3. Unilateral primary osteoarthritis, left knee     Plan: I gave him reassurance that this is mainly just severe arthritis in his knees and then occasional steroid injection may help him and he is agreeable to this.  He will try knee sleeves as well.  I did send in a prescription for hydrocodone but he knows to use this sparingly.  Follow-up will be as needed.  Follow-Up Instructions: Return if symptoms worsen or fail to improve.   Orders:  Orders Placed This Encounter  Procedures  . Large Joint Inj  . XR Knee 1-2 Views Left   Meds ordered this encounter  Medications  . HYDROcodone-acetaminophen (NORCO/VICODIN) 5-325 MG tablet    Sig: Take 1 tablet by mouth every 6 (six) hours as needed for moderate pain.    Dispense:  40 tablet    Refill:  0      Procedures: Large Joint Inj: L knee on 11/28/2017 3:26 PM Indications: diagnostic evaluation and pain Details: 22 G 1.5 in needle, superolateral approach  Arthrogram: No  Medications: 3 mL lidocaine 1 %; 40 mg methylPREDNISolone acetate 40 MG/ML Outcome: tolerated well, no immediate complications Procedure, treatment alternatives, risks and benefits explained, specific risks discussed. Consent was given by the patient. Immediately prior to procedure a time out was called to verify the correct patient, procedure, equipment, support staff and site/side marked as required. Patient was prepped and draped in the usual sterile fashion.       Clinical Data: No additional findings.   Subjective: Chief Complaint  Patient presents with  . Left Knee -  Pain  The patient is coming in today with complaint of left knee pain with popping.  He mainly gets around in a wheelchair in a lift chair.  He is 85 years old and will be 89 and 5 days.  He has been popping a lot as well.  He has had steroid injections in the past.  He does take  chronic hydrocodone on occasion.  He is in a wheelchair today.  He said the pain is not severe but it is disconcerting to him.  HPI  Review of Systems He is alert and oriented no acute distress.  Denies any current chest pain or shortness of breath.  Denies any fever and chills.  Objective: Vital Signs: There were no vitals taken for this visit.  Physical Exam Is alert and oriented Ortho Exam Examination of his right knee shows patellofemoral crepitation.  Examination of his left knee shows patellofemoral crepitation.  There is a mild effusion bilaterally and he has slight flexion contractures bilaterally but no acute findings.  His calf is soft bilaterally. Specialty Comments:  No specialty comments available.  Imaging: Xr Knee 1-2 Views Left  Result Date: 11/28/2017 2 views of the left knee show severe tricompartmental arthritic changes and with no acute findings otherwise.    PMFS History: Patient Active Problem List   Diagnosis Date Noted  .  Unilateral primary osteoarthritis, left knee 11/28/2017  . Unilateral primary osteoarthritis, right knee 11/28/2017  . Syncope 11/29/2016  . Acute left-sided low back pain without sciatica 11/12/2016  . Chronic systolic CHF (congestive heart failure) (HCC)   . Ischemic cardiomyopathy   . Thyroid disease   . Typical atrial flutter (HCC)   . Atrial flutter (HCC) 05/31/2015  . Neuropathy 05/31/2015  . Atrial fibrillation (HCC) 01/04/2011  . Congestive dilated cardiomyopathy (HCC) 09/28/2010  . Dehydration 09/14/2010  . Diabetes mellitus type 2, controlled, with complications (HCC) 01/09/2010  . Hyperlipidemia 01/09/2010  . CAD (coronary artery disease)  01/09/2010  . Carotid stenosis 01/09/2010   Past Medical History:  Diagnosis Date  . A-fib (HCC)    a. in perioperative state-->converted to NSR on amiodarone  . Atrial flutter (HCC)    a. s/p DCCV 06/29/2015  . BPH (benign prostatic hyperplasia)   . CAD (coronary artery disease)    a. s/p CABG in 1996 in [place]  . Carotid artery stenosis    a. s/p left-sided CEA 2000; b. 02/2016 Carotid U/S: <39% bilat dzs.  . Chronic systolic CHF (congestive heart failure) (HCC)    a. echo 2011 with EF 40%  . Diabetes mellitus   . Hyperlipidemia   . Hypotension    mild  . Ischemic cardiomyopathy    a. 2011 Echo: Ef 40%.  . Left wrist fracture   . Peripheral neuropathy   . Spinal stenosis   . Thyroid disease    a. low TSH and elevated free T4 in June 2015, amiodarone was stopped at that time    Family History  Family history unknown: Yes    Past Surgical History:  Procedure Laterality Date  . BACK SURGERY  1975  . CARDIOVERSION N/A 08/22/2017   Procedure: CARDIOVERSION;  Surgeon: Antonieta Iba, MD;  Location: [facility] ORS;  Service: Cardiovascular;  Laterality: N/A;  . CAROTID ENDARTERECTOMY     2010  . CORONARY ARTERY BYPASS GRAFT  1996   x3  . ELECTROPHYSIOLOGIC STUDY N/A 06/29/2015   Procedure: Cardioversion;  Surgeon: Antonieta Iba, MD;  Location: [facility] ORS;  Service: Cardiovascular;  Laterality: N/A;  . EYE SURGERY  2013  . REPLACEMENT TOTAL KNEE     left  . TOOTH EXTRACTION     Social History   Occupational History  . Not on file  Tobacco Use  . Smoking status: Never Smoker  . Smokeless tobacco: Former Engineer, water and Sexual Activity  . Alcohol use: No  . Drug use: No  . Sexual activity: Not Currently

## 2017-12-05 ENCOUNTER — Ambulatory Visit (INDEPENDENT_AMBULATORY_CARE_PROVIDER_SITE_OTHER): Payer: Medicare Other

## 2017-12-05 DIAGNOSIS — I6523 Occlusion and stenosis of bilateral carotid arteries: Secondary | ICD-10-CM | POA: Diagnosis not present

## 2018-01-16 ENCOUNTER — Ambulatory Visit: Payer: Medicare Other | Admitting: Podiatry

## 2018-01-27 ENCOUNTER — Encounter (INDEPENDENT_AMBULATORY_CARE_PROVIDER_SITE_OTHER): Payer: Self-pay | Admitting: Orthopaedic Surgery

## 2018-01-27 ENCOUNTER — Ambulatory Visit (INDEPENDENT_AMBULATORY_CARE_PROVIDER_SITE_OTHER): Payer: Medicare Other | Admitting: Orthopaedic Surgery

## 2018-01-27 DIAGNOSIS — Z9181 History of falling: Secondary | ICD-10-CM

## 2018-01-27 DIAGNOSIS — Z7409 Other reduced mobility: Secondary | ICD-10-CM | POA: Diagnosis not present

## 2018-01-27 DIAGNOSIS — I255 Ischemic cardiomyopathy: Secondary | ICD-10-CM

## 2018-01-27 NOTE — Progress Notes (Addendum)
The patient comes in today for evaluation for a power mobility device.  He is 84 years old.  I seen him for a long period of time.  He is a significant fall risk.  He is at risk of injuring himself due to such poor mobility.  His main medical problems from orthopedic standpoint include osteoarthritis of both his legs which is quite severe.  He also has peripheral neuropathy.  Other medical issues include diabetes and congestive heart failure.  He has spinal stenosis as well.  At this point he is at such a fall risk a power mobility device is necessary to ensure his safety.  He does not ambulate much at all and his gait is shuffling.  He has severe limitations in his mobility.  The patient is currently 5 feet 11 inches tall and weighs 215 pounds at today's visit.  On exam he has limited strength of his bilateral upper extremities.  He does not reach overhead well.  He has significant weakness of at least 4 out of 5 out of his shoulders.  He has significant limitations in his grip and pinch strength with only about 2-3 out of 5 on his pinch and grip bilaterally.  Both legs show significant weakness with neuropathy as well as limitations in range of motion of both knees due to severe arthritic changes in the knees. Both of his legs are only 3+ out of 5 strength bilaterally.  Apparent mobility device is necessary for him in his own house to perform activities daily living such as going to the bathroom, going to the kitchen, and even going to the bedroom.  His house can accommodate a power mobility device.  He cannot accommodate a manual wheelchair due to it being a cooperative home.  Also I do not feel comfortable with him using a manual wheelchair due to his significant bilateral upper extremity weakness and grip and power overall.  This keeps him from being able to use a manual wheelchair.  I do feel that he could benefit from a power mobility device.  He does not have good postural stability for a regular  scooter.  Given the significant fall risk in his upper extremity weakness a cane or walker is not appropriate.  I do feel the patient is willing and motivated to use a power mobility device in his home can accommodate this safely.  He does have the cognitive and physical ability to use a power mobility device.

## 2018-01-28 ENCOUNTER — Telehealth (INDEPENDENT_AMBULATORY_CARE_PROVIDER_SITE_OTHER): Payer: Self-pay | Admitting: Orthopaedic Surgery

## 2018-01-28 NOTE — Telephone Encounter (Signed)
01/27/2018 ov note faxed to [facility] [phone]

## 2018-02-11 ENCOUNTER — Telehealth (INDEPENDENT_AMBULATORY_CARE_PROVIDER_SITE_OTHER): Payer: Self-pay | Admitting: Orthopaedic Surgery

## 2018-02-11 NOTE — Telephone Encounter (Addendum)
Mark Dougherty  [phone] Fax  [phone]  Call Back  Ref # [id number]     Patient was last seen on the 30th of September for high risk injury related to fall. Paperwork was faxed to the office that is handling patients  Mark Dougherty. Mark Dougherty stated she sent the addendum to the office will need what was changed to be underlined signed and dated. This information will be provided to the insurance Dougherty.

## 2018-02-13 NOTE — Telephone Encounter (Signed)
Re-faxed.

## 2018-03-05 ENCOUNTER — Telehealth (INDEPENDENT_AMBULATORY_CARE_PROVIDER_SITE_OTHER): Payer: Self-pay | Admitting: Orthopaedic Surgery

## 2018-03-05 NOTE — Telephone Encounter (Signed)
See below

## 2018-03-05 NOTE — Telephone Encounter (Signed)
Patient left a voicemail wanting to let Dr. Magnus Ivan know he received a hoover round chair and that he is so appreciative. FYI !

## 2018-04-11 ENCOUNTER — Telehealth: Payer: Self-pay | Admitting: Cardiovascular Disease

## 2018-04-11 NOTE — Telephone Encounter (Signed)
Forms completed and provided back to patient. He is very hard of hearing. Tried to review that he would need to also submit tax documents, expense report from pharmacy, or social security statements. Provided him with signed forms and instructed him to please call if any questions. He was appreciative for the time with no other questions.

## 2018-04-11 NOTE — Telephone Encounter (Signed)
Patient dropped off forms for medication assistance . Pam completed has signed and gave back to patient .

## 2018-05-13 NOTE — Telephone Encounter (Signed)
Pt has been approved For Eliquis Patient assistance program.  05/10/2018-04/30/2019

## 2018-06-23 ENCOUNTER — Ambulatory Visit: Payer: Medicare Other | Admitting: Podiatry

## 2018-08-21 ENCOUNTER — Encounter: Payer: Self-pay | Admitting: Emergency Medicine

## 2018-08-21 ENCOUNTER — Emergency Department: Payer: Medicare Other

## 2018-08-21 ENCOUNTER — Emergency Department
Admission: EM | Admit: 2018-08-21 | Discharge: 2018-08-21 | Disposition: A | Discharge status: Home / Self Care | Payer: Medicare Other | Attending: Emergency Medicine | Admitting: Emergency Medicine

## 2018-08-21 DIAGNOSIS — M1812 Unilateral primary osteoarthritis of first carpometacarpal joint, left hand: Secondary | ICD-10-CM | POA: Diagnosis not present

## 2018-08-21 DIAGNOSIS — M25532 Pain in left wrist: Secondary | ICD-10-CM | POA: Diagnosis present

## 2018-08-21 DIAGNOSIS — E119 Type 2 diabetes mellitus without complications: Secondary | ICD-10-CM | POA: Diagnosis not present

## 2018-08-21 DIAGNOSIS — I4891 Unspecified atrial fibrillation: Secondary | ICD-10-CM | POA: Diagnosis not present

## 2018-08-21 DIAGNOSIS — E785 Hyperlipidemia, unspecified: Secondary | ICD-10-CM | POA: Insufficient documentation

## 2018-08-21 DIAGNOSIS — I2510 Atherosclerotic heart disease of native coronary artery without angina pectoris: Secondary | ICD-10-CM | POA: Diagnosis not present

## 2018-08-21 MED ORDER — DICLOFENAC SODIUM 1 % TD GEL: 4.0000 g | Freq: Four times a day (QID) | TRANSDERMAL | 1 refills | Status: DC

## 2018-08-21 MED ORDER — HYDROCODONE-ACETAMINOPHEN 5-325 MG PO TABS: 1.0000 | ORAL_TABLET | Freq: Four times a day (QID) | ORAL | 0 refills | Status: DC | PRN

## 2018-08-21 MED ORDER — OXYCODONE-ACETAMINOPHEN 5-325 MG PO TABS
1.0000 | ORAL_TABLET | Freq: Once | ORAL | Status: AC
  Administered 2018-08-21: 10:00:00 1 via ORAL
  Filled 2018-08-21: qty 1

## 2018-08-21 NOTE — ED Triage Notes (Signed)
Presents via EMS from home   States he developed left wrist pain about 5 days ago w/o injury  States pain became worse today  Min swelling  And increased tenderness with palpation

## 2018-08-21 NOTE — Discharge Instructions (Signed)
Please follow up with Dr. Arlana Pouch or Dr. Allena Katz if not improving over the next week.  Wear the brace day and night until the pain is better or until you follow up with primary care or orthopedics.  Return to the ER if the pain gets worse and you are unable to schedule an appointment.

## 2018-08-21 NOTE — ED Provider Notes (Signed)
[facility] Emergency Department Provider Note ____________________________________________  Time seen: Approximately 8:56 AM  I have reviewed the triage vital signs and the nursing notes.   HISTORY  Chief Complaint Wrist Pain    HPI Mark Dougherty is a 85 y.o. male who presents to the emergency department for evaluation and treatment of nontraumatic left wrist pain.  Patient states he went to sleep on Saturday night and woke up Sunday morning with excruciating left wrist pain.  He denies falling or injury.  States that he has been taking "a pain pill my doctor gave me."  He has had no relief.  He has had a left wrist fracture in the past.  He denies any chest pain, shortness of breath, or other symptoms of concern.  Past Medical History:  Diagnosis Date  . A-fib (HCC)    a. in perioperative state-->converted to NSR on amiodarone  . Atrial flutter (HCC)    a. s/p DCCV 06/29/2015  . BPH (benign prostatic hyperplasia)   . CAD (coronary artery disease)    a. s/p CABG in 1996 in [place]  . Carotid artery stenosis    a. s/p left-sided CEA 2000; b. 02/2016 Carotid U/S: <39% bilat dzs.  . Chronic systolic CHF (congestive heart failure) (HCC)    a. echo 2011 with EF 40%  . Diabetes mellitus   . Hyperlipidemia   . Hypotension    mild  . Ischemic cardiomyopathy    a. 2011 Echo: Ef 40%.  . Left wrist fracture   . Peripheral neuropathy   . Spinal stenosis   . Thyroid disease    a. low TSH and elevated free T4 in June 2015, amiodarone was stopped at that time    Patient Active Problem List   Diagnosis Date Noted  . Unilateral primary osteoarthritis, left knee 11/28/2017  . Unilateral primary osteoarthritis, right knee 11/28/2017  . Syncope 11/29/2016  . Acute left-sided low back pain without sciatica 11/12/2016  . Chronic systolic CHF (congestive heart failure) (HCC)   . Ischemic cardiomyopathy   . Thyroid disease   . Typical atrial flutter (HCC)   . Atrial  flutter (HCC) 05/31/2015  . Neuropathy 05/31/2015  . Atrial fibrillation (HCC) 01/04/2011  . Congestive dilated cardiomyopathy (HCC) 09/28/2010  . Dehydration 09/14/2010  . Diabetes mellitus type 2, controlled, with complications (HCC) 01/09/2010  . Hyperlipidemia 01/09/2010  . CAD (coronary artery disease) 01/09/2010  . Carotid stenosis 01/09/2010    Past Surgical History:  Procedure Laterality Date  . BACK SURGERY  1975  . CARDIOVERSION N/A 08/22/2017   Procedure: CARDIOVERSION;  Surgeon: Gollan, Timothy J, MD;  Location: [facility] ORS;  Service: Cardiovascular;  Laterality: N/A;  . CAROTID ENDARTERECTOMY     20 10  . CORONARY ARTERY BYPASS GRAFT  1996   x3  . ELECTROPHYSIOLOGIC STUDY N/A 06/29/2015   Procedure: Cardioversion;  Surgeon: Antonieta Iba, MD;  Location: [facility] ORS;  Service: Cardiovascular;  Laterality: N/A;  . EYE SURGERY  2013  . REPLACEMENT TOTAL KNEE     left  . TOOTH EXTRACTION      Prior to Admission medications   Medication Sig Start Date End Date Taking? Authorizing Provider  apixaban (ELIQUIS) 5 MG TABS tablet Take 1 tablet (5 mg total) by mouth 2 (two) times daily. 04/04/17   Dunn, Raymon Mutton, PA-C  Cyanocobalamin (VITAMIN B-12 PO) Take 1 tablet by mouth daily.     [provider]  diclofenac sodium (VOLTAREN) 1 % GEL Apply 4 g  topically 4 (four) times daily. 08/21/18   Aryel Edelen B, FNP  finasteride (PROSCAR) 5 MG tablet Take 5 mg by mouth daily.    [provider]  folic acid (FOLVITE) 1 MG tablet Take 1 mg by mouth daily.      [provider]  HYDROcodone-acetaminophen (NORCO/VICODIN) 5-325 MG tablet Take 1 tablet by mouth every 6 (six) hours as needed for moderate pain. 08/21/18   Heidie Krall B, FNP  magnesium oxide (MAG-OX) 400 MG tablet Take 1 tablet (400 mg total) by mouth daily. 01/03/17   Dunn, Raymon Mutton, PA-C  metFORMIN (GLUCOPHAGE) 500 MG tablet Take 500 mg by mouth 2 (two) times daily with a meal.      [provider]   Multiple Vitamins-Minerals (CENTRUM PO) Take 1 tablet by mouth daily.    [provider]  nystatin (MYCOSTATIN/NYSTOP) powder Apply topically 4 (four) times daily. Patient taking differently: Apply 1 g topically 4 (four) times daily as needed (for rash).  01/05/17   Governor Rooks, MD  simvastatin (ZOCOR) 20 MG tablet TAKE ONE TABLET BY MOUTH EVERY NIGHT AT BEDTIME Patient taking differently: TAKE 20 MG BY MOUTH EVERY NIGHT AT BEDTIME 02/04/17   Antonieta Iba, MD  terazosin (HYTRIN) 1 MG capsule Take 1 mg by mouth at bedtime.    [provider]  Tetrahyd-Glyc-Hypro-PEG-ZnSulf (VISINE TOTALITY MULTI-SYMPTOM OP) Place 2 drops into both eyes daily as needed (for dry eyes).    [provider]    Allergies Patient has no known allergies.  Family History  Family history unknown: Yes    Social History Social History   Tobacco Use  . Smoking status: Never Smoker  . Smokeless tobacco: Former Engineer, water Use Topics  . Alcohol use: No  . Drug use: No    Review of Systems Constitutional: Negative for fever. Cardiovascular: Negative for chest pain. Respiratory: Negative for shortness of breath. Musculoskeletal: Positive for left wrist pain. Skin: No open wounds or lesions. Mild erythema over the dorsal aspect of the left wrist. Neurological: Negative for decrease in sensation  ____________________________________________   PHYSICAL EXAM:  VITAL SIGNS: ED Triage Vitals  Enc Vitals Group     BP 08/21/18 0853 (!) 148/80     Pulse Rate 08/21/18 0853 94     Resp 08/21/18 0853 18     Temp 08/21/18 0853 (!) 97.4 F (36.3 C)     Temp Source 08/21/18 0853 Oral     SpO2 08/21/18 0853 98 %     Weight 08/21/18 0854 214 lb (97.1 kg)     Height 08/21/18 0854  (1.803 m)     Head Circumference --      Peak Flow --      Pain Score 08/21/18 0854 10     Pain Loc --      Pain Edu? --      Excl. in GC? --     Constitutional: Alert and oriented. Well  appearing and in no acute distress. Eyes: Conjunctivae are clear without discharge or drainage Head: Atraumatic Neck: Supple Respiratory: No cough. Respirations are even and unlabored. Musculoskeletal: Limited movement of the left wrist due to pain. FROM of the fingers of the left hand.  Neurologic: Motor and sensory function is intact.  Skin: No open wounds or lesions.   Psychiatric: Affect and behavior are appropriate.  ____________________________________________   LABS (all labs ordered are listed, but only abnormal results are displayed)  Labs Reviewed - No data to  display ____________________________________________  RADIOLOGY  Severe osteoarthritis of first carpometacarpal. Mild degenerative change seen involving radiocarpal joint. No definate fracture or dislocation is noted.  ____________________________________________   PROCEDURES  Procedures  ____________________________________________   INITIAL IMPRESSION / ASSESSMENT AND PLAN / ED COURSE  Mark Dougherty is a 85 y.o. who presents to the emergency department for treatment and evaluation of left wrist pain. Image shows significant osteoarthritis.  Velcro wrist splint was applied.  Patient reported that it provided some relief right away.  Based on medication review, it does appear that his primary care provider had prescribed him some Norco within the recent months.  Patient states that he does pretty well with it and denies any dizziness or loss of balance when taking it.  For, it seems reasonable due to the significant amount of pain that he be given a prescription for Norco today.  Patient instructed to follow-up with orthopedics if not improving with Voltaren and Norco..  He was also instructed to return to the emergency department for symptoms that change or worsen if unable schedule an appointment with orthopedics or primary care.  Medications  oxyCODONE-acetaminophen (PERCOCET/ROXICET) 5-325 MG per tablet 1  tablet (1 tablet Oral Given 08/21/18 0931)    Pertinent labs & imaging results that were available during my care of the patient were reviewed by me and considered in my medical decision making (see chart for details).  _________________________________________   FINAL CLINICAL IMPRESSION(S) / ED DIAGNOSES  Final diagnoses:  Osteoarthritis of carpometacarpal joint of left thumb, unspecified osteoarthritis type    ED Discharge Orders         Ordered    diclofenac sodium (VOLTAREN) 1 % GEL  4 times daily     08/21/18 0937    HYDROcodone-acetaminophen (NORCO/VICODIN) 5-325 MG tablet  Every 6 hours PRN     08/21/18 0937           If controlled substance prescribed during this visit, 12 month history viewed on the [facility] prior to issuing an initial prescription for Schedule II or III opiod.   Chinita Pester, FNP 08/21/18 1526    Jeanmarie Plant, MD 08/21/18 1535

## 2018-10-02 ENCOUNTER — Encounter: Payer: Self-pay | Admitting: Orthopaedic Surgery

## 2018-10-02 ENCOUNTER — Other Ambulatory Visit: Payer: Self-pay

## 2018-10-02 ENCOUNTER — Ambulatory Visit (INDEPENDENT_AMBULATORY_CARE_PROVIDER_SITE_OTHER): Payer: Medicare Other | Admitting: Orthopaedic Surgery

## 2018-10-02 DIAGNOSIS — G8929 Other chronic pain: Secondary | ICD-10-CM

## 2018-10-02 DIAGNOSIS — M25532 Pain in left wrist: Secondary | ICD-10-CM | POA: Diagnosis not present

## 2018-10-02 DIAGNOSIS — M25511 Pain in right shoulder: Secondary | ICD-10-CM

## 2018-10-02 MED ORDER — METHYLPREDNISOLONE ACETATE 40 MG/ML IJ SUSP
40.0000 mg | INTRAMUSCULAR | Status: AC | PRN
  Administered 2018-10-02: 16:00:00 40 mg via INTRA_ARTICULAR

## 2018-10-02 MED ORDER — HYDROCODONE-ACETAMINOPHEN 5-325 MG PO TABS: 1.0000 | ORAL_TABLET | Freq: Four times a day (QID) | ORAL | 0 refills | Status: DC | PRN

## 2018-10-02 MED ORDER — LIDOCAINE HCL 1 % IJ SOLN
3.0000 mL | INTRAMUSCULAR | Status: AC | PRN
  Administered 2018-10-02: 3 mL

## 2018-10-02 MED ORDER — LIDOCAINE HCL 1 % IJ SOLN
1.0000 mL | INTRAMUSCULAR | Status: AC | PRN
  Administered 2018-10-02: 1 mL

## 2018-10-02 NOTE — Progress Notes (Signed)
Office Visit Note   Patient: Mark Dougherty           Date of Birth: 04-16-1927           MRN: [id number] Visit Date: 10/02/2018              Requested by: [name], MD [address]   [place], [place] [address] PCP: [name], MD   Assessment & Plan: Visit Diagnoses: No diagnosis found.  Plan: I was able to successfully place a steroid injection in the subacromial space of his right shoulder as well as at the left wrist joint.  He tolerated these well.  I did send in some more hydrocodone for him to try to use sparingly.  We will try a Velcro wrist splint just for stability of his left wrist as he uses his wheelchair.  Follow-up will be as needed.  All question concerns were answered and addressed.  Follow-Up Instructions: Return if symptoms worsen or fail to improve.   Orders:  No orders of the defined types were placed in this encounter.  No orders of the defined types were placed in this encounter.     Procedures: Large Joint Inj: R subacromial bursa on 10/02/2018 3:35 PM Indications: pain and diagnostic evaluation Details: 22 G 1.5 in needle  Arthrogram: No  Medications: 3 mL lidocaine 1 %; 40 mg methylPREDNISolone acetate 40 MG/ML Outcome: tolerated well, no immediate complications Procedure, treatment alternatives, risks and benefits explained, specific risks discussed. Consent was given by the patient. Immediately prior to procedure a time out was called to verify the correct patient, procedure, equipment, support staff and site/side marked as required. Patient was prepped and draped in the usual sterile fashion.   Medium Joint Inj: L radiocarpal on 10/02/2018 3:35 PM Medications: 1 mL lidocaine 1 %      Clinical Data: No additional findings.   Subjective: Chief Complaint  Patient presents with  . Right Shoulder - Pain  The patient is a 85 year old gentleman well-known to [place].  He is mainly wheelchair-bound but he does get around some.  With that  being said he has known arthritis in his left wrist and issues with his right shoulder.  He would like to have injections in both of these today.  His left wrist was painful enough that he went to the emergency room for x-rays.  X-rays showed profound arthritis in the left wrist joint.  He has had x-rays of his right shoulder before and has had injections before.  We have had him on some hydrocodone which are trying to wean him from.  HPI  Review of Systems He currently denies any headache, chest pain, short of breath, fever, chills, nausea, vomiting  Objective: Vital Signs: There were no vitals taken for this visit.  Physical Exam He is alert and orient x3 and in no acute distress Ortho Exam Examination of his right shoulder shows rotator cuff deficiency with severe impingement and weakness.  Examination of his left wrist shows limitations in range of motion with dorsal wrist pain.  There is a lot of pain with flexion extension of the wrist. Specialty Comments:  No specialty comments available.  Imaging: No results found. Independent review of x-rays of his left wrist did not show any severe arthritis of the wrist joint itself.  There was a lot of arthritis of the first carpometacarpal joint and I would describe mild arthritis of the wrist joint itself.  PMFS History: Patient Active  Problem List   Diagnosis Date Noted  . Unilateral primary osteoarthritis, left knee 11/28/2017  . Unilateral primary osteoarthritis, right knee 11/28/2017  . Syncope 11/29/2016  . Acute left-sided low back pain without sciatica 11/12/2016  . Chronic systolic CHF (congestive heart failure) (HCC)   . Ischemic cardiomyopathy   . Thyroid disease   . Typical atrial flutter (HCC)   . Atrial flutter (HCC) 05/31/2015  . Neuropathy 05/31/2015  . Atrial fibrillation (HCC) 01/04/2011  . Congestive dilated cardiomyopathy (HCC) 09/28/2010  . Dehydration 09/14/2010  . Diabetes mellitus type 2, controlled, with  complications (HCC) 01/09/2010  . Hyperlipidemia 01/09/2010  . CAD (coronary artery disease) 01/09/2010  . Carotid stenosis 01/09/2010   Past Medical History:  Diagnosis Date  . A-fib (HCC)    a. in perioperative state-->converted to NSR on amiodarone  . Atrial flutter (HCC)    a. s/p DCCV 06/29/2015  . BPH (benign prostatic hyperplasia)   . CAD (coronary artery disease)    a. s/p CABG in 1996 in [place]  . Carotid artery stenosis    a. s/p left-sided CEA 2000; b. 02/2016 Carotid U/S: <39% bilat dzs.  . Chronic systolic CHF (congestive heart failure) (HCC)    a. echo 2011 with EF 40%  . Diabetes mellitus   . Hyperlipidemia   . Hypotension    mild  . Ischemic cardiomyopathy    a. 2011 Echo: Ef 40%.  . Left wrist fracture   . Peripheral neuropathy   . Spinal stenosis   . Thyroid disease    a. low TSH and elevated free T4 in June 2015, amiodarone was stopped at that time    Family History  Family history unknown: Yes    Past Surgical History:  Procedure Laterality Date  . BACK SURGERY  1975  . CARDIOVERSION N/A 08/22/2017   Procedure: CARDIOVERSION;  Surgeon: Antonieta Iba, MD;  Location: [facility] ORS;  Service: Cardiovascular;  Laterality: N/A;  . CAROTID ENDARTERECTOMY     2010  . CORONARY ARTERY BYPASS GRAFT  1996   x3  . ELECTROPHYSIOLOGIC STUDY N/A 06/29/2015   Procedure: Cardioversion;  Surgeon: Antonieta Iba, MD;  Location: [facility] ORS;  Service: Cardiovascular;  Laterality: N/A;  . EYE SURGERY  2013  . REPLACEMENT TOTAL KNEE     left  . TOOTH EXTRACTION     Social History   Occupational History  . Not on file  Tobacco Use  . Smoking status: Never Smoker  . Smokeless tobacco: Former Engineer, water and Sexual Activity  . Alcohol use: No  . Drug use: No  . Sexual activity: Not Currently

## 2018-10-09 ENCOUNTER — Telehealth: Payer: Self-pay

## 2018-10-09 NOTE — Telephone Encounter (Signed)
Virtual Visit Pre-Appointment Phone Call  "King, I am calling you today to discuss your upcoming appointment. We are currently trying to limit exposure to the virus that causes COVID-19 by seeing patients at home rather than in the office."  1. "What is the BEST phone number to call the day of the visit?" - include this in appointment notes  2. Do you have or have access to (through a family member/friend) a smartphone with video capability that we can use for your visit?" a. If yes - list this number in appt notes as cell (if different from BEST phone #) and list the appointment type as a VIDEO visit in appointment notes b. If no - list the appointment type as a PHONE visit in appointment notes  3. Confirm consent - "In the setting of the current Covid19 crisis, you are scheduled for a phone visit with your provider on 11/03/2018 at 10:40AM.  Just as we do with many in-office visits, in order for you to participate in this visit, we must obtain consent.  If you'd like, I can send this to your mychart (if signed up) or email for you to review.  Otherwise, I can obtain your verbal consent now.  All virtual visits are billed to your insurance company just like a normal visit would be.  By agreeing to a virtual visit, we'd like you to understand that the technology does not allow for your provider to perform an examination, and thus may limit your provider's ability to fully assess your condition. If your provider identifies any concerns that need to be evaluated in person, we will make arrangements to do so.  Finally, though the technology is pretty good, we cannot assure that it will always work on either your or our end, and in the setting of a video visit, we may have to convert it to a phone-only visit.  In either situation, we cannot ensure that we have a secure connection.  Are you willing to proceed?" STAFF: Did the patient verbally acknowledge consent to telehealth visit? Document YES/NO  here: YES PER WIFE Mark Dougherty  4. Advise patient to be prepared - "Two hours prior to your appointment, go ahead and check your blood pressure, pulse, oxygen saturation, and your weight (if you have the equipment to check those) and write them all down. When your visit starts, your provider will ask you for this information. If you have an Apple Watch or Kardia device, please plan to have heart rate information ready on the day of your appointment. Please have a pen and paper handy nearby the day of the visit as well."  5. Give patient instructions for MyChart download to smartphone OR [email or web address] as below if video visit (depending on what platform provider is using)  6. Inform patient they will receive a phone call 15 minutes prior to their appointment time (may be from unknown caller ID) so they should be prepared to answer    TELEPHONE CALL NOTE  DORSEY AUTHEMENT has been deemed a candidate for a follow-up tele-health visit to limit community exposure during the Covid-19 pandemic. I spoke with the patient via phone to ensure availability of phone/video source, confirm preferred email & phone number, and discuss instructions and expectations.  I reminded TYRELLE RACZKA to be prepared with any vital sign and/or heart rhythm information that could potentially be obtained via home monitoring, at the time of his visit. I reminded JAQUIS PICKLESIMER to expect a phone call prior  to his visit.  Tommie Sams McClain 10/09/2018 3:10 PM    FULL LENGTH CONSENT FOR TELE-HEALTH VISIT   I hereby voluntarily request, consent and authorize [facility] and its employed or contracted physicians, physician assistants, nurse practitioners or other licensed health care professionals (the Practitioner), to provide me with telemedicine health care services (the Services") as deemed necessary by the treating Practitioner. I acknowledge and consent to receive the Services by the Practitioner via telemedicine. I  understand that the telemedicine visit will involve communicating with the Practitioner through live audiovisual communication technology and the disclosure of certain medical information by electronic transmission. I acknowledge that I have been given the opportunity to request an in-person assessment or other available alternative prior to the telemedicine visit and am voluntarily participating in the telemedicine visit.  I understand that I have the right to withhold or withdraw my consent to the use of telemedicine in the course of my care at any time, without affecting my right to future care or treatment, and that the Practitioner or I may terminate the telemedicine visit at any time. I understand that I have the right to inspect all information obtained and/or recorded in the course of the telemedicine visit and may receive copies of available information for a reasonable fee.  I understand that some of the potential risks of receiving the Services via telemedicine include:   Delay or interruption in medical evaluation due to technological equipment failure or disruption;  Information transmitted may not be sufficient (e.g. poor resolution of images) to allow for appropriate medical decision making by the Practitioner; and/or   In rare instances, security protocols could fail, causing a breach of personal health information.  Furthermore, I acknowledge that it is my responsibility to provide information about my medical history, conditions and care that is complete and accurate to the best of my ability. I acknowledge that Practitioner's advice, recommendations, and/or decision may be based on factors not within their control, such as incomplete or inaccurate data provided by me or distortions of diagnostic images or specimens that may result from electronic transmissions. I understand that the practice of medicine is not an exact science and that Practitioner makes no warranties or guarantees  regarding treatment outcomes. I acknowledge that I will receive a copy of this consent concurrently upon execution via email to the email address I last provided but may also request a printed copy by calling the office of [facility].    I understand that my insurance will be billed for this visit.   I have read or had this consent read to me.  I understand the contents of this consent, which adequately explains the benefits and risks of the Services being provided via telemedicine.   I have been provided ample opportunity to ask questions regarding this consent and the Services and have had my questions answered to my satisfaction.  I give my informed consent for the services to be provided through the use of telemedicine in my medical care  By participating in this telemedicine visit I agree to the above.

## 2018-11-02 NOTE — Progress Notes (Signed)
Virtual Visit via Telephone Note   This visit type was conducted due to national recommendations for restrictions regarding the COVID-19 Pandemic (e.g. social distancing) in an effort to limit this patient's exposure and mitigate transmission in our community.  Due to his co-morbid illnesses, this patient is at least at moderate risk for complications without adequate follow up.  This format is felt to be most appropriate for this patient at this time.  The patient did not have access to video technology/had technical difficulties with video requiring transitioning to audio format only (telephone).  All issues noted in this document were discussed and addressed.  No physical exam could be performed with this format.  Please refer to the patient's chart for his  consent to telehealth for [facility].   I connected with  Mark NeasWalter L Necaise on 11/03/18 by a video enabled telemedicine application and verified that I am speaking with the correct person using two identifiers. I discussed the limitations of evaluation and management by telemedicine. The patient expressed understanding and agreed to proceed.   Evaluation Performed:  Follow-up visit  Date:  11/03/2018   ID:  Mark NeasWalter L Arora, DOB 08/27/1926, MRN [id number]  Patient Location:  [address] [place] [id number]   Provider location:   [facility], [place] office  PCP:  Jaclyn Shaggyate, Denny C, MD  Cardiologist:  Hubbard RobinsonGollan, [facility]   Chief Complaint:  Leg weakness   History of Present Illness:    Mark Dougherty is a 85 y.o. male who presents via audio/video conferencing for a telehealth visit today.   The patient does not symptoms concerning for COVID-19 infection (fever, chills, cough, or new SHORTNESS OF BREATH).   Patient has a past medical history of coronary artery disease,  [place],  PAD with CEA on the left in 2000 on the left,  diabetes,  severe chronic back pain with MRI showing  spinal stenosis,  hyperlipidemia,  back surgery 4 to 5 years ago with complications including atrial fibrillation in the perioperative, postoperative period started on amiodarone with conversion to sinus rhythm, with a repeat admission to [facility] for dehydration, hypotension and malaise.  hospital admission February 2016 for failure to thrive, dehydration. Previous EKG showing atrial flutter, underwent cardioversion 3/2017which was successful in restoring normal sinus rhythm echocardiogram 2011 showing ejection fraction 40% He presents for routine followup of his coronary artery disease and syncope  Very sedentary, prior falls (4 to 5 months ago) Uses a power mobility device Get around well, severe limitations in his mobility. significant fall risk spinal stenosis, has back pain, rare pain pill use peripheral neuropathy osteoarthritis of both his legs which is quite severe  Cardioversion 07/2017,  Denies arrhythmia  Chronic SOB, no changes  Weight has been stable No syncope or near syncope Blood pressure stable at home  other past medical history reviewed Episode of syncope July  2018 UTI Carotid ultrasound less than 49% proximal right carotid disease, no significant stenosis on the left, Vertebral arteries patent Echocardiogram ejection fraction 55-60%, mild aortic valve stenosis Recent event monitor with one run of nonsustained VT 9 beats, no symptoms    Prior CV studies:   The following studies were reviewed today:  Echo 11/2016 - Left ventricle: The cavity size was normal. Systolic function was   normal. The estimated ejection fraction was in the range of 55%   to 60%. Wall motion was normal; there were no regional wall  motion abnormalities. Doppler parameters are consistent with   abnormal left ventricular relaxation (grade 1 diastolic   dysfunction). - Aortic valve: severely calcified leaflets. There was mild to   moderate stenosis by estimated AVA.  Stenosis is borderline mild   by mean gradient and peak velocity. Valve area (VTI): 1.32 cm^2. - Left atrium: The atrium was mildly dilated. - Right ventricle: Systolic function was normal. - Pulmonary arteries: Systolic pressure was within the normal   range.  Past Medical History:  Diagnosis Date  . A-fib ([place])    a. in perioperative state-->converted to NSR on amiodarone  . Atrial flutter ([place])    a. s/p DCCV 06/29/2015  . BPH (benign prostatic hyperplasia)   . CAD (coronary artery disease)    a. s/p CABG in 1996 in [place]  . Carotid artery stenosis    a. s/p left-sided CEA 2000; b. 02/2016 Carotid U/S: <39% bilat dzs.  . Chronic systolic CHF (congestive heart failure) ([place])    a. echo 2011 with EF 40%  . Diabetes mellitus   . Hyperlipidemia   . Hypotension    mild  . Ischemic cardiomyopathy    a. 2011 Echo: Ef 40%.  . Left wrist fracture   . Peripheral neuropathy   . Spinal stenosis   . Thyroid disease    a. low TSH and elevated free T4 in June 2015, amiodarone was stopped at that time   Past Surgical History:  Procedure Laterality Date  . BACK SURGERY  1975  . CARDIOVERSION N/A 08/22/2017   Procedure: CARDIOVERSION;  Surgeon: Minna Merritts, MD;  Location: [facility] ORS;  Service: Cardiovascular;  Laterality: N/A;  . CAROTID ENDARTERECTOMY     2010  . CORONARY ARTERY BYPASS GRAFT  1996   x3  . ELECTROPHYSIOLOGIC STUDY N/A 06/29/2015   Procedure: Cardioversion;  Surgeon: Minna Merritts, MD;  Location: [facility] ORS;  Service: Cardiovascular;  Laterality: N/A;  . EYE SURGERY  2013  . REPLACEMENT TOTAL KNEE     left  . TOOTH EXTRACTION       No outpatient medications have been marked as taking for the 11/03/18 encounter (Appointment) with Minna Merritts, MD.     Allergies:   Patient has no known allergies.   Social History   Tobacco Use  . Smoking status: Never Smoker  . Smokeless tobacco: Former Network engineer Use Topics  . Alcohol use: No  . Drug use: No      Current Outpatient Medications on File Prior to Visit  Medication Sig Dispense Refill  . apixaban (ELIQUIS) 5 MG TABS tablet Take 1 tablet (5 mg total) by mouth 2 (two) times daily. 60 tablet 11  . Cyanocobalamin (VITAMIN B-12 PO) Take 1 tablet by mouth daily.     . diclofenac sodium (VOLTAREN) 1 % GEL Apply 4 g topically 4 (four) times daily. 1 Tube 1  . finasteride (PROSCAR) 5 MG tablet Take 5 mg by mouth daily.    . folic acid (FOLVITE) 1 MG tablet Take 1 mg by mouth daily.      Marland Kitchen HYDROcodone-acetaminophen (NORCO/VICODIN) 5-325 MG tablet Take 1 tablet by mouth every 6 (six) hours as needed for moderate pain. 20 tablet 0  . magnesium oxide (MAG-OX) 400 MG tablet Take 1 tablet (400 mg total) by mouth daily. 30 tablet 6  . metFORMIN (GLUCOPHAGE) 500 MG tablet Take 500 mg by mouth 2 (two) times daily with a meal.      . Multiple Vitamins-Minerals (CENTRUM PO)  Take 1 tablet by mouth daily.    Marland Kitchen nystatin (MYCOSTATIN/NYSTOP) powder Apply topically 4 (four) times daily. (Patient taking differently: Apply 1 g topically 4 (four) times daily as needed (for rash). ) 15 g 0  . simvastatin (ZOCOR) 20 MG tablet TAKE ONE TABLET BY MOUTH EVERY NIGHT AT BEDTIME (Patient taking differently: TAKE 20 MG BY MOUTH EVERY NIGHT AT BEDTIME) 30 tablet 3  . terazosin (HYTRIN) 1 MG capsule Take 1 mg by mouth at bedtime.    . Tetrahyd-Glyc-Hypro-PEG-ZnSulf (VISINE TOTALITY MULTI-SYMPTOM OP) Place 2 drops into both eyes daily as needed (for dry eyes).     No current facility-administered medications on file prior to visit.      Family Hx: The patient's Family history is unknown by patient.  ROS:   Please see the history of present illness.    Review of Systems  Constitutional: Negative.   HENT: Negative.   Respiratory: Negative.   Cardiovascular: Positive for leg swelling.  Gastrointestinal: Negative.   Musculoskeletal: Negative.   Neurological: Negative.   Psychiatric/Behavioral: Negative.   All other systems  reviewed and are negative.    Labs/Other Tests and Data Reviewed:    Recent Labs: No results found for requested labs within last 8760 hours.   Recent Lipid Panel No results found for: CHOL, TRIG, HDL, CHOLHDL, LDLCALC, LDLDIRECT  Wt Readings from Last 3 Encounters:  08/21/18 214 lb (97.1 kg)  09/12/17 215 lb 8 oz (97.8 kg)  08/22/17 215 lb (97.5 kg)     Exam:    Vital Signs: Vital signs may also be detailed in the HPI There were no vitals taken for this visit.  Wt Readings from Last 3 Encounters:  08/21/18 214 lb (97.1 kg)  09/12/17 215 lb 8 oz (97.8 kg)  08/22/17 215 lb (97.5 kg)   Temp Readings from Last 3 Encounters:  08/21/18 (!) 97.4 F (36.3 C) (Oral)  08/22/17 98.2 F (36.8 C) (Oral)  01/23/17 98 F (36.7 C) (Oral)   BP Readings from Last 3 Encounters:  08/21/18 (!) 148/80  09/12/17 103/61  08/22/17 120/68   Pulse Readings from Last 3 Encounters:  08/21/18 94  09/12/17 89  08/22/17 74    140/80,pulse 90, resp 16  Well nourished, well developed male in no acute distress. Constitutional:  oriented to person, place, and time. No distress.    ASSESSMENT & PLAN:    Coronary artery disease of native artery of native heart with stable angina pectoris (HCC) -  Currently with no symptoms of angina. No further workup at this time. Continue current medication regimen.  Paroxysmal atrial fibrillation (HCC) - Prior cardioversions, feels he is maintaining normal sinus rhythm  Ischemic cardiomyopathy - Denies any chest pain concerning for angina No further ischemic work-up at this time  Chronic systolic CHF (congestive heart failure) (HCC) - Minimal leg swelling, reports he is euvolemic  Bilateral carotid artery stenosis -  Will repeat u/s in follow up  Controlled type 2 diabetes mellitus with complication, without long-term current use of insulin (HCC) - managed by primary care No recent lab work available, has scheduled follow-up visit September 2020   Mixed hyperlipidemia - Cholesterol is at goal on the current lipid regimen. No changes to the medications were made.  Congestive dilated cardiomyopathy (HCC) -  Euvolemic, weight stable  Typical atrial flutter (HCC) - Prior cardioversion x2 in 2017 and 2019 On eliquis   COVID-19 Education: The signs and symptoms of COVID-19 were discussed with the patient and how  to seek care for testing (follow up with PCP or arrange E-visit).  The importance of social distancing was discussed today.  Patient Risk:   After full review of this patients clinical status, I feel that they are at least moderate risk at this time.  Time:   Today, I have spent 25 minutes with the patient with telehealth technology discussing the cardiac and medical problems/diagnoses detailed above   10 min spent reviewing the chart prior to patient visit today   Medication Adjustments/Labs and Tests Ordered: Current medicines are reviewed at length with the patient today.  Concerns regarding medicines are outlined above.   Tests Ordered: No tests ordered   Medication Changes: No changes made   Disposition: Follow-up in 6 months   Signed, Julien Nordmannimothy Gollan, MD  11/03/2018 10:53 AM    [facility] [place] [facility] [address], [place], [place] [address]

## 2018-11-03 ENCOUNTER — Telehealth (INDEPENDENT_AMBULATORY_CARE_PROVIDER_SITE_OTHER): Payer: Medicare Other | Admitting: Cardiovascular Disease

## 2018-11-03 ENCOUNTER — Other Ambulatory Visit: Payer: Self-pay

## 2018-11-03 DIAGNOSIS — I5022 Chronic systolic (congestive) heart failure: Secondary | ICD-10-CM

## 2018-11-03 DIAGNOSIS — I25118 Atherosclerotic heart disease of native coronary artery with other forms of angina pectoris: Secondary | ICD-10-CM | POA: Diagnosis not present

## 2018-11-03 DIAGNOSIS — E782 Mixed hyperlipidemia: Secondary | ICD-10-CM

## 2018-11-03 DIAGNOSIS — I6523 Occlusion and stenosis of bilateral carotid arteries: Secondary | ICD-10-CM

## 2018-11-03 DIAGNOSIS — E118 Type 2 diabetes mellitus with unspecified complications: Secondary | ICD-10-CM

## 2018-11-03 DIAGNOSIS — I483 Typical atrial flutter: Secondary | ICD-10-CM

## 2018-11-03 DIAGNOSIS — I480 Paroxysmal atrial fibrillation: Secondary | ICD-10-CM

## 2018-11-03 DIAGNOSIS — I420 Dilated cardiomyopathy: Secondary | ICD-10-CM

## 2018-11-03 DIAGNOSIS — I255 Ischemic cardiomyopathy: Secondary | ICD-10-CM

## 2018-11-03 NOTE — Patient Instructions (Signed)

## 2018-12-21 ENCOUNTER — Emergency Department: Payer: Medicare Other

## 2018-12-21 ENCOUNTER — Other Ambulatory Visit: Payer: Self-pay

## 2018-12-21 ENCOUNTER — Inpatient Hospital Stay
Admission: EM | Admit: 2018-12-21 | Discharge: 2018-12-23 | DRG: 554 | Disposition: A | Discharge status: Home Health | Payer: Medicare Other | Attending: Internal Medicine | Admitting: Internal Medicine

## 2018-12-21 ENCOUNTER — Encounter: Payer: Self-pay | Admitting: Emergency Medicine

## 2018-12-21 DIAGNOSIS — I6523 Occlusion and stenosis of bilateral carotid arteries: Secondary | ICD-10-CM | POA: Diagnosis present

## 2018-12-21 DIAGNOSIS — R55 Syncope and collapse: Secondary | ICD-10-CM | POA: Diagnosis present

## 2018-12-21 DIAGNOSIS — I2510 Atherosclerotic heart disease of native coronary artery without angina pectoris: Secondary | ICD-10-CM | POA: Diagnosis present

## 2018-12-21 DIAGNOSIS — Z96659 Presence of unspecified artificial knee joint: Secondary | ICD-10-CM | POA: Diagnosis present

## 2018-12-21 DIAGNOSIS — G8929 Other chronic pain: Secondary | ICD-10-CM | POA: Diagnosis present

## 2018-12-21 DIAGNOSIS — N400 Enlarged prostate without lower urinary tract symptoms: Secondary | ICD-10-CM | POA: Diagnosis present

## 2018-12-21 DIAGNOSIS — Z951 Presence of aortocoronary bypass graft: Secondary | ICD-10-CM | POA: Diagnosis not present

## 2018-12-21 DIAGNOSIS — Z7984 Long term (current) use of oral hypoglycemic drugs: Secondary | ICD-10-CM

## 2018-12-21 DIAGNOSIS — I480 Paroxysmal atrial fibrillation: Secondary | ICD-10-CM | POA: Diagnosis present

## 2018-12-21 DIAGNOSIS — S8990XA Unspecified injury of unspecified lower leg, initial encounter: Secondary | ICD-10-CM | POA: Diagnosis present

## 2018-12-21 DIAGNOSIS — I5022 Chronic systolic (congestive) heart failure: Secondary | ICD-10-CM | POA: Diagnosis present

## 2018-12-21 DIAGNOSIS — R296 Repeated falls: Secondary | ICD-10-CM | POA: Diagnosis present

## 2018-12-21 DIAGNOSIS — I350 Nonrheumatic aortic (valve) stenosis: Secondary | ICD-10-CM | POA: Diagnosis present

## 2018-12-21 DIAGNOSIS — Z7901 Long term (current) use of anticoagulants: Secondary | ICD-10-CM

## 2018-12-21 DIAGNOSIS — I472 Ventricular tachycardia: Secondary | ICD-10-CM | POA: Diagnosis present

## 2018-12-21 DIAGNOSIS — I110 Hypertensive heart disease with heart failure: Secondary | ICD-10-CM | POA: Diagnosis present

## 2018-12-21 DIAGNOSIS — Z79899 Other long term (current) drug therapy: Secondary | ICD-10-CM | POA: Diagnosis not present

## 2018-12-21 DIAGNOSIS — R7989 Other specified abnormal findings of blood chemistry: Secondary | ICD-10-CM | POA: Diagnosis present

## 2018-12-21 DIAGNOSIS — R9431 Abnormal electrocardiogram [ECG] [EKG]: Secondary | ICD-10-CM | POA: Diagnosis not present

## 2018-12-21 DIAGNOSIS — M11261 Other chondrocalcinosis, right knee: Secondary | ICD-10-CM | POA: Diagnosis present

## 2018-12-21 DIAGNOSIS — E1121 Type 2 diabetes mellitus with diabetic nephropathy: Secondary | ICD-10-CM | POA: Diagnosis present

## 2018-12-21 DIAGNOSIS — I444 Left anterior fascicular block: Secondary | ICD-10-CM | POA: Diagnosis present

## 2018-12-21 DIAGNOSIS — M25561 Pain in right knee: Secondary | ICD-10-CM

## 2018-12-21 DIAGNOSIS — Z87891 Personal history of nicotine dependence: Secondary | ICD-10-CM | POA: Diagnosis not present

## 2018-12-21 DIAGNOSIS — Z20828 Contact with and (suspected) exposure to other viral communicable diseases: Secondary | ICD-10-CM | POA: Diagnosis present

## 2018-12-21 DIAGNOSIS — E785 Hyperlipidemia, unspecified: Secondary | ICD-10-CM | POA: Diagnosis present

## 2018-12-21 DIAGNOSIS — I255 Ischemic cardiomyopathy: Secondary | ICD-10-CM | POA: Diagnosis present

## 2018-12-21 DIAGNOSIS — M1990 Unspecified osteoarthritis, unspecified site: Secondary | ICD-10-CM | POA: Diagnosis present

## 2018-12-21 DIAGNOSIS — E118 Type 2 diabetes mellitus with unspecified complications: Secondary | ICD-10-CM | POA: Diagnosis present

## 2018-12-21 DIAGNOSIS — M25562 Pain in left knee: Secondary | ICD-10-CM

## 2018-12-21 LAB — COMPREHENSIVE METABOLIC PANEL
ALT: 10 U/L (ref 0–44)
AST: 25 U/L (ref 15–41)
Albumin: 3.3 g/dL — ABNORMAL LOW (ref 3.5–5.0)
Alkaline Phosphatase: 84 U/L (ref 38–126)
Anion gap: 12 (ref 5–15)
BUN: 24 mg/dL — ABNORMAL HIGH (ref 8–23)
CO2: 24 mmol/L (ref 22–32)
Calcium: 8.9 mg/dL (ref 8.9–10.3)
Chloride: 98 mmol/L (ref 98–111)
Creatinine, Ser: 1.22 mg/dL (ref 0.61–1.24)
GFR calc Af Amer: >60 mL/min (ref >60)
GFR calc non Af Amer: 52 mL/min — ABNORMAL LOW (ref >60)
Glucose, Bld: 164 mg/dL — ABNORMAL HIGH (ref 70–99)
Potassium: 4.3 mmol/L (ref 3.5–5.1)
Sodium: 134 mmol/L — ABNORMAL LOW (ref 135–145)
Total Bilirubin: 0.9 mg/dL (ref 0.3–1.2)
Total Protein: 7.5 g/dL (ref 6.5–8.1)

## 2018-12-21 LAB — CBC WITH DIFFERENTIAL/PLATELET
Abs Immature Granulocytes: 0.03 10*3/uL (ref 0.00–0.07)
Basophils Absolute: 0 10*3/uL (ref 0.0–0.1)
Basophils Relative: 0 %
Eosinophils Absolute: 0 10*3/uL (ref 0.0–0.5)
Eosinophils Relative: 0 %
HCT: 37.9 % — ABNORMAL LOW (ref 39.0–52.0)
Hemoglobin: 12.3 g/dL — ABNORMAL LOW (ref 13.0–17.0)
Immature Granulocytes: 0 %
Lymphocytes Relative: 32 %
Lymphs Abs: 3.4 10*3/uL (ref 0.7–4.0)
MCH: 30.7 pg (ref 26.0–34.0)
MCHC: 32.5 g/dL (ref 30.0–36.0)
MCV: 94.5 fL (ref 80.0–100.0)
Monocytes Absolute: 1.4 10*3/uL — ABNORMAL HIGH (ref 0.1–1.0)
Monocytes Relative: 14 %
Neutro Abs: 5.7 10*3/uL (ref 1.7–7.7)
Neutrophils Relative %: 54 %
Platelets: 293 10*3/uL (ref 150–400)
RBC: 4.01 MIL/uL — ABNORMAL LOW (ref 4.22–5.81)
RDW: 12.4 % (ref 11.5–15.5)
WBC: 10.7 10*3/uL — ABNORMAL HIGH (ref 4.0–10.5)
nRBC: 0 % (ref 0.0–0.2)

## 2018-12-21 LAB — TROPONIN I (HIGH SENSITIVITY): Troponin I (High Sensitivity): 307 ng/L (ref <18)

## 2018-12-21 LAB — CK: Total CK: 82 U/L (ref 49–397)

## 2018-12-21 MED ORDER — FENTANYL CITRATE (PF) 100 MCG/2ML IJ SOLN
25.0000 ug | Freq: Once | INTRAMUSCULAR | Status: AC
  Administered 2018-12-21: 25 ug via INTRAVENOUS
  Filled 2018-12-21: qty 2

## 2018-12-21 NOTE — ED Triage Notes (Signed)
Pt presents from home via acems with c/o bilateral knee pain. Pt states both knees are more swollen than normal. Pt states he is unable to ambulate due to pain.

## 2018-12-21 NOTE — H&P (Signed)
[facility] [place] [facility]   PATIENT NAME: Mark Dougherty    MR#:  [id number]  DATE OF BIRTH:  Nov 06, 1926  DATE OF ADMISSION:  12/21/2018  PRIMARY CARE PHYSICIAN: [name], MD   REQUESTING/REFERRING PHYSICIAN: Erma Heritage, MD  CHIEF COMPLAINT:   Chief Complaint  Patient presents with  . Knee Pain    HISTORY OF PRESENT ILLNESS:  Mark Dougherty  is a 85 y.o. male who presents with chief complaint as above.  Patient presents to the ED due to complaint of not being able to bear weight on his knees.  He has fallen several times recently at home and has injured both of his knees.  He recounts a couple of falls that were mechanical in nature.  However, he states that 3 days ago he fell due to a "blackout" episode.  His right knee is significantly swollen and he states that he cannot bear any weight on it at all.  His left knee is also swollen and he states that it hurts to bear weight on it.  Imaging here in the ED shows no fractures.  Hospitalist called for admission  PAST MEDICAL HISTORY:   Past Medical History:  Diagnosis Date  . A-fib (HCC)    a. in perioperative state-->converted to NSR on amiodarone  . Atrial flutter (HCC)    a. s/p DCCV 06/29/2015  . BPH (benign prostatic hyperplasia)   . CAD (coronary artery disease)    a. s/p CABG in 1996 in [place]  . Carotid artery stenosis    a. s/p left-sided CEA 2000; b. 02/2016 Carotid U/S: <39% bilat dzs.  . Chronic systolic CHF (congestive heart failure) (HCC)    a. echo 2011 with EF 40%  . Diabetes mellitus   . Hyperlipidemia   . Hypotension    mild  . Ischemic cardiomyopathy    a. 2011 Echo: Ef 40%.  . Left wrist fracture   . Peripheral neuropathy   . Spinal stenosis   . Thyroid disease    a. low TSH and elevated free T4 in June 2015, amiodarone was stopped at that time     PAST SURGICAL HISTORY:   Past Surgical History:  Procedure Laterality Date  . BACK SURGERY  1975  . CARDIOVERSION N/A  08/22/2017   Procedure: CARDIOVERSION;  Surgeon: Antonieta Iba, MD;  Location: [facility] ORS;  Service: Cardiovascular;  Laterality: N/A;  . CAROTID ENDARTERECTOMY     2010  . CORONARY ARTERY BYPASS GRAFT  1996   x3  . ELECTROPHYSIOLOGIC STUDY N/A 06/29/2015   Procedure: Cardioversion;  Surgeon: Antonieta Iba, MD;  Location: [facility] ORS;  Service: Cardiovascular;  Laterality: N/A;  . EYE SURGERY  2013  . REPLACEMENT TOTAL KNEE     left  . TOOTH EXTRACTION       SOCIAL HISTORY:   Social History   Tobacco Use  . Smoking status: Never Smoker  . Smokeless tobacco: Former Engineer, water Use Topics  . Alcohol use: No     FAMILY HISTORY:   Family History  Family history unknown: Yes     DRUG ALLERGIES:  No Known Allergies  MEDICATIONS AT HOME:   Prior to Admission medications   Medication Sig Start Date End Date Taking? Authorizing Provider  apixaban (ELIQUIS) 5 MG TABS tablet Take 1 tablet (5 mg total) by mouth 2 (two) times daily. 04/04/17   Dunn, Raymon Mutton, PA-C  Cyanocobalamin (VITAMIN B-12 PO) Take 1 tablet by mouth daily.  [provider]  diclofenac sodium (VOLTAREN) 1 % GEL Apply 4 g topically 4 (four) times daily. 08/21/18   Triplett, Cari B, FNP  finasteride (PROSCAR) 5 MG tablet Take 5 mg by mouth daily.    [provider]  folic acid (FOLVITE) 1 MG tablet Take 1 mg by mouth daily.      [provider]  HYDROcodone-acetaminophen (NORCO/VICODIN) 5-325 MG tablet Take 1 tablet by mouth every 6 (six) hours as needed for moderate pain. 10/02/18   Kathryne HitchBlackman, Christopher Y, MD  magnesium oxide (MAG-OX) 400 MG tablet Take 1 tablet (400 mg total) by mouth daily. 01/03/17   Dunn, Raymon Muttonyan M, PA-C  metFORMIN (GLUCOPHAGE) 500 MG tablet Take 500 mg by mouth 2 (two) times daily with a meal.      [provider]  Multiple Vitamins-Minerals (CENTRUM PO) Take 1 tablet by mouth daily.    [provider]  nystatin (MYCOSTATIN/NYSTOP) powder Apply  topically 4 (four) times daily. Patient taking differently: Apply 1 g topically 4 (four) times daily as needed (for rash).  01/05/17   Governor RooksLord, Rebecca, MD  simvastatin (ZOCOR) 20 MG tablet TAKE ONE TABLET BY MOUTH EVERY NIGHT AT BEDTIME Patient taking differently: TAKE 20 MG BY MOUTH EVERY NIGHT AT BEDTIME 02/04/17   Antonieta IbaGollan, Timothy J, MD  terazosin (HYTRIN) 1 MG capsule Take 1 mg by mouth at bedtime.    [provider]  Tetrahyd-Glyc-Hypro-PEG-ZnSulf (VISINE TOTALITY MULTI-SYMPTOM OP) Place 2 drops into both eyes daily as needed (for dry eyes).    [provider]    REVIEW OF SYSTEMS:  Review of Systems  Constitutional: Negative for chills, fever, malaise/fatigue and weight loss.  HENT: Negative for ear pain, hearing loss and tinnitus.   Eyes: Negative for blurred vision, double vision, pain and redness.  Respiratory: Negative for cough, hemoptysis and shortness of breath.   Cardiovascular: Negative for chest pain, palpitations, orthopnea and leg swelling.  Gastrointestinal: Negative for abdominal pain, constipation, diarrhea, nausea and vomiting.  Genitourinary: Negative for dysuria, frequency and hematuria.  Musculoskeletal: Positive for falls and joint pain (Bilateral knees). Negative for back pain and neck pain.  Skin:       No acne, rash, or lesions  Neurological: Positive for loss of consciousness. Negative for dizziness, tremors, focal weakness and weakness.  Endo/Heme/Allergies: Negative for polydipsia. Does not bruise/bleed easily.  Psychiatric/Behavioral: Negative for depression. The patient is not nervous/anxious and does not have insomnia.      VITAL SIGNS:   Vitals:   12/21/18 1907 12/21/18 1917  BP:  (!) 138/95  Resp:  20  Temp:  98.1 F (36.7 C)  TempSrc:  Oral  SpO2:  96%  Weight: 97.1 kg   Height: 5\' 9"  (1.753 m)    Wt Readings from Last 3 Encounters:  12/21/18 97.1 kg  08/21/18 97.1 kg  09/12/17 97.8 kg    PHYSICAL EXAMINATION:  Physical  Exam  Vitals reviewed. Constitutional: He is oriented to person, place, and time. He appears well-developed and well-nourished. No distress.  HENT:  Head: Normocephalic and atraumatic.  Mouth/Throat: Oropharynx is clear and moist.  Eyes: Pupils are equal, round, and reactive to light. Conjunctivae and EOM are normal. No scleral icterus.  Neck: Normal range of motion. Neck supple. No JVD present. No thyromegaly present.  Cardiovascular: Normal rate, regular rhythm and intact distal pulses. Exam reveals no gallop and no friction rub.  No murmur heard. Respiratory: Effort normal and breath sounds normal. No respiratory distress. He has no  wheezes. He has no rales.  GI: Soft. Bowel sounds are normal. He exhibits no distension. There is no abdominal tenderness.  Musculoskeletal: Normal range of motion.        General: Tenderness (Bilateral knees) and edema (Bilateral, right greater than left, knees) present.     Comments: No arthritis, no gout  Lymphadenopathy:    He has no cervical adenopathy.  Neurological: He is alert and oriented to person, place, and time. No cranial nerve deficit.  No dysarthria, no aphasia  Skin: Skin is warm and dry. No rash noted. No erythema.  Psychiatric: He has a normal mood and affect. His behavior is normal. Judgment and thought content normal.    LABORATORY PANEL:   CBC Recent Labs  Lab 12/21/18 2030  WBC 10.7*  HGB 12.3*  HCT 37.9*  PLT 293   ------------------------------------------------------------------------------------------------------------------  Chemistries  Recent Labs  Lab 12/21/18 2030  NA 134*  K 4.3  CL 98  CO2 24  GLUCOSE 164*  BUN 24*  CREATININE 1.22  CALCIUM 8.9  AST 25  ALT 10  ALKPHOS 84  BILITOT 0.9   ------------------------------------------------------------------------------------------------------------------  Cardiac Enzymes No results for input(s): TROPONINI in the last 168  hours. ------------------------------------------------------------------------------------------------------------------  RADIOLOGY:  Ct Head Wo Contrast  Result Date: 12/21/2018 CLINICAL DATA:  Headache EXAM: CT HEAD WITHOUT CONTRAST TECHNIQUE: Contiguous axial images were obtained from the base of the skull through the vertex without intravenous contrast. COMPARISON:  11/29/2016 FINDINGS: Brain: No evidence of acute infarction, hemorrhage, hydrocephalus, extra-axial collection or mass lesion/mass effect. Periventricular white matter hypodensity. Vascular: No hyperdense vessel or unexpected calcification. Skull: Normal. Negative for fracture or focal lesion. Sinuses/Orbits: No acute finding. Other: None. IMPRESSION: No acute intracranial pathology. Small-vessel white matter disease in keeping with advanced patient age. Electronically Signed   By: Lauralyn PrimesAlex  Bibbey M.D.   On: 12/21/2018 21:16   Ct Lumbar Spine Wo Contrast  Result Date: 12/21/2018 CLINICAL DATA:  85 year old male with low back pain. Unable to walk due to pain. EXAM: CT LUMBAR SPINE WITHOUT CONTRAST TECHNIQUE: Multidetector CT imaging of the lumbar spine was performed without intravenous contrast administration. Multiplanar CT image reconstructions were also generated. COM[facility]  [facility]. FINDINGS: Segmentation: Normal. Alignment: Mildly increased reversal of lumbar lordosis at L2-L3 since the 2010 MRI. Subtle grade 1 anterolisthesis has developed at L4-L5. Stable lumbar lordosis elsewhere. Vertebrae: Osteopenia. Multilevel postoperative changes to the lumbar posterior elements are new since the 2010 MRI, detailed below. No superimposed No acute osseous abnormality identified. Paraspinal and other soft tissues: Calcified aortic atherosclerosis. Negative visible noncontrast abdominal viscera. Mild postoperative changes to the posterior paraspinal soft tissues. Disc levels: T11-T12: Possible  interbody ankylosis due to anterior endplate osteophyte or syndesmophyte. T12-L1:  Mild disc bulge and anterior endplate spurring. L1-L2: Mild vacuum disc and far lateral disc bulge with endplate spurring. Mild posterior element hypertrophy. Mild spinal stenosis suspected and probably not significantly changed since 2010. Associated chronic left L1 foraminal stenosis. L2-L3: Interval posterior decompression and interbody ankylosis. The severe spinal stenosis at this level in 2010 appears resolved. No definite foraminal stenosis. L3-L4: Vacuum disc with circumferential disc bulge. Endplate spurring and posterior element hypertrophy. Mild to moderate spinal and bilateral foraminal stenosis suspected at this level and increased since 2010. L4-L5: Posterior decompression at this level since 2010 with resolved spinal stenosis since that time. Circumferential disc bulge and residual facet hypertrophy. New mild anterolisthesis. Mild to moderate bilateral lateral recess and L4 foraminal stenosis persists. L5-S1: Previous  posterior decompression is stable since 2010. Evidence of interbody ankylosis now. No definite stenosis. IMPRESSION: 1. Osteopenia with no acute osseous abnormality identified. Ankylosis of T11-T12, L2-L3, and L5-S1. 2. Resolved spinal stenosis at several levels following posterior decompression since the 2010 MRI. Increased degenerative changes and stenosis suspected at L3-L4. Stable stenosis suspected at L1-L2. 3.  Aortic Atherosclerosis (ICD10-I70.0). Electronically Signed   By: Odessa FlemingH  Hall M.D.   On: 12/21/2018 21:21   Ct Knee Right Wo Contrast  Result Date: 12/21/2018 CLINICAL DATA:  [age] year old male with right knee swelling. EXAM: CT OF THE right KNEE WITHOUT CONTRAST TECHNIQUE: Multidetector CT imaging of the right knee was performed according to the standard protocol. Multiplanar CT image reconstructions were also generated. COMPARISON:  Right knee radiograph dated 12/21/2018 FINDINGS: Evaluation is  limited due to advanced osteopenia. Bones/Joint/Cartilage No definite acute fracture identified. Evaluation however is very limited due to osteopenia. There is no dislocation. There is severe arthritic changes with tricompartmental narrowing and bone spurring. There is mid-distal chondrocalcinosis. There is a moderate suprapatellar effusion. There is thickening and heterogeneity of the tissues surrounding the anterior and posterior cruciate ligaments. Small adjacent bone fragments posterior to the knee may represent loose body within the synovium or within a small Baker's cyst. Ligaments Suboptimally assessed by CT. Muscles and Tendons No acute finding. Moderate fatty atrophy. Soft tissues No acute findings. Vascular calcifications noted. IMPRESSION: 1. No definite acute fracture or dislocation. 2. Severe arthritic changes of the knee with chondrocalcinosis and a moderate suprapatellar effusion. Electronically Signed   By: Elgie CollardArash  Radparvar M.D.   On: 12/21/2018 21:23   Dg Knee Complete 4 Views Left  Result Date: 12/21/2018 CLINICAL DATA:  Fall EXAM: LEFT KNEE - COMPLETE 4+ VIEW COMPARISON:  11/28/2017 FINDINGS: No fracture or malalignment. Moderate severe arthritis involving the mediolateral and patellofemoral compartments. Joint space calcifications. Trace knee effusion. Vascular calcifications and surgical clips IMPRESSION: 1. No acute osseous abnormality 2. Tricompartment arthritis with trace knee effusion 3. Chondrocalcinosis Electronically Signed   By: Jasmine PangKim  Fujinaga M.D.   On: 12/21/2018 19:57   Dg Knee Complete 4 Views Right  Result Date: 12/21/2018 CLINICAL DATA:  Fall with knee pain EXAM: RIGHT KNEE - COMPLETE 4+ VIEW COMPARISON:  None. FINDINGS: No acute displaced fracture or malalignment. Moderate severe tricompartment arthritis of the knee. Small knee effusion. Lateral joint space calcification. Vascular calcifications. IMPRESSION: 1. No acute osseous abnormality 2. Arthritis of the knee with  small knee effusion 3. Chondrocalcinosis Electronically Signed   By: Jasmine PangKim  Fujinaga M.D.   On: 12/21/2018 20:00    EKG:   Orders placed or performed during the hospital encounter of 12/21/18  . ED EKG  . ED EKG    IMPRESSION AND PLAN:  Principal Problem:   Knee injury -bilateral knees.  This is from multiple recent falls.  Right knee is more swollen and more tender than the left.  Patient states he cannot bear any weight on the right knee, and minimal weight on the left.  He has no fractures on imaging.  He is on Eliquis due to A. fib/flutter, and I suspect he has some hemarthrosis.  We will admit him and get PT OT consults.  PRN analgesia Active Problems:   Syncope -patient recounts an episode 3 days ago where he "blacked out".  He does have a history of arrhythmias.  He also has a new LAFB on his EKG tonight.  He denies any recent episodes of chest pain, only endorsing this syncopal episode.  We will consult cardiology   Diabetes mellitus type 2, controlled, with complications ([place]) -sliding scale insulin coverage   CAD (coronary artery disease) -continue home meds, other work-up as above   PAF (paroxysmal atrial fibrillation) ([place]) -continue home medications.  Hold 1 dose Eliquis tonight as he is currently in sinus rhythm, and hopes to help curtail any bleeding into his knee joints.  Restart Eliquis tomorrow morning   Chronic systolic CHF (congestive heart failure) ([place]) -continue home meds   Hyperlipidemia -home dose antilipid  Chart review performed and case discussed with ED provider. Labs, imaging and/or ECG reviewed by provider and discussed with patient/family. Management plans discussed with the patient and/or family.  COVID-19 status: Pending, MD  DVT PROPHYLAXIS: Systemic anticoagulation  GI PROPHYLAXIS:  None  ADMISSION STATUS: Inpatient     CODE STATUS: Full Code Status History    Date Active Date Inactive Code Status Order ID Comments User Context   11/29/2016 1645  11/30/2016 1927 Full Code [id number]  Nicholes Mango, MD Inpatient   Advance Care Planning Activity      TOTAL TIME TAKING CARE OF THIS PATIENT: 45 minutes.   This patient was evaluated in the context of the global COVID-19 pandemic, which necessitated consideration that the patient might be at risk for infection with the SARS-CoV-2 virus that causes COVID-19. Institutional protocols and algorithms that pertain to the evaluation of patients at risk for COVID-19 are in a state of rapid change based on information released by regulatory bodies including the CDC and federal and state organizations. These policies and algorithms were followed to the best of this provider's knowledge to date during the patient's care at this facility.  Ethlyn Daniels 12/21/2018, 9:57 PM  [facility]  Office  [phone]  CC: Primary care physician; Albina Billet, MD  Note:  This document was prepared using Dragon voice recognition software and may include unintentional dictation errors.

## 2018-12-21 NOTE — ED Notes (Signed)
Pt leaving for CT.  

## 2018-12-21 NOTE — ED Notes (Signed)
EKG to be completed by [facility] NT.

## 2018-12-21 NOTE — ED Notes (Signed)
Pt back from imaging. Wife at bedside. Pt's R knee extremely swollen; pt has knot below L knee. Wife states pt fell few days ago onto L knee then today onto R knee. Pt states pain 10/10 on R knee and that he cannot put weight on it. Pulses 2+; cap refill <3; feet warm.

## 2018-12-21 NOTE — ED Notes (Signed)
Provider Jannifer Franklin notified of Trop 307.

## 2018-12-21 NOTE — ED Notes (Signed)
EKG to EDP Isaacs.  

## 2018-12-21 NOTE — ED Notes (Signed)
20g IV at L ac removed as it wouldn't thread properly. Was able to pull bloodwork and flush to give pain med. Blue/grn/lav tubes sent to lab.

## 2018-12-21 NOTE — ED Notes (Signed)
Pt leaving for imaging.

## 2018-12-21 NOTE — ED Notes (Signed)
Pt resting in bed. Understands he will leave for imaging soon.

## 2018-12-21 NOTE — ED Provider Notes (Signed)
[facility] Emergency Department Provider Note  ____________________________________________   First MD Initiated Contact with Patient 12/21/18 1919     (approximate)  I have reviewed the triage vital signs and the nursing notes.   HISTORY  Chief Complaint Knee Pain    HPI Mark Dougherty is a 85 y.o. male  With h/o CHF, HTN, HLD, AFib on Eliquis here with R>L knee pain, back pain, fall. Pt states that on Thursday, he slipped out of his transfer chair and landed on his R knee. Experienced immediate aching, throbbing knee pain. The next night, he was trying to use the restroom, standing, when he "blacked out." He did not feel any warning symptoms. He landed on his left knee at that time w/ onset of pain. He had to call EMS who had difficulty getting him up. Since then, he's also had moderate midline low back pain. Reports that since then, eh's been completely unable to walk or transfer. He lives with his elderly wife who has been unable to get him up. He's felt weak and had poor appetite. No further episodes of passing out. No CP. Reports severe R>L knee pain and swelling, 10/10, worse w/ any movement.        Past Medical History:  Diagnosis Date   A-fib [facility](HCC)    a. in perioperative state-->converted to NSR on amiodarone   Atrial flutter (HCC)    a. s/p DCCV 06/29/2015   BPH (benign prostatic hyperplasia)    CAD (coronary artery disease)    a. s/p CABG in 1996 in [place]   Carotid artery stenosis    a. s/p left-sided CEA 2000; b. 02/2016 Carotid U/S: <39% bilat dzs.   Chronic systolic CHF (congestive heart failure) (HCC)    a. echo 2011 with EF 40%   Diabetes mellitus    Hyperlipidemia    Hypotension    mild   Ischemic cardiomyopathy    a. 2011 Echo: Ef 40%.   Left wrist fracture    Peripheral neuropathy    Spinal stenosis    Thyroid disease    a. low TSH and elevated free T4 in June 2015, amiodarone was stopped at that time    Patient  Active Problem List   Diagnosis Date Noted   Knee injury 12/21/2018   Unilateral primary osteoarthritis, left knee 11/28/2017   Unilateral primary osteoarthritis, right knee 11/28/2017   Syncope 11/29/2016   Acute left-sided low back pain without sciatica 11/12/2016   Chronic systolic CHF (congestive heart failure) (HCC)    Ischemic cardiomyopathy    Thyroid disease    Typical atrial flutter (HCC)    Atrial flutter (HCC) 05/31/2015   Neuropathy 05/31/2015   PAF (paroxysmal atrial fibrillation) (HCC) 01/04/2011   Congestive dilated cardiomyopathy (HCC) 09/28/2010   Dehydration 09/14/2010   Diabetes mellitus type 2, controlled, with complications (HCC) 01/09/2010   Hyperlipidemia 01/09/2010   CAD (coronary artery disease) 01/09/2010   Carotid stenosis 01/09/2010    Past Surgical History:  Procedure Laterality Date   BACK SURGERY  1975   CARDIOVERSION N/A 08/22/2017   Procedure: CARDIOVERSION;  Surgeon: Antonieta IbaGollan, Timothy J, MD;  Location: [facility] ORS;  Service: Cardiovascular;  Laterality: N/A;   CAROTID ENDARTERECTOMY     2010   CORONARY ARTERY BYPASS GRAFT  1996   x3   ELECTROPHYSIOLOGIC STUDY N/A 06/29/2015   Procedure: Cardioversion;  Surgeon: Antonieta Ibaimothy J Gollan, MD;  Location: [facility] ORS;  Service: Cardiovascular;  Laterality: N/A;   EYE SURGERY  2013  REPLACEMENT TOTAL KNEE     left   TOOTH EXTRACTION      Prior to Admission medications   Medication Sig Start Date End Date Taking? Authorizing Provider  apixaban (ELIQUIS) 5 MG TABS tablet Take 1 tablet (5 mg total) by mouth 2 (two) times daily. 04/04/17  Yes Dunn, Raymon Muttonyan M, PA-C  Cyanocobalamin (VITAMIN B-12 PO) Take 1 tablet by mouth daily.    Yes [provider]  folic acid (FOLVITE) 1 MG tablet Take 1 mg by mouth daily.     Yes [provider]  HYDROcodone-acetaminophen (NORCO/VICODIN) 5-325 MG tablet Take 1 tablet by mouth every 6 (six) hours as needed for moderate pain. 10/02/18  Yes  Kathryne HitchBlackman, Christopher Y, MD  metFORMIN (GLUCOPHAGE) 500 MG tablet Take 500 mg by mouth 2 (two) times daily with a meal.     Yes [provider]  simvastatin (ZOCOR) 20 MG tablet TAKE ONE TABLET BY MOUTH EVERY NIGHT AT BEDTIME Patient taking differently: TAKE 20 MG BY MOUTH EVERY NIGHT AT BEDTIME 02/04/17  Yes Gollan, Tollie Pizzaimothy J, MD  terazosin (HYTRIN) 1 MG capsule Take 1 mg by mouth at bedtime.   Yes [provider]  Tetrahyd-Glyc-Hypro-PEG-ZnSulf (VISINE TOTALITY MULTI-SYMPTOM OP) Place 2 drops into both eyes daily as needed (for dry eyes).   Yes [provider]  diclofenac sodium (VOLTAREN) 1 % GEL Apply 4 g topically 4 (four) times daily. Patient not taking: Reported on 12/21/2018 08/21/18   Kem Boroughsriplett, Cari B, FNP  finasteride (PROSCAR) 5 MG tablet Take 5 mg by mouth daily.    [provider]  magnesium oxide (MAG-OX) 400 MG tablet Take 1 tablet (400 mg total) by mouth daily. Patient not taking: Reported on 12/21/2018 01/03/17   Sondra Bargesunn, Ryan M, PA-C  Multiple Vitamins-Minerals (CENTRUM PO) Take 1 tablet by mouth daily.    [provider]  nystatin (MYCOSTATIN/NYSTOP) powder Apply topically 4 (four) times daily. Patient taking differently: Apply 1 g topically 4 (four) times daily as needed (for rash).  01/05/17   Governor RooksLord, Rebecca, MD    Allergies Patient has no known allergies.  Family History  Family history unknown: Yes    Social History Social History   Tobacco Use   Smoking status: Never Smoker   Smokeless tobacco: Former NeurosurgeonUser  Substance Use Topics   Alcohol use: No   Drug use: No    Review of Systems  Review of Systems  Constitutional: Positive for fatigue. Negative for chills and fever.  HENT: Negative for sore throat.   Respiratory: Negative for shortness of breath.   Cardiovascular: Negative for chest pain.  Gastrointestinal: Negative for abdominal pain.  Genitourinary: Negative for flank pain.  Musculoskeletal: Positive for  arthralgias and joint swelling. Negative for neck pain.  Skin: Negative for rash and wound.  Allergic/Immunologic: Negative for immunocompromised state.  Neurological: Positive for syncope and weakness. Negative for numbness.  Hematological: Does not bruise/bleed easily.  All other systems reviewed and are negative.    ____________________________________________  PHYSICAL EXAM:      VITAL SIGNS: ED Triage Vitals  Enc Vitals Group     BP 12/21/18 1917 (!) 138/95     Pulse --      Resp 12/21/18 1917 20     Temp 12/21/18 1917 98.1 F (36.7 C)     Temp Source 12/21/18 1917 Oral     SpO2 12/21/18 1917 96 %     Weight 12/21/18 1907 214 lb 1.1 oz (97.1 kg)  Height 12/21/18 1907  (1.753 m)     Head Circumference --      Peak Flow --      Pain Score 12/21/18 1907 10     Pain Loc --      Pain Edu? --      Excl. in GC? --      Physical Exam Vitals signs and nursing note reviewed.  Constitutional:      General: He is not in acute distress.    Appearance: He is well-developed.     Comments: Appears uncomfortable, in pain  HENT:     Head: Normocephalic and atraumatic.  Eyes:     Conjunctiva/sclera: Conjunctivae normal.  Neck:     Musculoskeletal: Neck supple.  Cardiovascular:     Rate and Rhythm: Normal rate and regular rhythm.     Heart sounds: Normal heart sounds. No murmur. No friction rub.  Pulmonary:     Effort: Pulmonary effort is normal. No respiratory distress.     Breath sounds: Normal breath sounds. No wheezing or rales.  Abdominal:     General: There is no distension.     Palpations: Abdomen is soft.     Tenderness: There is no abdominal tenderness.  Musculoskeletal:     Comments: Large R knee effusion with no warmth or erythema. Exquisite pain with any pROM of knee. Left knee with effusion and TTP, worse along distal aspect/proximal tibia.  Skin:    General: Skin is warm.     Capillary Refill: Capillary refill takes less than 2 seconds.    Neurological:     Mental Status: He is alert and oriented to person, place, and time.     Motor: No abnormal muscle tone.       ____________________________________________   LABS (all labs ordered are listed, but only abnormal results are displayed)  Labs Reviewed  COMPREHENSIVE METABOLIC PANEL - Abnormal; Notable for the following components:      Result Value   Sodium 134 (*)    Glucose, Bld 164 (*)    BUN 24 (*)    Albumin 3.3 (*)    GFR calc non Af Amer 52 (*)    All other components within normal limits  CBC WITH DIFFERENTIAL/PLATELET - Abnormal; Notable for the following components:   WBC 10.7 (*)    RBC 4.01 (*)    Hemoglobin 12.3 (*)    HCT 37.9 (*)    Monocytes Absolute 1.4 (*)    All other components within normal limits  TROPONIN I (HIGH SENSITIVITY) - Abnormal; Notable for the following components:   Troponin I (High Sensitivity) 307 (*)    All other components within normal limits  TROPONIN I (HIGH SENSITIVITY) - Abnormal; Notable for the following components:   Troponin I (High Sensitivity) 289 (*)    All other components within normal limits  SARS CORONAVIRUS 2  CK  URINALYSIS, COMPLETE (UACMP) WITH MICROSCOPIC  HEMOGLOBIN A1C  BASIC METABOLIC PANEL  CBC    ____________________________________________  EKG: NSR, VR 94. TWI noted in I, aVL, lateral leads with no ST elevations. ________________________________________  RADIOLOGY All imaging, including plain films, CT scans, and ultrasounds, independently reviewed by me, and interpretations confirmed via formal radiology reads.  ED MD interpretation:   CT Head: NAICA CT Knee: Negative for fx CT Spine: degen changes, no fx  Official radiology report(s): Ct Head Wo Contrast  Result Date: 12/21/2018 CLINICAL DATA:  Headache EXAM: CT HEAD WITHOUT CONTRAST TECHNIQUE: Contiguous axial images were obtained  from the base of the skull through the vertex without intravenous contrast. COMPARISON:   11/29/2016 FINDINGS: Brain: No evidence of acute infarction, hemorrhage, hydrocephalus, extra-axial collection or mass lesion/mass effect. Periventricular white matter hypodensity. Vascular: No hyperdense vessel or unexpected calcification. Skull: Normal. Negative for fracture or focal lesion. Sinuses/Orbits: No acute finding. Other: None. IMPRESSION: No acute intracranial pathology. Small-vessel white matter disease in keeping with advanced patient age. Electronically Signed   By: Lauralyn PrimesAlex  Bibbey M.D.   On: 12/21/2018 21:16   Ct Lumbar Spine Wo Contrast  Result Date: 12/21/2018 CLINICAL DATA:  85 year old male with low back pain. Unable to walk due to pain. EXAM: CT LUMBAR SPINE WITHOUT CONTRAST TECHNIQUE: Multidetector CT imaging of the lumbar spine was performed without intravenous contrast administration. Multiplanar CT image reconstructions were also generated. COMPAR[facility]. FINDINGS: Segmentation: Normal. Alignment: Mildly increased reversal of lumbar lordosis at L2-L3 since the 2010 MRI. Subtle grade 1 anterolisthesis has developed at L4-L5. Stable lumbar lordosis elsewhere. Vertebrae: Osteopenia. Multilevel postoperative changes to the lumbar posterior elements are new since the 2010 MRI, detailed below. No superimposed No acute osseous abnormality identified. Paraspinal and other soft tissues: Calcified aortic atherosclerosis. Negative visible noncontrast abdominal viscera. Mild postoperative changes to the posterior paraspinal soft tissues. Disc levels: T11-T12: Possible interbody ankylosis due to anterior endplate osteophyte or syndesmophyte. T12-L1:  Mild disc bulge and anterior endplate spurring. L1-L2: Mild vacuum disc and far lateral disc bulge with endplate spurring. Mild posterior element hypertrophy. Mild spinal stenosis suspected and probably not significantly changed since 2010. Associated chronic left L1 foraminal stenosis. L2-L3:  Interval posterior decompression and interbody ankylosis. The severe spinal stenosis at this level in 2010 appears resolved. No definite foraminal stenosis. L3-L4: Vacuum disc with circumferential disc bulge. Endplate spurring and posterior element hypertrophy. Mild to moderate spinal and bilateral foraminal stenosis suspected at this level and increased since 2010. L4-L5: Posterior decompression at this level since 2010 with resolved spinal stenosis since that time. Circumferential disc bulge and residual facet hypertrophy. New mild anterolisthesis. Mild to moderate bilateral lateral recess and L4 foraminal stenosis persists. L5-S1: Previous posterior decompression is stable since 2010. Evidence of interbody ankylosis now. No definite stenosis. IMPRESSION: 1. Osteopenia with no acute osseous abnormality identified. Ankylosis of T11-T12, L2-L3, and L5-S1. 2. Resolved spinal stenosis at several levels following posterior decompression since the 2010 MRI. Increased degenerative changes and stenosis suspected at L3-L4. Stable stenosis suspected at L1-L2. 3.  Aortic Atherosclerosis (ICD10-I70.0). Electronically Signed   By: Odessa FlemingH  Hall M.D.   On: 12/21/2018 21:21   Ct Knee Right Wo Contrast  Result Date: 12/21/2018 CLINICAL DATA:  85 year old male with right knee swelling. EXAM: CT OF THE right KNEE WITHOUT CONTRAST TECHNIQUE: Multidetector CT imaging of the right knee was performed according to the standard protocol. Multiplanar CT image reconstructions were also generated. COMPARISON:  Right knee radiograph dated 12/21/2018 FINDINGS: Evaluation is limited due to advanced osteopenia. Bones/Joint/Cartilage No definite acute fracture identified. Evaluation however is very limited due to osteopenia. There is no dislocation. There is severe arthritic changes with tricompartmental narrowing and bone spurring. There is mid-distal chondrocalcinosis. There is a moderate suprapatellar effusion. There is thickening and  heterogeneity of the tissues surrounding the anterior and posterior cruciate ligaments. Small adjacent bone fragments posterior to the knee may represent loose body within the synovium or within a small Baker's cyst. Ligaments Suboptimally assessed by CT. Muscles and Tendons No acute finding. Moderate fatty atrophy. Soft tissues No acute  findings. Vascular calcifications noted. IMPRESSION: 1. No definite acute fracture or dislocation. 2. Severe arthritic changes of the knee with chondrocalcinosis and a moderate suprapatellar effusion. Electronically Signed   By: Anner Crete M.D.   On: 12/21/2018 21:23   Dg Knee Complete 4 Views Left  Result Date: 12/21/2018 CLINICAL DATA:  Fall EXAM: LEFT KNEE - COMPLETE 4+ VIEW COMPARISON:  11/28/2017 FINDINGS: No fracture or malalignment. Moderate severe arthritis involving the mediolateral and patellofemoral compartments. Joint space calcifications. Trace knee effusion. Vascular calcifications and surgical clips IMPRESSION: 1. No acute osseous abnormality 2. Tricompartment arthritis with trace knee effusion 3. Chondrocalcinosis Electronically Signed   By: Donavan Foil M.D.   On: 12/21/2018 19:57   Dg Knee Complete 4 Views Right  Result Date: 12/21/2018 CLINICAL DATA:  Fall with knee pain EXAM: RIGHT KNEE - COMPLETE 4+ VIEW COMPARISON:  None. FINDINGS: No acute displaced fracture or malalignment. Moderate severe tricompartment arthritis of the knee. Small knee effusion. Lateral joint space calcification. Vascular calcifications. IMPRESSION: 1. No acute osseous abnormality 2. Arthritis of the knee with small knee effusion 3. Chondrocalcinosis Electronically Signed   By: Donavan Foil M.D.   On: 12/21/2018 20:00    ____________________________________________  PROCEDURES   Procedure(s) performed (including Critical Care):  Procedures  ____________________________________________  INITIAL IMPRESSION / MDM / [place] / ED COURSE  As part of my  medical decision making, I reviewed the following data within the electronic MEDICAL RECORD NUMBER Notes from prior ED visits and Mark Dougherty      *Mark Dougherty was evaluated in Emergency Department on 12/22/2018 for the symptoms described in the history of present illness. He was evaluated in the context of the global COVID-19 pandemic, which necessitated consideration that the patient might be at risk for infection with the SARS-CoV-2 virus that causes COVID-19. Institutional protocols and algorithms that pertain to the evaluation of patients at risk for COVID-19 are in a state of rapid change based on information released by regulatory bodies including the CDC and federal and state organizations. These policies and algorithms were followed during the patient's care in the ED.  Some ED evaluations and interventions may be delayed as a result of limited staffing during the pandemic.*   Medical Decision Making:  85 yo M here with back pain, knee pain s/p fall and syncopal episode. Fortunately, CT and plain films show no signs of fx or traumatic injury. However, primary concern is syncopal episode leading to fall, with TWI note don EKG. No CP currently, no STEMI on EKG but will admit for high risk syncope, severe b/l knee pain likely 22 hemarthroses from fall on Eliquis.  ____________________________________________  FINAL CLINICAL IMPRESSION(S) / ED DIAGNOSES  Final diagnoses:  Acute pain of both knees  Syncope, unspecified syncope type     MEDICATIONS GIVEN DURING THIS VISIT:  Medications  simvastatin (ZOCOR) tablet 20 mg (has no administration in time range)  finasteride (PROSCAR) tablet 5 mg (has no administration in time range)  apixaban (ELIQUIS) tablet 5 mg (has no administration in time range)  acetaminophen (TYLENOL) tablet 650 mg (has no administration in time range)    Or  acetaminophen (TYLENOL) suppository 650 mg (has no administration in time range)    oxyCODONE (Oxy IR/ROXICODONE) immediate release tablet 5 mg (has no administration in time range)  ondansetron (ZOFRAN) tablet 4 mg (has no administration in time range)    Or  ondansetron (ZOFRAN) injection 4 mg (has no administration in time range)  insulin aspart (novoLOG) injection 0-9 Units (has no administration in time range)  insulin aspart (novoLOG) injection 0-5 Units (has no administration in time range)  morphine 2 MG/ML injection 2 mg (has no administration in time range)  fentaNYL (SUBLIMAZE) injection 25 mcg (25 mcg Intravenous Given 12/21/18 2030)     ED Discharge Orders    None       Note:  This document was prepared using Dragon voice recognition software and may include unintentional dictation errors.   Shaune Pollack, MD 12/22/18 [phone]

## 2018-12-22 ENCOUNTER — Inpatient Hospital Stay (HOSPITAL_COMMUNITY)
Admit: 2018-12-22 | Discharge: 2018-12-22 | Disposition: A | Discharge status: Home / Self Care | Payer: Medicare Other | Attending: Internal Medicine | Admitting: Internal Medicine

## 2018-12-22 DIAGNOSIS — R9431 Abnormal electrocardiogram [ECG] [EKG]: Secondary | ICD-10-CM

## 2018-12-22 DIAGNOSIS — I480 Paroxysmal atrial fibrillation: Secondary | ICD-10-CM

## 2018-12-22 LAB — GLUCOSE, CAPILLARY
Glucose-Capillary: 145 mg/dL — ABNORMAL HIGH (ref 70–99)
Glucose-Capillary: 153 mg/dL — ABNORMAL HIGH (ref 70–99)
Glucose-Capillary: 146 mg/dL — ABNORMAL HIGH (ref 70–99)
Glucose-Capillary: 102 mg/dL — ABNORMAL HIGH (ref 70–99)

## 2018-12-22 LAB — TROPONIN I (HIGH SENSITIVITY)
Troponin I (High Sensitivity): 289 ng/L (ref <18)
Troponin I (High Sensitivity): 234 ng/L (ref <18)
Troponin I (High Sensitivity): 216 ng/L (ref <18)

## 2018-12-22 LAB — BASIC METABOLIC PANEL
Anion gap: 10 (ref 5–15)
BUN: 24 mg/dL — ABNORMAL HIGH (ref 8–23)
CO2: 25 mmol/L (ref 22–32)
Calcium: 8.8 mg/dL — ABNORMAL LOW (ref 8.9–10.3)
Chloride: 101 mmol/L (ref 98–111)
Creatinine, Ser: 1.16 mg/dL (ref 0.61–1.24)
GFR calc Af Amer: >60 mL/min (ref >60)
GFR calc non Af Amer: 55 mL/min — ABNORMAL LOW (ref >60)
Glucose, Bld: 162 mg/dL — ABNORMAL HIGH (ref 70–99)
Potassium: 4.1 mmol/L (ref 3.5–5.1)
Sodium: 136 mmol/L (ref 135–145)

## 2018-12-22 LAB — SYNOVIAL CELL COUNT + DIFF, W/ CRYSTALS
Eosinophils-Synovial: 0 %
Lymphocytes-Synovial Fld: 4 %
Monocyte-Macrophage-Synovial Fluid: 12 %
Neutrophil, Synovial: 84 %
WBC, Synovial: 41678 /mm3 — ABNORMAL HIGH (ref 0–200)

## 2018-12-22 LAB — CBC
HCT: 35.1 % — ABNORMAL LOW (ref 39.0–52.0)
Hemoglobin: 11.6 g/dL — ABNORMAL LOW (ref 13.0–17.0)
MCH: 30.9 pg (ref 26.0–34.0)
MCHC: 33 g/dL (ref 30.0–36.0)
MCV: 93.4 fL (ref 80.0–100.0)
Platelets: 288 10*3/uL (ref 150–400)
RBC: 3.76 MIL/uL — ABNORMAL LOW (ref 4.22–5.81)
RDW: 12.4 % (ref 11.5–15.5)
WBC: 12.1 10*3/uL — ABNORMAL HIGH (ref 4.0–10.5)
nRBC: 0 % (ref 0.0–0.2)

## 2018-12-22 LAB — HEMOGLOBIN A1C
Hgb A1c MFr Bld: 7 % — ABNORMAL HIGH (ref 4.8–5.6)
Mean Plasma Glucose: 154.2 mg/dL

## 2018-12-22 LAB — ECHOCARDIOGRAM COMPLETE
Height: 69 in
Weight: 3425.07 oz

## 2018-12-22 LAB — SARS CORONAVIRUS 2 (TAT 6-24 HRS): SARS Coronavirus 2: NEGATIVE

## 2018-12-22 MED ORDER — ONDANSETRON HCL 4 MG PO TABS: 4.0000 mg | ORAL_TABLET | Freq: Four times a day (QID) | ORAL | Status: DC | PRN

## 2018-12-22 MED ORDER — IBUPROFEN 400 MG PO TABS: 400.0000 mg | ORAL_TABLET | Freq: Four times a day (QID) | ORAL | Status: DC | PRN

## 2018-12-22 MED ORDER — APIXABAN 5 MG PO TABS: 5.0000 mg | ORAL_TABLET | Freq: Two times a day (BID) | ORAL | Status: DC

## 2018-12-22 MED ORDER — OXYCODONE HCL 5 MG PO TABS
5.0000 mg | ORAL_TABLET | ORAL | Status: DC | PRN
  Administered 2018-12-23: 09:00:00 5 mg via ORAL
  Filled 2018-12-22: qty 1

## 2018-12-22 MED ORDER — TERAZOSIN HCL 1 MG PO CAPS
1.0000 mg | ORAL_CAPSULE | Freq: Every day | ORAL | Status: DC
  Filled 2018-12-22 (×2): qty 1

## 2018-12-22 MED ORDER — FOLIC ACID 1 MG PO TABS
1.0000 mg | ORAL_TABLET | Freq: Every day | ORAL | Status: DC
  Administered 2018-12-22 – 2018-12-23 (×2): 1 mg via ORAL
  Filled 2018-12-22 (×2): qty 1

## 2018-12-22 MED ORDER — IBUPROFEN 400 MG PO TABS: 400.0000 mg | ORAL_TABLET | Freq: Two times a day (BID) | ORAL | Status: DC

## 2018-12-22 MED ORDER — INSULIN ASPART 100 UNIT/ML ~~LOC~~ SOLN
0.0000 [IU] | Freq: Three times a day (TID) | SUBCUTANEOUS | Status: DC
  Administered 2018-12-22: 1 [IU] via SUBCUTANEOUS
  Administered 2018-12-22: 2 [IU] via SUBCUTANEOUS
  Administered 2018-12-22 – 2018-12-23 (×2): 1 [IU] via SUBCUTANEOUS
  Administered 2018-12-23: 2 [IU] via SUBCUTANEOUS
  Filled 2018-12-22 (×5): qty 1

## 2018-12-22 MED ORDER — APIXABAN 5 MG PO TABS
5.0000 mg | ORAL_TABLET | Freq: Two times a day (BID) | ORAL | Status: DC
  Administered 2018-12-22 – 2018-12-23 (×2): 5 mg via ORAL
  Filled 2018-12-22 (×2): qty 1

## 2018-12-22 MED ORDER — NAPROXEN 500 MG PO TABS
500.0000 mg | ORAL_TABLET | Freq: Two times a day (BID) | ORAL | Status: DC
  Administered 2018-12-22: 500 mg via ORAL
  Filled 2018-12-22: qty 1

## 2018-12-22 MED ORDER — COLCHICINE 0.6 MG PO TABS
0.6000 mg | ORAL_TABLET | Freq: Two times a day (BID) | ORAL | Status: DC
  Filled 2018-12-22: qty 1

## 2018-12-22 MED ORDER — SIMVASTATIN 20 MG PO TABS
20.0000 mg | ORAL_TABLET | Freq: Every day | ORAL | Status: DC
  Administered 2018-12-22: 20 mg via ORAL
  Filled 2018-12-22: qty 1

## 2018-12-22 MED ORDER — ACETAMINOPHEN 650 MG RE SUPP: 650.0000 mg | Freq: Four times a day (QID) | RECTAL | Status: DC | PRN

## 2018-12-22 MED ORDER — ONDANSETRON HCL 4 MG/2ML IJ SOLN: 4.0000 mg | Freq: Four times a day (QID) | INTRAMUSCULAR | Status: DC | PRN

## 2018-12-22 MED ORDER — FINASTERIDE 5 MG PO TABS
5.0000 mg | ORAL_TABLET | Freq: Every day | ORAL | Status: DC
  Administered 2018-12-22 – 2018-12-23 (×2): 5 mg via ORAL
  Filled 2018-12-22 (×2): qty 1

## 2018-12-22 MED ORDER — ACETAMINOPHEN 325 MG PO TABS: 650.0000 mg | ORAL_TABLET | Freq: Four times a day (QID) | ORAL | Status: DC | PRN

## 2018-12-22 MED ORDER — COLCHICINE 0.6 MG PO TABS
0.6000 mg | ORAL_TABLET | Freq: Two times a day (BID) | ORAL | Status: DC
  Administered 2018-12-23: 09:00:00 0.6 mg via ORAL
  Filled 2018-12-22: qty 1

## 2018-12-22 MED ORDER — INSULIN ASPART 100 UNIT/ML ~~LOC~~ SOLN: 0.0000 [IU] | Freq: Every day | SUBCUTANEOUS | Status: DC

## 2018-12-22 MED ORDER — LIDOCAINE-EPINEPHRINE 1 %-1:100000 IJ SOLN
10.0000 mL | Freq: Once | INTRAMUSCULAR | Status: DC
  Filled 2018-12-22: qty 10

## 2018-12-22 MED ORDER — COLCHICINE 0.6 MG PO TABS
1.2000 mg | ORAL_TABLET | Freq: Once | ORAL | Status: AC
  Administered 2018-12-22: 1.2 mg via ORAL
  Filled 2018-12-22: qty 2

## 2018-12-22 MED ORDER — MORPHINE SULFATE (PF) 2 MG/ML IV SOLN: 2.0000 mg | INTRAVENOUS | Status: DC | PRN

## 2018-12-22 NOTE — Consult Note (Addendum)
ORTHOPAEDIC CONSULTATION  REQUESTING PHYSICIAN: Shaune Pollack, MD  Chief Complaint:   R knee pain  History of Present Illness: Mark Dougherty is a 85 y.o. male who is having difficulty with weightbearing.  He states he has fallen twice recently, once on his right knee and once on his left knee. He fell on his right knee from a sitting position on 12/26/2018 and had aching and throbbing in his right knee.  The following evening, he noted he stood up and then passed out and landed on his left knee with some increasing pain on that side.  Since that time, he has had difficulty with weightbearing.  His right knee pain significantly worse.  Pain is described as sharp at its worst and a dull ache at its best.  Pain is rated a 10 out of 10 in severity.  Pain is improved with rest and immobilization.  Pain is worse with any sort of movement and attempt to flex or extend his knee.  Imaging did not show any acute fracture or dislocation.  There was no lipohemarthrosis suggestive of occult fracture.  Of note he is on Eliquis for atrial fibrillation.  He has no history of fevers, chills or signs suggestive of systemic infection.  Past Medical History:  Diagnosis Date  . A-fib (HCC)    a. in perioperative state-->converted to NSR on amiodarone  . Atrial flutter (HCC)    a. s/p DCCV 06/29/2015  . BPH (benign prostatic hyperplasia)   . CAD (coronary artery disease)    a. s/p CABG in 1996 in [place]  . Carotid artery stenosis    a. s/p left-sided CEA 2000; b. 02/2016 Carotid U/S: <39% bilat dzs.  . Chronic systolic CHF (congestive heart failure) (HCC)    a. echo 2011 with EF 40%  . Diabetes mellitus   . Hyperlipidemia   . Hypotension    mild  . Ischemic cardiomyopathy    a. 2011 Echo: Ef 40%.  . Left wrist fracture   . Peripheral neuropathy   . Spinal stenosis   . Thyroid disease    a. low TSH and elevated free T4 in June 2015, amiodarone was  stopped at that time   Past Surgical History:  Procedure Laterality Date  . BACK SURGERY  1975  . CARDIOVERSION N/A 08/22/2017   Procedure: CARDIOVERSION;  Surgeon: Antonieta Iba, MD;  Location: [facility] ORS;  Service: Cardiovascular;  Laterality: N/A;  . CAROTID ENDARTERECTOMY     2010  . CORONARY ARTERY BYPASS GRAFT  1996   x3  . ELECTROPHYSIOLOGIC STUDY N/A 06/29/2015   Procedure: Cardioversion;  Surgeon: Antonieta Iba, MD;  Location: [facility] ORS;  Service: Cardiovascular;  Laterality: N/A;  . EYE SURGERY  2013  . REPLACEMENT TOTAL KNEE     left  . TOOTH EXTRACTION     Social History   Socioeconomic History  . Marital status: Married    Spouse name: Not on file  . Number of children: Not on file  . Years of education: Not on file  . Highest education level: Not on file  Occupational History  . Not on file  Social Needs  . Financial resource strain: Not on file  . Food insecurity    Worry: Not on file    Inability: Not on file  . Transportation needs    Medical: Not on file    Non-medical: Not on file  Tobacco Use  . Smoking status: Never Smoker  . Smokeless tobacco: Former Neurosurgeon  Substance and Sexual Activity  . Alcohol use: No  . Drug use: No  . Sexual activity: Not Currently  Lifestyle  . Physical activity    Days per week: Not on file    Minutes per session: Not on file  . Stress: Not on file  Relationships  . Social Herbalist on phone: Not on file    Gets together: Not on file    Attends religious service: Not on file    Active member of club or organization: Not on file    Attends meetings of clubs or organizations: Not on file    Relationship status: Not on file  Other Topics Concern  . Not on file  Social History Narrative   Lives with wife in [place].  Does not routinely exercise.  More or less chair bound in setting of back and hip pain.   Family History  Family history unknown: Yes   No Known Allergies Prior to Admission medications    Medication Sig Start Date End Date Taking? Authorizing Provider  apixaban (ELIQUIS) 5 MG TABS tablet Take 1 tablet (5 mg total) by mouth 2 (two) times daily. 04/04/17  Yes Dunn, Areta Haber, PA-C  Cyanocobalamin (VITAMIN B-12 PO) Take 1 tablet by mouth daily.    Yes [provider]  folic acid (FOLVITE) 1 MG tablet Take 1 mg by mouth daily.     Yes [provider]  HYDROcodone-acetaminophen (NORCO/VICODIN) 5-325 MG tablet Take 1 tablet by mouth every 6 (six) hours as needed for moderate pain. 10/02/18  Yes Mcarthur Rossetti, MD  metFORMIN (GLUCOPHAGE) 500 MG tablet Take 500 mg by mouth 2 (two) times daily with a meal.     Yes [provider]  simvastatin (ZOCOR) 20 MG tablet TAKE ONE TABLET BY MOUTH EVERY NIGHT AT BEDTIME Patient taking differently: TAKE 20 MG BY MOUTH EVERY NIGHT AT BEDTIME 02/04/17  Yes Gollan, Kathlene November, MD  terazosin (HYTRIN) 1 MG capsule Take 1 mg by mouth at bedtime.   Yes [provider]  Tetrahyd-Glyc-Hypro-PEG-ZnSulf (VISINE TOTALITY MULTI-SYMPTOM OP) Place 2 drops into both eyes daily as needed (for dry eyes).   Yes [provider]  diclofenac sodium (VOLTAREN) 1 % GEL Apply 4 g topically 4 (four) times daily. Patient not taking: Reported on 12/21/2018 08/21/18   Sherrie George B, FNP  finasteride (PROSCAR) 5 MG tablet Take 5 mg by mouth daily.    [provider]  magnesium oxide (MAG-OX) 400 MG tablet Take 1 tablet (400 mg total) by mouth daily. Patient not taking: Reported on 12/21/2018 01/03/17   Rise Mu, PA-C  Multiple Vitamins-Minerals (CENTRUM PO) Take 1 tablet by mouth daily.    [provider]  nystatin (MYCOSTATIN/NYSTOP) powder Apply topically 4 (four) times daily. Patient taking differently: Apply 1 g topically 4 (four) times daily as needed (for rash).  01/05/17   Lisa Roca, MD   Recent Labs    12/21/18 2030 12/22/18 0446  WBC 10.7* 12.1*  HGB 12.3* 11.6*  HCT 37.9* 35.1*  PLT 293 288  K  4.3 4.1  CL 98 101  CO2 24 25  BUN 24* 24*  CREATININE 1.22 1.16  GLUCOSE 164* 162*  CALCIUM 8.9 8.8*   Ct Head Wo Contrast  Result Date: 12/21/2018 CLINICAL DATA:  Headache EXAM: CT HEAD WITHOUT CONTRAST TECHNIQUE: Contiguous axial images were obtained from the base of the skull through the vertex without intravenous contrast. COMPARISON:  11/29/2016 FINDINGS: Brain: No  evidence of acute infarction, hemorrhage, hydrocephalus, extra-axial collection or mass lesion/mass effect. Periventricular white matter hypodensity. Vascular: No hyperdense vessel or unexpected calcification. Skull: Normal. Negative for fracture or focal lesion. Sinuses/Orbits: No acute finding. Other: None. IMPRESSION: No acute intracranial pathology. Small-vessel white matter disease in keeping with advanced patient age. Electronically Signed   By: Lauralyn PrimesAlex  Bibbey M.D.   On: 12/21/2018 21:16   Ct Lumbar Spine Wo Contrast  Result Date: 12/21/2018 CLINICAL DATA:  85 year old male with low back pain. Unable to walk due to pain. EXAM: CT LUMBAR SPINE WITHOUT CONTRAST TECHNIQUE: Multidetector CT imaging of the lumbar spine was performed without intravenous contrast administration. Multiplanar CT image reconstructions were also generated. COMPAR[facility]/28/2009. FINDINGS: Segmentation: Normal. Alignment: Mildly increased reversal of lumbar lordosis at L2-L3 since the 2010 MRI. Subtle grade 1 anterolisthesis has developed at L4-L5. Stable lumbar lordosis elsewhere. Vertebrae: Osteopenia. Multilevel postoperative changes to the lumbar posterior elements are new since the 2010 MRI, detailed below. No superimposed No acute osseous abnormality identified. Paraspinal and other soft tissues: Calcified aortic atherosclerosis. Negative visible noncontrast abdominal viscera. Mild postoperative changes to the posterior paraspinal soft tissues. Disc levels: T11-T12: Possible interbody ankylosis due to  anterior endplate osteophyte or syndesmophyte. T12-L1:  Mild disc bulge and anterior endplate spurring. L1-L2: Mild vacuum disc and far lateral disc bulge with endplate spurring. Mild posterior element hypertrophy. Mild spinal stenosis suspected and probably not significantly changed since 2010. Associated chronic left L1 foraminal stenosis. L2-L3: Interval posterior decompression and interbody ankylosis. The severe spinal stenosis at this level in 2010 appears resolved. No definite foraminal stenosis. L3-L4: Vacuum disc with circumferential disc bulge. Endplate spurring and posterior element hypertrophy. Mild to moderate spinal and bilateral foraminal stenosis suspected at this level and increased since 2010. L4-L5: Posterior decompression at this level since 2010 with resolved spinal stenosis since that time. Circumferential disc bulge and residual facet hypertrophy. New mild anterolisthesis. Mild to moderate bilateral lateral recess and L4 foraminal stenosis persists. L5-S1: Previous posterior decompression is stable since 2010. Evidence of interbody ankylosis now. No definite stenosis. IMPRESSION: 1. Osteopenia with no acute osseous abnormality identified. Ankylosis of T11-T12, L2-L3, and L5-S1. 2. Resolved spinal stenosis at several levels following posterior decompression since the 2010 MRI. Increased degenerative changes and stenosis suspected at L3-L4. Stable stenosis suspected at L1-L2. 3.  Aortic Atherosclerosis (ICD10-I70.0). Electronically Signed   By: Odessa FlemingH  Hall M.D.   On: 12/21/2018 21:21   Ct Knee Right Wo Contrast  Result Date: 12/21/2018 CLINICAL DATA:  85 year old male with right knee swelling. EXAM: CT OF THE right KNEE WITHOUT CONTRAST TECHNIQUE: Multidetector CT imaging of the right knee was performed according to the standard protocol. Multiplanar CT image reconstructions were also generated. COMPARISON:  Right knee radiograph dated 12/21/2018 FINDINGS: Evaluation is limited due to advanced  osteopenia. Bones/Joint/Cartilage No definite acute fracture identified. Evaluation however is very limited due to osteopenia. There is no dislocation. There is severe arthritic changes with tricompartmental narrowing and bone spurring. There is mid-distal chondrocalcinosis. There is a moderate suprapatellar effusion. There is thickening and heterogeneity of the tissues surrounding the anterior and posterior cruciate ligaments. Small adjacent bone fragments posterior to the knee may represent loose body within the synovium or within a small Baker's cyst. Ligaments Suboptimally assessed by CT. Muscles and Tendons No acute finding. Moderate fatty atrophy. Soft tissues No acute findings. Vascular calcifications noted. IMPRESSION: 1. No definite acute fracture or dislocation. 2. Severe arthritic changes of the knee  with chondrocalcinosis and a moderate suprapatellar effusion. Electronically Signed   By: Elgie CollardArash  Radparvar M.D.   On: 12/21/2018 21:23   Dg Knee Complete 4 Views Left  Result Date: 12/21/2018 CLINICAL DATA:  Fall EXAM: LEFT KNEE - COMPLETE 4+ VIEW COMPARISON:  11/28/2017 FINDINGS: No fracture or malalignment. Moderate severe arthritis involving the mediolateral and patellofemoral compartments. Joint space calcifications. Trace knee effusion. Vascular calcifications and surgical clips IMPRESSION: 1. No acute osseous abnormality 2. Tricompartment arthritis with trace knee effusion 3. Chondrocalcinosis Electronically Signed   By: Jasmine PangKim  Fujinaga M.D.   On: 12/21/2018 19:57   Dg Knee Complete 4 Views Right  Result Date: 12/21/2018 CLINICAL DATA:  Fall with knee pain EXAM: RIGHT KNEE - COMPLETE 4+ VIEW COMPARISON:  None. FINDINGS: No acute displaced fracture or malalignment. Moderate severe tricompartment arthritis of the knee. Small knee effusion. Lateral joint space calcification. Vascular calcifications. IMPRESSION: 1. No acute osseous abnormality 2. Arthritis of the knee with small knee effusion 3.  Chondrocalcinosis Electronically Signed   By: Jasmine PangKim  Fujinaga M.D.   On: 12/21/2018 20:00     Positive ROS: All other systems have been reviewed and were otherwise negative with the exception of those mentioned in the HPI and as above.  Physical Exam: BP 113/72 (BP Location: Left Arm)   Pulse 91   Temp 98.4 F (36.9 C) (Oral)   Resp 18   Ht 5\' 9"  (1.753 m)   Wt 97.1 kg   SpO2 97%   BMI 31.61 kg/m  General:  Alert, no acute distress Psychiatric:  Patient is competent for consent with normal mood and affect   Cardiovascular:  No pedal edema, regular rate and rhythm Respiratory:  No wheezing, non-labored breathing GI:  Abdomen is soft and non-tender Skin:  No lesions in the area of chief complaint, no erythema Neurologic:  Sensation intact distally, CN grossly intact Lymphatic:  No axillary or cervical lymphadenopathy  Orthopedic Exam:  RLE: 5/5 DF/PF/EHL SILT s/s/t/sp/dp distr Foot wwp Knee resting in ~30 deg flexion. PROM: 20-50 knee flexion. Effusion present Diffuse tenderness about medial and lateral joint lines and retropatellar region   [place]:  As above: significant degenerative changes to bilateral knees with effusion present on R side. Chondrocalcinosis also present bilaterally. No lipohemarthrosis present.  Assessment/Plan: Mark Dougherty is a 85 y.o. male with a R knee pain with effusion and limited RoM. Concern for pseudogout/gout given chondrocalcinosis and limited RoM vs hemarthrosis from fall and anticoagulation vs septic knee which would be less likely.    1. I discussed that a right knee joint aspiration could serve both therapeutic and diagnostic purposes, after discussion of risk, benefits, and alternatives, the patient agreed to proceed.  Please see procedure note below for further details.  2.  Can go ahead and treat with NSAIDs such as naproxen 500 mg twice daily if no other contraindication.  3.  We will plan to leave further recommendations after  synovial fluid results are obtained.  Procedure Note - Right Knee Aspiration I reviewed with the patient the procedure of L knee joint aspiration and discussed the risks, benefits, and alternative treatments. We discussed the potential risks of infection, increased pain, and incomplete relief or temporary relief of symptoms. Verbal consent was obtained.  A time-out was conducted to verify correct patient identity, procedure to be performed, and correct side and site. The skin was marked and then prepped in usual sterile fashion. Superolateral approach was used, and I obtained ~15cc of bloody synovial fluid.  There were no signs of gross infection and there was no sign of lipid droplets.  The patient tolerated the procedure adequately. Aspiration sent for cell count, culture/gram stain, and crystals.     ADDENDUM: Synovial fluid results obtained and are as indicated below:  Component     Latest Ref Rng & Units 12/22/2018  Color, Synovial     YELLOW RED (A)  Appearance-Synovial     CLEAR TURBID (A)  Crystals, Fluid      EXTRACELLULAR CALCIUM PYROPHOSPHATE CRYSTALS  WBC, Synovial     0 - 200 /cu mm 41,678 (H)  Neutrophil, Synovial     % 84  Lymphocytes-Synovial Fld     % 4  Monocyte-Macrophage-Synovial Fluid     % 12  Eosinophils-Synovial     % 0    I recommend treating for pseudogout.  He can also get Tylenol 1000 mg every 8 hours scheduled to help with pain.  No plan for OR currently.  Recommend PT/OT.  He can be weightbearing as tolerated on bilateral lower extremities.

## 2018-12-22 NOTE — Consult Note (Signed)
Cardiology Consultation:   Patient ID: Mark Dougherty MRN: [id number]; DOB: 10/10/1926  Admit date: 12/21/2018 Date of Consult: 12/22/2018  Primary Care Provider: Jaclyn Shaggyate, Denny C, MD Primary Cardiologist:Dr. Mariah MillingGollan Primary Electrophysiologist:  None    Patient Profile:   Mark Dougherty is a 85 y.o. 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 Anne HahnWillis.  History of Present Illness:   Mark Dougherty is a 85 year old male with PMH as above.  He has a history of leg weakness and falls with known history of spinal stenosis and chronic back and leg pain (s/p back surgery ~2015), and PAD.  He is known to be very sedentary and has a history of falls with last fall documented in March 2020.  He usually gets around with a power mobility device but has known limitations to his mobility. He is reportedly SOB at baseline. He has known carotid artery disease and mild to moderate atrial stenosis.  In approximately 2015, he underwent back surgery with complications including atrial fibrillation and flutter in the perioperative / postoperative period.  He was started on amiodarone with conversion to sinus rhythm.  He underwent cardioversion 06/2015. Event monitor in 2018 showed SR with one run of NSVT of 9 beats.   [facility] department on 12/21/2018 and after he had a mechanical fall on the way to the restroom.  He reported that he reached out in order to catch himself; however, he landed on his right knee.  He subsequently was in intense pain and blacked out for  approximately 4 minutes.  When he was more conscious, he yelled out to his wife for help, as she sleeps in a separate bedroom.  He denies any symptoms consistent with presyncope prior to his fall.  He denies any chest pain, palpitations, or racing heart rate.  He reports no increasing shortness of breath beyond that of his baseline.  Of note, he did report a fall earlier this month onto his left knee.  In the emergency department, EKG was significant for SR, 92bpm, LAD with known left anterior fascicular block. No acute ST or T changes from baseline.  High-sensitivity troponin peaked has been flat trending and peaked at 309.  Orthopedics consulted with concern for R knee gout and planning to aspirate R knee. Cardiology consulted for new LAFB; however, this is a known EKG finding.   Heart Pathway Score:     Past Medical History:  Diagnosis Date   A-fib [facility](HCC)    a. in perioperative state-->converted to NSR on amiodarone   Atrial flutter (HCC)    a. s/p DCCV 06/29/2015   BPH (benign prostatic hyperplasia)    CAD (coronary artery disease)    a. s/p CABG in [place] in [place]   Carotid artery stenosis    a. s/p left-sided CEA 2000; b. 02/2016 Carotid U/S: <39% bilat dzs.   Chronic systolic CHF (congestive heart failure) (HCC)    a. echo 2011 with EF 40%   Diabetes mellitus    Hyperlipidemia    Hypotension    mild   Ischemic  cardiomyopathy    a. 2011 Echo: Ef 40%.   Left wrist fracture    Peripheral neuropathy    Spinal stenosis    Thyroid disease    a. low TSH and elevated free T4 in June 2015, amiodarone was stopped at that time    Past Surgical History:  Procedure Laterality Date   BACK SURGERY  1975   CARDIOVERSION N/A 08/22/2017   Procedure: CARDIOVERSION;  Surgeon: Antonieta Iba, MD;  Location: [facility] ORS;  Service: Cardiovascular;  Laterality: N/A;   CAROTID ENDARTERECTOMY     2010   CORONARY ARTERY BYPASS GRAFT  1996   x3   ELECTROPHYSIOLOGIC STUDY N/A 06/29/2015    Procedure: Cardioversion;  Surgeon: Antonieta Iba, MD;  Location: [facility] ORS;  Service: Cardiovascular;  Laterality: N/A;   EYE SURGERY  2013   REPLACEMENT TOTAL KNEE     left   TOOTH EXTRACTION       Home Medications:  Prior to Admission medications   Medication Sig Start Date End Date Taking? Authorizing Provider  apixaban (ELIQUIS) 5 MG TABS tablet Take 1 tablet (5 mg total) by mouth 2 (two) times daily. 04/04/17  Yes Dunn, Raymon Mutton, PA-C  Cyanocobalamin (VITAMIN B-12 PO) Take 1 tablet by mouth daily.    Yes [provider]  folic acid (FOLVITE) 1 MG tablet Take 1 mg by mouth daily.     Yes [provider]  HYDROcodone-acetaminophen (NORCO/VICODIN) 5-325 MG tablet Take 1 tablet by mouth every 6 (six) hours as needed for moderate pain. 10/02/18  Yes Kathryne Hitch, MD  metFORMIN (GLUCOPHAGE) 500 MG tablet Take 500 mg by mouth 2 (two) times daily with a meal.     Yes [provider]  simvastatin (ZOCOR) 20 MG tablet TAKE ONE TABLET BY MOUTH EVERY NIGHT AT BEDTIME Patient taking differently: TAKE 20 MG BY MOUTH EVERY NIGHT AT BEDTIME 02/04/17  Yes Gollan, Tollie Pizza, MD  terazosin (HYTRIN) 1 MG capsule Take 1 mg by mouth at bedtime.   Yes [provider]  Tetrahyd-Glyc-Hypro-PEG-ZnSulf (VISINE TOTALITY MULTI-SYMPTOM OP) Place 2 drops into both eyes daily as needed (for dry eyes).   Yes [provider]  diclofenac sodium (VOLTAREN) 1 % GEL Apply 4 g topically 4 (four) times daily. Patient not taking: Reported on 12/21/2018 08/21/18   Kem Boroughs B, FNP  finasteride (PROSCAR) 5 MG tablet Take 5 mg by mouth daily.    [provider]  magnesium oxide (MAG-OX) 400 MG tablet Take 1 tablet (400 mg total) by mouth daily. Patient not taking: Reported on 12/21/2018 01/03/17   Sondra Barges, PA-C  Multiple Vitamins-Minerals (CENTRUM PO) Take 1 tablet by mouth daily.    [provider]  nystatin (MYCOSTATIN/NYSTOP) powder Apply topically  4 (four) times daily. Patient taking differently: Apply 1 g topically 4 (four) times daily as needed (for rash).  01/05/17   Governor Rooks, MD    Inpatient Medications: Scheduled Meds:  finasteride  5 mg Oral Daily   insulin aspart  0-5 Units Subcutaneous QHS   insulin aspart  0-9 Units Subcutaneous TID WC   lidocaine-EPINEPHrine  10 mL Other Once   simvastatin  20 mg Oral QHS   Continuous Infusions:  PRN Meds: acetaminophen **OR** acetaminophen, ibuprofen, morphine injection, ondansetron **OR** ondansetron (ZOFRAN) IV, oxyCODONE  Allergies:   No Known Allergies  Social History:   Social History   Socioeconomic History   Marital status: Married    Spouse name: Not on file  Number of children: Not on file   Years of education: Not on file   Highest education level: Not on file  Occupational History   Not on file  Social Needs   Financial resource strain: Not on file   Food insecurity    Worry: Not on file    Inability: Not on file   Transportation needs    Medical: Not on file    Non-medical: Not on file  Tobacco Use   Smoking status: Never Smoker   Smokeless tobacco: Former NeurosurgeonUser  Substance and Sexual Activity   Alcohol use: No   Drug use: No   Sexual activity: Not Currently  Lifestyle   Physical activity    Days per week: Not on file    Minutes per session: Not on file   Stress: Not on file  Relationships   Social connections    Talks on phone: Not on file    Gets together: Not on file    Attends religious service: Not on file    Active member of club or organization: Not on file    Attends meetings of clubs or organizations: Not on file    Relationship status: Not on file   Intimate partner violence    Fear of current or ex partner: Not on file    Emotionally abused: Not on file    Physically abused: Not on file    Forced sexual activity: Not on file  Other Topics Concern   Not on file  Social History Narrative   Lives with wife  in [place].  Does not routinely exercise.  More or less chair bound in setting of back and hip pain.    Family History:    Family History  Family history unknown: Yes     ROS:  Please see the history of present illness.  Review of Systems  Constitutional: Negative for chills and fever.  Respiratory: Positive for shortness of breath. Negative for cough and hemoptysis.        Baseline, chronic  Cardiovascular: Negative for chest pain and palpitations.  Genitourinary: Negative for hematuria.  Musculoskeletal: Positive for back pain, falls, joint pain and myalgias.  Neurological: Positive for loss of consciousness. Negative for dizziness and focal weakness.       LOC after fall and from pain    All other ROS reviewed and negative.     Physical Exam/Data:   Vitals:   12/21/18 2335 12/22/18 0000 12/22/18 0100 12/22/18 0744  BP: (!) 165/79 136/83 (!) 141/56 113/72  Pulse: 96  98 91  Resp:   18 18  Temp:   98.1 F (36.7 C) 98.4 F (36.9 C)  TempSrc:   Oral Oral  SpO2: 97%  98% 97%  Weight:      Height:        Intake/Output Summary (Last 24 hours) at 12/22/2018 1313 Last data filed at 12/22/2018 1200 Gross per 24 hour  Intake 0 ml  Output 100 ml  Net -100 ml   Last 3 Weights 12/21/2018 08/21/2018 09/12/2017  Weight (lbs) 214 lb 1.1 oz 214 lb 215 lb 8 oz  Weight (kg) 97.1 kg 97.07 kg 97.75 kg     Body mass index is 31.61 kg/m.  General: Elderly male in no acute distress HEENT: normal Lymph: no adenopathy Neck: no JVD Vascular: No carotid bruits; radial pulses 2+ bilaterally Cardiac: soft heart sounds, 2/6 systolic murmur Lungs:  expiratory wheezing, no rhonchi or rales  Abd: soft, nontender, no hepatomegaly  Ext: RLE in setting of possible knee gout > LLE Musculoskeletal:  Strength not assessed Skin: warm and dry  Neuro:  no focal abnormalities noted Psych:  Normal affect   EKG:  The EKG was personally reviewed and demonstrates:  SR, 92bpm, LAD with known left anterior  fascicular block. No acute ST or T changes from baseline.   Telemetry:  Telemetry was personally reviewed and demonstrates:  SR, occasional ectopy  Relevant CV Studies: 12/2016 Monitor Normal sinus rhythm with PVCs, One run of NSVT 9 beats (no symptoms) No other significant  arrhythmia noted  Signed, Esmond Plants, MD, Ph.D [facility]    11/30/2016 Study Conclusions - Procedure narrative: Transthoracic echocardiography. The study   was technically difficult. - Left ventricle: The cavity size was normal. Systolic function was   normal. The estimated ejection fraction was in the range of 55%   to 60%. Wall motion was normal; there were no regional wall   motion abnormalities. Doppler parameters are consistent with   abnormal left ventricular relaxation (grade 1 diastolic   dysfunction). - Aortic valve: severely calcified leaflets. There was mild to   moderate stenosis by estimated AVA. Stenosis is borderline mild   by mean gradient and peak velocity. Valve area (VTI): 1.32 cm^2. - Left atrium: The atrium was mildly dilated. - Right ventricle: Systolic function was normal. - Pulmonary arteries: Systolic pressure was within the normal   range.  Laboratory Data:  High Sensitivity Troponin:   Recent Labs  Lab 12/21/18 2255 12/22/18 0040 12/22/18 0811 12/22/18 1019  TROPONINIHS 307* 289* 234* 216*     Cardiac EnzymesNo results for input(s): TROPONINI in the last 168 hours. No results for input(s): TROPIPOC in the last 168 hours.  Chemistry Recent Labs  Lab 12/21/18 2030 12/22/18 0446  NA 134* 136  K 4.3 4.1  CL 98 101  CO2 24 25  GLUCOSE 164* 162*  BUN 24* 24*  CREATININE 1.22 1.16  CALCIUM 8.9 8.8*  GFRNONAA 52* 55*  GFRAA >60 >60  ANIONGAP 12 10    Recent Labs  Lab 12/21/18 2030  PROT 7.5  ALBUMIN 3.3*  AST 25  ALT 10  ALKPHOS 84  BILITOT 0.9   Hematology Recent Labs  Lab 12/21/18 2030 12/22/18 0446  WBC 10.7* 12.1*  RBC 4.01* 3.76*  HGB  12.3* 11.6*  HCT 37.9* 35.1*  MCV 94.5 93.4  MCH 30.7 30.9  MCHC 32.5 33.0  RDW 12.4 12.4  PLT 293 288   BNPNo results for input(s): BNP, PROBNP in the last 168 hours.  DDimer No results for input(s): DDIMER in the last 168 hours.   Radiology/Studies:  Ct Head Wo Contrast  Result Date: 12/21/2018 CLINICAL DATA:  Headache EXAM: CT HEAD WITHOUT CONTRAST TECHNIQUE: Contiguous axial images were obtained from the base of the skull through the vertex without intravenous contrast. COMPARISON:  11/29/2016 FINDINGS: Brain: No evidence of acute infarction, hemorrhage, hydrocephalus, extra-axial collection or mass lesion/mass effect. Periventricular white matter hypodensity. Vascular: No hyperdense vessel or unexpected calcification. Skull: Normal. Negative for fracture or focal lesion. Sinuses/Orbits: No acute finding. Other: None. IMPRESSION: No acute intracranial pathology. Small-vessel white matter disease in keeping with advanced patient age. Electronically Signed   By: Eddie Candle M.D.   On: 12/21/2018 21:16   Ct Lumbar Spine Wo Contrast  Result Date: 12/21/2018 CLINICAL DATA:  85 year old male with low back pain. Unable to walk due to pain. EXAM: CT LUMBAR SPINE WITHOUT CONTRAST TECHNIQUE: Multidetector CT imaging of the lumbar  spine was performed without intravenous contrast administration. Multiplanar CT image reconstructions were also generated. COMPARISON:  [facility] lumbar MRI 04/28/2009. FINDINGS: Segmentation: Normal. Alignment: Mildly increased reversal of lumbar lordosis at L2-L3 since the 2010 MRI. Subtle grade 1 anterolisthesis has developed at L4-L5. Stable lumbar lordosis elsewhere. Vertebrae: Osteopenia. Multilevel postoperative changes to the lumbar posterior elements are new since the 2010 MRI, detailed below. No superimposed No acute osseous abnormality identified. Paraspinal and other soft tissues: Calcified aortic atherosclerosis. Negative visible  noncontrast abdominal viscera. Mild postoperative changes to the posterior paraspinal soft tissues. Disc levels: T11-T12: Possible interbody ankylosis due to anterior endplate osteophyte or syndesmophyte. T12-L1:  Mild disc bulge and anterior endplate spurring. L1-L2: Mild vacuum disc and far lateral disc bulge with endplate spurring. Mild posterior element hypertrophy. Mild spinal stenosis suspected and probably not significantly changed since 2010. Associated chronic left L1 foraminal stenosis. L2-L3: Interval posterior decompression and interbody ankylosis. The severe spinal stenosis at this level in 2010 appears resolved. No definite foraminal stenosis. L3-L4: Vacuum disc with circumferential disc bulge. Endplate spurring and posterior element hypertrophy. Mild to moderate spinal and bilateral foraminal stenosis suspected at this level and increased since 2010. L4-L5: Posterior decompression at this level since 2010 with resolved spinal stenosis since that time. Circumferential disc bulge and residual facet hypertrophy. New mild anterolisthesis. Mild to moderate bilateral lateral recess and L4 foraminal stenosis persists. L5-S1: Previous posterior decompression is stable since 2010. Evidence of interbody ankylosis now. No definite stenosis. IMPRESSION: 1. Osteopenia with no acute osseous abnormality identified. Ankylosis of T11-T12, L2-L3, and L5-S1. 2. Resolved spinal stenosis at several levels following posterior decompression since the 2010 MRI. Increased degenerative changes and stenosis suspected at L3-L4. Stable stenosis suspected at L1-L2. 3.  Aortic Atherosclerosis (ICD10-I70.0). Electronically Signed   By: Odessa FlemingH  Hall M.D.   On: 12/21/2018 21:21   Ct Knee Right Wo Contrast  Result Date: 12/21/2018 CLINICAL DATA:  85 year old male with right knee swelling. EXAM: CT OF THE right KNEE WITHOUT CONTRAST TECHNIQUE: Multidetector CT imaging of the right knee was performed according to the standard protocol.  Multiplanar CT image reconstructions were also generated. COMPARISON:  Right knee radiograph dated 12/21/2018 FINDINGS: Evaluation is limited due to advanced osteopenia. Bones/Joint/Cartilage No definite acute fracture identified. Evaluation however is very limited due to osteopenia. There is no dislocation. There is severe arthritic changes with tricompartmental narrowing and bone spurring. There is mid-distal chondrocalcinosis. There is a moderate suprapatellar effusion. There is thickening and heterogeneity of the tissues surrounding the anterior and posterior cruciate ligaments. Small adjacent bone fragments posterior to the knee may represent loose body within the synovium or within a small Baker's cyst. Ligaments Suboptimally assessed by CT. Muscles and Tendons No acute finding. Moderate fatty atrophy. Soft tissues No acute findings. Vascular calcifications noted. IMPRESSION: 1. No definite acute fracture or dislocation. 2. Severe arthritic changes of the knee with chondrocalcinosis and a moderate suprapatellar effusion. Electronically Signed   By: Elgie CollardArash  Radparvar M.D.   On: 12/21/2018 21:23   Dg Knee Complete 4 Views Left  Result Date: 12/21/2018 CLINICAL DATA:  Fall EXAM: LEFT KNEE - COMPLETE 4+ VIEW COMPARISON:  11/28/2017 FINDINGS: No fracture or malalignment. Moderate severe arthritis involving the mediolateral and patellofemoral compartments. Joint space calcifications. Trace knee effusion. Vascular calcifications and surgical clips IMPRESSION: 1. No acute osseous abnormality 2. Tricompartment arthritis with trace knee effusion 3. Chondrocalcinosis Electronically Signed   By: Jasmine PangKim  Fujinaga M.D.   On: 12/21/2018 19:57   Dg Knee  Complete 4 Views Right  Result Date: 12/21/2018 CLINICAL DATA:  Fall with knee pain EXAM: RIGHT KNEE - COMPLETE 4+ VIEW COMPARISON:  None. FINDINGS: No acute displaced fracture or malalignment. Moderate severe tricompartment arthritis of the knee. Small knee effusion.  Lateral joint space calcification. Vascular calcifications. IMPRESSION: 1. No acute osseous abnormality 2. Arthritis of the knee with small knee effusion 3. Chondrocalcinosis Electronically Signed   By: Jasmine Pang M.D.   On: 12/21/2018 20:00    Assessment and Plan:   Mechanical fall --Reported tripped while on way to restroom. Tried to catch himself but landed on his knee, at which time the pain made his black out for several minutes. --EKG shows known LAFB and no acute changes from previous. Telemetry without arrhythmia. HS Tn flat trending, minimal elevation. --Sedentary lifestyle. H/o falls and mobility issues. Risks v benefits of ongoing anticoagulation to be discussed by primary cardiologist.  --Continue medical management. No further cardiology workup.  History of CAD s/p CABG ([place], [place]) --No chest pain. EKG without acute ST/T changes. High sensitivity Tn flat trending and not consistent with ACS. No further ischemic workup at this time  Atrial fibrillation/flutter --Maintaining SR since DCCV 06/2015 with 2018 holter monitor showing brief run of NSVT but no recurrent atrial fibrillation. EKGs and telemetry since admission without atrial fibrillation. Continue medical management.  Aortic stenosis --Agree with updated echo to monitor valvular disease  Vascular disease: PAD, Carotid disease --S/p CEA on the L, 2000. R. Previous carotid ultrasound less than 49% proximal R carotid disease. Vertebral arteries patent.      For questions or updates, please contact [facility] Please consult [email or web address] for contact info under     Signed, Lennon Alstrom, PA-C  12/22/2018 1:13 PM

## 2018-12-22 NOTE — Plan of Care (Signed)

## 2018-12-22 NOTE — Progress Notes (Signed)
OT Cancellation Note  Patient Details Name: Mark Dougherty MRN: [id number] DOB: 1926/07/05   Cancelled Treatment:    Reason Eval/Treat Not Completed: Other (comment). Consult received, chart reviewed. Ortho note pending. Per PT, RN reports pt's knee likely to be aspirated per Dr. Leim Fabry. Will hold OT evauation until later date/time as medically appropriate pending updated plan of care.  Jeni Salles, MPH, MS, OTR/L ascom [phone] 12/22/18, 11:37 AM

## 2018-12-22 NOTE — Progress Notes (Signed)
Advanced Care Plan.  Purpose of Encounter: CODE STATUS. Parties in Attendance: The patient and me. Patient's Decisional Capacity: Yes. Medical Story: Mark Dougherty  is a 85 y.o. male with history of A. fib, CAD, carotid arterial stenosis, CHF, BPH, hypertension, diabetes, etc.  The patient is admitted for recurrent fall and bilateral knee injury with possible hematoma.  I discussed with patient about his current condition, prognosis and CODE STATUS.  The patient hesitated to answer the questions about resuscitation or DNR.  I asked him several times, he still hesitated to decide. Plan:  Code Status: Full code for now. Time spent discussing advance care planning: 20 minutes.

## 2018-12-22 NOTE — Progress Notes (Signed)
OT Cancellation Note  Patient Details Name: Mark Dougherty MRN: [id number] DOB: 09-Dec-1926   Cancelled Treatment:    Reason Eval/Treat Not Completed: Other (comment). Pt still pending note from ortho consult and updated plan of care. Will re-attempt next date as appropriate.   Jeni Salles, MPH, MS, OTR/L ascom [phone] 12/22/18, 4:19 PM

## 2018-12-22 NOTE — Progress Notes (Signed)
*  PRELIMINARY RESULTS* Echocardiogram 2D Echocardiogram has been performed.  Mark Dougherty 12/22/2018, 9:30 AM

## 2018-12-22 NOTE — Progress Notes (Addendum)
[place] [facility] [place] NAME: Mark Dougherty    MR#:  [id number]  DATE OF BIRTH:  [birth year]  SUBJECTIVE:  CHIEF COMPLAINT:   Chief Complaint  Patient presents with   Knee Pain   The patient still has bilateral knee pain and swelling. REVIEW OF SYSTEMS:  Review of Systems  Constitutional: Negative for chills, fever and malaise/fatigue.  HENT: Negative for sore throat.   Eyes: Negative for blurred vision and double vision.  Respiratory: Negative for cough, hemoptysis, shortness of breath, wheezing and stridor.   Cardiovascular: Negative for chest pain, palpitations, orthopnea and leg swelling.  Gastrointestinal: Negative for abdominal pain, blood in stool, diarrhea, melena, nausea and vomiting.  Genitourinary: Negative for dysuria, flank pain and hematuria.  Musculoskeletal: Positive for falls and joint pain. Negative for back pain.  Skin: Negative for rash.  Neurological: Negative for dizziness, sensory change, focal weakness, seizures, loss of consciousness, weakness and headaches.  Endo/Heme/Allergies: Negative for polydipsia.  Psychiatric/Behavioral: Negative for depression. The patient is not nervous/anxious.     DRUG ALLERGIES:  No Known Allergies VITALS:  Blood pressure 113/72, pulse 91, temperature 98.4 F (36.9 C), temperature source Oral, resp. rate 18, height 5\' 9"  (1.753 m), weight 97.1 kg, SpO2 97 %. PHYSICAL EXAMINATION:  Physical Exam HENT:     Head: Normocephalic.  Eyes:     General: No scleral icterus.    Conjunctiva/sclera: Conjunctivae normal.     Pupils: Pupils are equal, round, and reactive to light.  Neck:     Musculoskeletal: Normal range of motion and neck supple.     Vascular: No JVD.     Trachea: No tracheal deviation.  Cardiovascular:     Rate and Rhythm: Normal rate and regular rhythm.     Heart sounds: Normal heart sounds. No murmur. No gallop.   Pulmonary:     Effort: Pulmonary effort is normal.  No respiratory distress.     Breath sounds: Normal breath sounds. No wheezing or rales.  Abdominal:     General: Bowel sounds are normal. There is no distension.     Palpations: Abdomen is soft.     Tenderness: There is no abdominal tenderness. There is no rebound.  Musculoskeletal:        General: No tenderness.     Right lower leg: No edema.     Left lower leg: No edema.     Comments: Bilateral knee swelling and tenderness.  No erythema.  Limited ROM.  Skin:    Findings: No erythema or rash.  Neurological:     General: No focal deficit present.     Mental Status: He is alert and oriented to person, place, and time.     Cranial Nerves: No cranial nerve deficit.  Psychiatric:        Mood and Affect: Mood normal.    LABORATORY PANEL:  Male CBC Recent Labs  Lab 12/22/18 0446  WBC 12.1*  HGB 11.6*  HCT 35.1*  PLT 288   ------------------------------------------------------------------------------------------------------------------ Chemistries  Recent Labs  Lab 12/21/18 2030 12/22/18 0446  NA 134* 136  K 4.3 4.1  CL 98 101  CO2 24 25  GLUCOSE 164* 162*  BUN 24* 24*  CREATININE 1.22 1.16  CALCIUM 8.9 8.8*  AST 25  --   ALT 10  --   ALKPHOS 84  --   BILITOT 0.9  --    RADIOLOGY:  Ct Head Wo Contrast  Result Date: 12/21/2018 CLINICAL DATA:  Headache EXAM: CT HEAD WITHOUT CONTRAST TECHNIQUE: Contiguous axial images were obtained from the base of the skull through the vertex without intravenous contrast. COMPARISON:  11/29/2016 FINDINGS: Brain: No evidence of acute infarction, hemorrhage, hydrocephalus, extra-axial collection or mass lesion/mass effect. Periventricular white matter hypodensity. Vascular: No hyperdense vessel or unexpected calcification. Skull: Normal. Negative for fracture or focal lesion. Sinuses/Orbits: No acute finding. Other: None. IMPRESSION: No acute intracranial pathology. Small-vessel white matter disease in keeping with advanced patient age.  Electronically Signed   By: Lauralyn PrimesAlex  Bibbey M.D.   On: 12/21/2018 21:16   Ct Lumbar Spine Wo Contrast  Result Date: 12/21/2018 CLINICAL DATA:  85 year old male with low back pain. Unable to walk due to pain. EXAM: CT LUMBAR SPINE WITHOUT CONTRAST TECHNIQUE: Multidetector CT imaging of the lumbar spine was performed without intravenous contrast administration. Multiplanar CT image reconstructions were also generated. COMPARISON:  [facility] lumbar MRI 04/28/2009. FINDINGS: Segmentation: Normal. Alignment: Mildly increased reversal of lumbar lordosis at L2-L3 since the 2010 MRI. Subtle grade 1 anterolisthesis has developed at L4-L5. Stable lumbar lordosis elsewhere. Vertebrae: Osteopenia. Multilevel postoperative changes to the lumbar posterior elements are new since the 2010 MRI, detailed below. No superimposed No acute osseous abnormality identified. Paraspinal and other soft tissues: Calcified aortic atherosclerosis. Negative visible noncontrast abdominal viscera. Mild postoperative changes to the posterior paraspinal soft tissues. Disc levels: T11-T12: Possible interbody ankylosis due to anterior endplate osteophyte or syndesmophyte. T12-L1:  Mild disc bulge and anterior endplate spurring. L1-L2: Mild vacuum disc and far lateral disc bulge with endplate spurring. Mild posterior element hypertrophy. Mild spinal stenosis suspected and probably not significantly changed since 2010. Associated chronic left L1 foraminal stenosis. L2-L3: Interval posterior decompression and interbody ankylosis. The severe spinal stenosis at this level in 2010 appears resolved. No definite foraminal stenosis. L3-L4: Vacuum disc with circumferential disc bulge. Endplate spurring and posterior element hypertrophy. Mild to moderate spinal and bilateral foraminal stenosis suspected at this level and increased since 2010. L4-L5: Posterior decompression at this level since 2010 with resolved spinal stenosis since that  time. Circumferential disc bulge and residual facet hypertrophy. New mild anterolisthesis. Mild to moderate bilateral lateral recess and L4 foraminal stenosis persists. L5-S1: Previous posterior decompression is stable since 2010. Evidence of interbody ankylosis now. No definite stenosis. IMPRESSION: 1. Osteopenia with no acute osseous abnormality identified. Ankylosis of T11-T12, L2-L3, and L5-S1. 2. Resolved spinal stenosis at several levels following posterior decompression since the 2010 MRI. Increased degenerative changes and stenosis suspected at L3-L4. Stable stenosis suspected at L1-L2. 3.  Aortic Atherosclerosis (ICD10-I70.0). Electronically Signed   By: Odessa FlemingH  Hall M.D.   On: 12/21/2018 21:21   Ct Knee Right Wo Contrast  Result Date: 12/21/2018 CLINICAL DATA:  85 year old male with right knee swelling. EXAM: CT OF THE right KNEE WITHOUT CONTRAST TECHNIQUE: Multidetector CT imaging of the right knee was performed according to the standard protocol. Multiplanar CT image reconstructions were also generated. COMPARISON:  Right knee radiograph dated 12/21/2018 FINDINGS: Evaluation is limited due to advanced osteopenia. Bones/Joint/Cartilage No definite acute fracture identified. Evaluation however is very limited due to osteopenia. There is no dislocation. There is severe arthritic changes with tricompartmental narrowing and bone spurring. There is mid-distal chondrocalcinosis. There is a moderate suprapatellar effusion. There is thickening and heterogeneity of the tissues surrounding the anterior and posterior cruciate ligaments. Small adjacent bone fragments posterior to the knee may represent loose body within the synovium or within a small Baker's cyst. Ligaments Suboptimally assessed by CT. Muscles and  Tendons No acute finding. Moderate fatty atrophy. Soft tissues No acute findings. Vascular calcifications noted. IMPRESSION: 1. No definite acute fracture or dislocation. 2. Severe arthritic changes of the  knee with chondrocalcinosis and a moderate suprapatellar effusion. Electronically Signed   By: Elgie Collard M.D.   On: 12/21/2018 21:23   Dg Knee Complete 4 Views Left  Result Date: 12/21/2018 CLINICAL DATA:  Fall EXAM: LEFT KNEE - COMPLETE 4+ VIEW COMPARISON:  11/28/2017 FINDINGS: No fracture or malalignment. Moderate severe arthritis involving the mediolateral and patellofemoral compartments. Joint space calcifications. Trace knee effusion. Vascular calcifications and surgical clips IMPRESSION: 1. No acute osseous abnormality 2. Tricompartment arthritis with trace knee effusion 3. Chondrocalcinosis Electronically Signed   By: Jasmine Pang M.D.   On: 12/21/2018 19:57   Dg Knee Complete 4 Views Right  Result Date: 12/21/2018 CLINICAL DATA:  Fall with knee pain EXAM: RIGHT KNEE - COMPLETE 4+ VIEW COMPARISON:  None. FINDINGS: No acute displaced fracture or malalignment. Moderate severe tricompartment arthritis of the knee. Small knee effusion. Lateral joint space calcification. Vascular calcifications. IMPRESSION: 1. No acute osseous abnormality 2. Arthritis of the knee with small knee effusion 3. Chondrocalcinosis Electronically Signed   By: Jasmine Pang M.D.   On: 12/21/2018 20:00   ASSESSMENT AND PLAN:   Possible hematoma due to   bilateral knee injury secondary to fall.   No fractures on imaging.  He is on Eliquis due to A. fib/flutter, hold Coumadin, PRN analgesia.  Orthopedic surgery consult.  Possible drainage from knees.    Syncope -patient recounts an episode 3 days ago where he "blacked out".  He does have a history of arrhythmias.  He also has a new LAFB on his EKG.   Telemetry monitor and follow-up cardiology consult.  Elevated troponin.  Cardiology consult.    Diabetes mellitus type 2, controlled, with complications (HCC) -sliding scale insulin coverage   CAD (coronary artery disease) -continue home meds.   PAF (paroxysmal atrial fibrillation) (HCC) -continue home medications.   Hold Eliquis due to possible knee hematoma.  Restart Eliquis tomorrow morning.    Chronic systolic CHF (congestive heart failure) (HCC) -continue home meds   Hyperlipidemia -home dose antilipid  Recurrent fall and weakness.  PT evaluation. I called his spouse, nobody answered the phone. All the records are reviewed and case discussed with Care Management/Social Worker. Management plans discussed with the patient, family and they are in agreement.  CODE STATUS: Full Code  TOTAL TIME TAKING CARE OF THIS PATIENT: 33 minutes.   More than 50% of the time was spent in counseling/coordination of care: YES  POSSIBLE D/C IN 2 DAYS, DEPENDING ON CLINICAL CONDITION.   Shaune Pollack M.D on 12/22/2018 at 11:14 AM  Between 7am to 6pm - Pager - [phone]  After 6pm go to [email or web address] - Therapist, nutritional Hospitalists

## 2018-12-22 NOTE — Progress Notes (Signed)
PT Cancellation Note  Patient Details Name: Mark Dougherty MRN: [id number] DOB: 05/15/1926   Cancelled Treatment:    Reason Eval/Treat Not Completed: Patient not medically ready(Chart reviewed, RN consulted. Ortho note pending. RN reports pt's knee likely to be aspirated per Dr. Leim Fabry. Will hold PT evauation until later date/time.)  10:08 AM, 12/22/18 Etta Grandchild, PT, DPT Physical Therapist - [facility]  [phone] ([place])    [place] [facility] 12/22/2018, 10:08 AM

## 2018-12-23 LAB — URINALYSIS, COMPLETE (UACMP) WITH MICROSCOPIC
Bacteria, UA: NONE SEEN
Bilirubin Urine: NEGATIVE
Glucose, UA: NEGATIVE mg/dL
Hgb urine dipstick: NEGATIVE
Ketones, ur: 5 mg/dL — AB
Leukocytes,Ua: NEGATIVE
Nitrite: NEGATIVE
Protein, ur: 30 mg/dL — AB
Specific Gravity, Urine: 1.019 (ref 1.005–1.030)
pH: 5 (ref 5.0–8.0)

## 2018-12-23 LAB — GLUCOSE, CAPILLARY
Glucose-Capillary: 121 mg/dL — ABNORMAL HIGH (ref 70–99)
Glucose-Capillary: 154 mg/dL — ABNORMAL HIGH (ref 70–99)

## 2018-12-23 MED ORDER — COLCHICINE 0.6 MG PO TABS: 0.6000 mg | ORAL_TABLET | Freq: Two times a day (BID) | ORAL | 0 refills | Status: DC

## 2018-12-23 NOTE — Discharge Summary (Signed)
[facility] [place] [facility]   PATIENT NAME: Mark Dougherty    MR#:  [id number]  DATE OF BIRTH:  12/16/1926  DATE OF ADMISSION:  12/21/2018   ADMITTING PHYSICIAN: Oralia Manisavid Willis, MD  DATE OF DISCHARGE: 12/23/2018 PRIMARY CARE PHYSICIAN: Jaclyn Shaggyate, Denny C, MD   ADMISSION DIAGNOSIS:  leg pain DISCHARGE DIAGNOSIS:  Principal Problem:   Knee injury Active Problems:   Diabetes mellitus type 2, controlled, with complications (HCC)   Hyperlipidemia   CAD (coronary artery disease)   PAF (paroxysmal atrial fibrillation) (HCC)   Chronic systolic CHF (congestive heart failure) (HCC)   Syncope  SECONDARY DIAGNOSIS:   Past Medical History:  Diagnosis Date  . A-fib (HCC)    a. in perioperative state-->converted to NSR on amiodarone  . Atrial flutter (HCC)    a. s/p DCCV 06/29/2015  . BPH (benign prostatic hyperplasia)   . CAD (coronary artery disease)    a. s/p CABG in 1996 in [place]  . Carotid artery stenosis    a. s/p left-sided CEA 2000; b. 02/2016 Carotid U/S: <39% bilat dzs.  . Chronic systolic CHF (congestive heart failure) (HCC)    a. echo 2011 with EF 40%  . Diabetes mellitus   . Hyperlipidemia   . Hypotension    mild  . Ischemic cardiomyopathy    a. 2011 Echo: Ef 40%.  . Left wrist fracture   . Peripheral neuropathy   . Spinal stenosis   . Thyroid disease    a. low TSH and elevated free T4 in June 2015, amiodarone was stopped at that time   HOSPITAL COURSE:   Pseudogout and frequent fall. The patient got right knee aspiration with 15 cc bloody fluid out.  Pseudogout per fluid test. Started colchicine per Dr. Signa KellSunny Patel.  The patient has symptoms is much better.  He has no complaints of knee pain.  Syncope, possible due to vasovagal and no further cardiac work-up per cardiology consult.  Elevated troponin.    Continue Eliquis.  Follow-up with cardiology as outpatient.  Diabetes mellitus type 2, controlled, with complications (HCC) -sliding  scale insulin coverage, resume metformin after discharge. CAD (coronary artery disease) -continue home meds. PAF (paroxysmal atrial fibrillation) (HCC) -continue home medications. Resumed Eliquis.  Chronic systolic CHF (congestive heart failure) (HCC) -continue home meds Hyperlipidemia -home dose antilipid  Recurrent fall and weakness.  HHPT.  DISCHARGE CONDITIONS:  Stable, discharge to home with home health and PT today. CONSULTS OBTAINED:  Treatment Team:  Signa KellPatel, Sunny, MD DRUG ALLERGIES:  No Known Allergies DISCHARGE MEDICATIONS:   Allergies as of 12/23/2018   No Known Allergies     Medication List    STOP taking these medications   diclofenac sodium 1 % Gel Commonly known as: VOLTAREN   magnesium oxide 400 MG tablet Commonly known as: MAG-OX     TAKE these medications   apixaban 5 MG Tabs tablet Commonly known as: Eliquis Take 1 tablet (5 mg total) by mouth 2 (two) times daily.   CENTRUM PO Take 1 tablet by mouth daily.   colchicine 0.6 MG tablet Take 1 tablet (0.6 mg total) by mouth 2 (two) times daily for 10 days.   finasteride 5 MG tablet Commonly known as: PROSCAR Take 5 mg by mouth daily.   folic acid 1 MG tablet Commonly known as: FOLVITE Take 1 mg by mouth daily.   HYDROcodone-acetaminophen 5-325 MG tablet Commonly known as: NORCO/VICODIN Take 1 tablet by mouth every 6 (six) hours as needed  for moderate pain.   metFORMIN 500 MG tablet Commonly known as: GLUCOPHAGE Take 500 mg by mouth 2 (two) times daily with a meal.   nystatin powder Commonly known as: MYCOSTATIN/NYSTOP Apply topically 4 (four) times daily. What changed:   how much to take  when to take this  reasons to take this   simvastatin 20 MG tablet Commonly known as: ZOCOR TAKE ONE TABLET BY MOUTH EVERY NIGHT AT BEDTIME What changed:   how much to take  how to take this  when to take this   terazosin 1 MG capsule Commonly known as: HYTRIN Take 1 mg by  mouth at bedtime.   VISINE TOTALITY MULTI-SYMPTOM OP Place 2 drops into both eyes daily as needed (for dry eyes).   VITAMIN B-12 PO Take 1 tablet by mouth daily.        DISCHARGE INSTRUCTIONS:  See AVS.  If you experience worsening of your admission symptoms, develop shortness of breath, life threatening emergency, suicidal or homicidal thoughts you must seek medical attention immediately by calling 911 or calling your MD immediately  if symptoms less severe.  You Must read complete instructions/literature along with all the possible adverse reactions/side effects for all the Medicines you take and that have been prescribed to you. Take any new Medicines after you have completely understood and accpet all the possible adverse reactions/side effects.   Please note  You were cared for by a hospitalist during your hospital stay. If you have any questions about your discharge medications or the care you received while you were in the hospital after you are discharged, you can call the unit and asked to speak with the hospitalist on call if the hospitalist that took care of you is not available. Once you are discharged, your primary care physician will handle any further medical issues. Please note that NO REFILLS for any discharge medications will be authorized once you are discharged, as it is imperative that you return to your primary care physician (or establish a relationship with a primary care physician if you do not have one) for your aftercare needs so that they can reassess your need for medications and monitor your lab values.    On the day of Discharge:  VITAL SIGNS:  Blood pressure 136/62, pulse 85, temperature 97.9 F (36.6 C), temperature source Oral, resp. rate 18, height 5\' 9"  (1.753 m), weight 97.1 kg, SpO2 100 %. PHYSICAL EXAMINATION:  GENERAL:  85 y.o.-year-old patient lying in the bed with no acute distress.  EYES: Pupils equal, round, reactive to light and  accommodation. No scleral icterus. Extraocular muscles intact.  HEENT: Head atraumatic, normocephalic. Oropharynx and nasopharynx clear.  NECK:  Supple, no jugular venous distention. No thyroid enlargement, no tenderness.  LUNGS: Normal breath sounds bilaterally, no wheezing, rales,rhonchi or crepitation. No use of accessory muscles of respiration.  CARDIOVASCULAR: S1, S2 normal. No murmurs, rubs, or gallops.  ABDOMEN: Soft, non-tender, non-distended. Bowel sounds present. No organomegaly or mass.  EXTREMITIES: No pedal edema, cyanosis, or clubbing.  Better right knee swelling. NEUROLOGIC: Cranial nerves II through XII are intact. Muscle strength 4/5 in all extremities. Sensation intact. Gait not checked.  PSYCHIATRIC: The patient is alert and oriented x 3.  SKIN: No obvious rash, lesion, or ulcer.  DATA REVIEW:   CBC Recent Labs  Lab 12/22/18 0446  WBC 12.1*  HGB 11.6*  HCT 35.1*  PLT 288    Chemistries  Recent Labs  Lab 12/21/18 2030 12/22/18 0446  NA 134*  136  K 4.3 4.1  CL 98 101  CO2 24 25  GLUCOSE 164* 162*  BUN 24* 24*  CREATININE 1.22 1.16  CALCIUM 8.9 8.8*  AST 25  --   ALT 10  --   ALKPHOS 84  --   BILITOT 0.9  --      Microbiology Results  Results for orders placed or performed during the hospital encounter of 12/21/18  SARS CORONAVIRUS 2 Nasal Swab Aptima Multi Swab     Status: None   Collection Time: 12/21/18 10:37 PM   Specimen: Aptima Multi Swab; Nasal Swab  Result Value Ref Range Status   SARS Coronavirus 2 NEGATIVE NEGATIVE Final    Comment: (NOTE) SARS-CoV-2 target nucleic acids are NOT DETECTED. The SARS-CoV-2 RNA is generally detectable in upper and lower respiratory specimens during the acute phase of infection. Negative results do not preclude SARS-CoV-2 infection, do not rule out co-infections with other pathogens, and should not be used as the sole basis for treatment or other patient management decisions. Negative results must be  combined with clinical observations, patient history, and epidemiological information. The expected result is Negative. Fact Sheet for Patients: SugarRoll.be Fact Sheet for Healthcare Providers: https://www.woods-mathews.com/ This test is not yet approved or cleared by the [place] FDA and  has been authorized for detection and/or diagnosis of SARS-CoV-2 by FDA under an Emergency Use Authorization (EUA). This EUA will remain  in effect (meaning this test can be used) for the duration of the COVID-19 declaration under Section 56 4(b)(1) of the Act, 21 U.S.C. section 360bbb-3(b)(1), unless the authorization is terminated or revoked sooner. Performed at [facility], [place] [address], [place], [place] [address]   Body fluid culture     Status: None (Preliminary result)   Collection Time: 12/22/18  3:00 PM   Specimen: KNEE; Body Fluid  Result Value Ref Range Status   Specimen Description   Final    KNEE RIGHT Performed at [facility], [address], [place], [place] [address]    Special Requests   Final    NONE Performed at [facility], [address] [place], [place] [address]    Gram Stain   Final    ABUNDANT WBC PRESENT, PREDOMINANTLY PMN NO ORGANISMS SEEN Performed at [facility], [place] [address], [place], [place] [address]    Culture PENDING  Incomplete   Report Status PENDING  Incomplete    RADIOLOGY:  No results found.   Management plans discussed with the patient, family and they are in agreement.  CODE STATUS: Full Code   TOTAL TIME TAKING CARE OF THIS PATIENT: 33 minutes.    Demetrios Loll M.D on 12/23/2018 at 10:33 AM  Between 7am to 6pm - Pager - [phone]  After 6pm go to [email or web address] - Proofreader  [facility] [place] [facility]  Office  [phone]  CC: Primary care physician; Albina Billet, MD   Note: This dictation was prepared with Dragon dictation  along with smaller phrase technology. Any transcriptional errors that result from this process are unintentional.

## 2018-12-23 NOTE — Discharge Instructions (Signed)
HHPT °

## 2018-12-23 NOTE — Progress Notes (Addendum)
Subjective:    Patient reports pain as mild.   Patient seen in rounds with Dr. Posey Pronto. Patient is well, and has had no acute complaints or problems Plan is to go rehab versus Skilled nursing facility after hospital stay. Negative for chest pain and shortness of breath Fever: no Gastrointestinal: Negative for nausea and vomiting  Objective: Vital signs in last 24 hours: Temp:  [97.8 F (36.6 C)-98.7 F (37.1 C)] 97.8 F (36.6 C) (08/24 2331) Pulse Rate:  [86-91] 86 (08/24 2331) Resp:  [16-18] 16 (08/24 2331) BP: (91-126)/(72-83) 126/83 (08/24 2331) SpO2:  [97 %] 97 % (08/24 2331)  Intake/Output from previous day:  Intake/Output Summary (Last 24 hours) at 12/23/2018 0722 Last data filed at 12/22/2018 1900 Gross per 24 hour  Intake 440 ml  Output 250 ml  Net 190 ml    Intake/Output this shift: No intake/output data recorded.  Labs: Recent Labs    12/21/18 2030 12/22/18 0446  HGB 12.3* 11.6*   Recent Labs    12/21/18 2030 12/22/18 0446  WBC 10.7* 12.1*  RBC 4.01* 3.76*  HCT 37.9* 35.1*  PLT 293 288   Recent Labs    12/21/18 2030 12/22/18 0446  NA 134* 136  K 4.3 4.1  CL 98 101  CO2 24 25  BUN 24* 24*  CREATININE 1.22 1.16  GLUCOSE 164* 162*  CALCIUM 8.9 8.8*   No results for input(s): LABPT, INR in the last 72 hours.   EXAM General - Patient is Alert and Oriented Extremity - Neurovascular intact Sensation intact distally  The right knee has mild effusion. He has better range of motion in bed.  No discomfort with range of motion testing. Motor Function - intact, moving foot and toes well on exam.   Past Medical History:  Diagnosis Date  . A-fib ([place])    a. in perioperative state-->converted to NSR on amiodarone  . Atrial flutter ([place])    a. s/p DCCV 06/29/2015  . BPH (benign prostatic hyperplasia)   . CAD (coronary artery disease)    a. s/p CABG in 1996 in [place]  . Carotid artery stenosis    a. s/p left-sided CEA 2000; b. 02/2016 Carotid U/S:  <39% bilat dzs.  . Chronic systolic CHF (congestive heart failure) ([place])    a. echo 2011 with EF 40%  . Diabetes mellitus   . Hyperlipidemia   . Hypotension    mild  . Ischemic cardiomyopathy    a. 2011 Echo: Ef 40%.  . Left wrist fracture   . Peripheral neuropathy   . Spinal stenosis   . Thyroid disease    a. low TSH and elevated free T4 in June 2015, amiodarone was stopped at that time    Assessment/Plan:    Principal Problem:   Knee injury Active Problems:   Diabetes mellitus type 2, controlled, with complications (HCC)   Hyperlipidemia   CAD (coronary artery disease)   PAF (paroxysmal atrial fibrillation) (HCC)   Chronic systolic CHF (congestive heart failure) (HCC)   Syncope  Estimated body mass index is 31.61 kg/m as calculated from the following:   Height as of this encounter: 5\' 9"  (1.753 m).   Weight as of this encounter: 97.1 kg. Up with therapy  DVT Prophylaxis - Eliquis Weight-Bearing as tolerated to right leg  Reche Dixon, PA-C Orthopaedic Surgery 12/23/2018, 7:22 AM    ADDENDUM: Agree with above note. Exam improved after aspiration and treatment initiation for pseudogout. Has mild pain but is at baseline. F/U with  me as needed as an outpatient. Recommend f/u with PCP for pseudogout.

## 2018-12-23 NOTE — Evaluation (Signed)
Occupational Therapy Evaluation Patient Details Name: Mark Dougherty MRN: [id number] DOB: 1926/11/08 Today's Date: 12/23/2018    History of Present Illness 85 y/o male here after multiple falls, injuring each knee.  He had fluid drained off R yesterday and states he is feeling much better, though both knees still having baseline pain.     Clinical Impression   Pt seen for OT evaluation this date. Prior to hospital admission, pt was generally independent at home with basic ADL and functional transfers in/out of scooter.  Pt lives with his spouse. Pt denies pain and is eager to return home to his wife. Currently pt requires CGA to PRN Min A for LB ADL and SBA for functional transfers. Appears near baseline. Pt would benefit from skilled OT to address noted impairments and functional limitations (see below for any additional details) while in the hospital in order to maximize safety and independence while minimizing falls risk and caregiver burden. Recommend a 3:1 for use over his standard commode at home to improve safety/indep with toilet transfers. Upon hospital discharge, recommend pt discharge home with spouse. Do not anticipate need for skilled OT after discharge.    Follow Up Recommendations  No OT follow up    Equipment Recommendations  3 in 1 bedside commode    Recommendations for Other Services       Precautions / Restrictions Precautions Precautions: Fall Restrictions Weight Bearing Restrictions: No      Mobility Bed Mobility Overal bed mobility: Needs Assistance Bed Mobility: Supine to Sit     Supine to sit: Min guard     General bed mobility comments: deferred, up in recliner  Transfers Overall transfer level: Modified independent Equipment used: Rolling walker (2 wheeled)             General transfer comment: cues for appropriate U&LE set up, slow with heavy use of UEs but able to rise w/o direct assist, pt did return to sitting quickly in somewhat  uncontrolled manner    Balance Overall balance assessment: Needs assistance   Sitting balance-Leahy Scale: Good       Standing balance-Leahy Scale: Fair Standing balance comment: Reliant on walker, able to maintain balance but some hesitancy                           ADL either performed or assessed with clinical judgement   ADL Overall ADL's : Needs assistance/impaired                                       General ADL Comments: CGA to PRN Min A for LB ADL and SBA for functional transfers     Vision Baseline Vision/History: Wears glasses Wears Glasses: At all times Patient Visual Report: No change from baseline Vision Assessment?: No apparent visual deficits     Perception     Praxis      Pertinent Vitals/Pain Pain Assessment: No/denies pain Pain Score: 4  Pain Location: R knee, though he states it is much better than yesterday and L knee has almost no pain     Hand Dominance     Extremity/Trunk Assessment Upper Extremity Assessment Upper Extremity Assessment: Generalized weakness;Overall [facility] for tasks assessed(age appropriate limitations, no elevation >80)   Lower Extremity Assessment Lower Extremity Assessment: Generalized weakness(unable to fully extend either knee, generally weak)       Communication  Communication Communication: No difficulties   Cognition Arousal/Alertness: Awake/alert Behavior During Therapy: WFL for tasks assessed/performed Overall Cognitive Status: No family/caregiver present to determine baseline cognitive functioning                                 General Comments: pleasant, mild confusion, able to participate appropriately   General Comments       Exercises Other Exercises Other Exercises: Pt educated in use of BSC over toilet to improve safety/indep with toilet transfers   Shoulder Instructions      Home Living Family/patient expects to be discharged to:: Private  residence Living Arrangements: Spouse/significant other     Home Access: Stairs to enter Technical brewer of Steps: 3 Entrance Stairs-Rails: Left Home Layout: One [place]: [place] - 2 wheels;Electric scooter   Additional Comments: Pt with some mild confusion       Prior Functioning/Environment Level of Independence: Needs assistance  Gait / Transfers Assistance Needed: Pt reports that he typically just transfers to the scooter, leans on counter top to get in/out of the bathroom ADL's / Homemaking Assistance Needed: pt reports indep with seated shower, dressing, and toileting at home            OT Problem List: Decreased strength;Decreased activity tolerance;Decreased knowledge of use of DME or AE      OT Treatment/Interventions: Self-care/ADL training;Therapeutic exercise;Therapeutic activities;DME and/or AE instruction;Patient/family education;Balance training    OT Goals(Current goals can be found in the care plan section) Acute Rehab OT Goals Patient Stated Goal: go home and try to get back to doing more walking OT Goal Formulation: With patient Time For Goal Achievement: 01/06/19 Potential to Achieve Goals: Good ADL Goals Pt Will Perform Lower Body Dressing: with caregiver independent in assisting;sit to/from stand Pt Will Transfer to Toilet: with supervision;ambulating(BSC over toilet, LRAD for amb) Additional ADL Goal #1: Pt will verbalize plan to implement at least 1 learned falls prevention strategy at home to minimize falls risk.  OT Frequency: Min 1X/week   Barriers to D/C:            Co-evaluation              AM-PAC OT "6 Clicks" Daily Activity     Outcome Measure Help from another person eating meals?: None Help from another person taking care of personal grooming?: None Help from another person toileting, which includes using toliet, bedpan, or urinal?: A Little Help from another person bathing (including  washing, rinsing, drying)?: A Little Help from another person to put on and taking off regular upper body clothing?: None Help from another person to put on and taking off regular lower body clothing?: A Little 6 Click Score: 21   End of Session    Activity Tolerance: Patient tolerated treatment well Patient left: in chair;with call bell/phone within reach;with chair alarm set  OT Visit Diagnosis: Other abnormalities of gait and mobility (R26.89);Muscle weakness (generalized) (M62.81)                Time: 7564-3329 OT Time Calculation (min): 17 min Charges:  OT General Charges $OT Visit: 1 Visit OT Evaluation $OT Eval Low Complexity: 1 Low   Jeni Salles, MPH, MS, OTR/L ascom [phone] 12/23/18, 11:46 AM

## 2018-12-23 NOTE — TOC Transition Note (Addendum)
Transition of Care [facility]) - CM/SW Discharge Note   Patient Details  Name: Mark Dougherty MRN: [id number] Date of Birth: 07-26-1926  [facility] CM/SW Contact:  Su Hilt, RN Phone Number: 12/23/2018, 11:12 AM   Clinical Narrative:    Met with the patient to discuss Plan and needs He lives at home with his wife and family provides transportation He has DME at home including a RW, Raised toilet seat, scooter and lift chair where he spends a lot of his time, He has chosen [facility] to provide PT He would benefit from getting a 3 in 1, I notified Brad with [employer] and it will be brought to the room. I provided the patient with a resource list for private pay aides He will need EMS transport unable to get up the stairs leading into the home   Final next level of care: [place] Barriers to Discharge: Barriers Resolved   Patient Goals and CMS Choice Patient states their goals for this hospitalization and ongoing recovery are:: go [facility] with [facility] CMS Medicare.gov Compare Post Acute Care list provided to:: Patient Choice offered to / list presented to : Patient  Discharge Placement                       Discharge Plan and Services   Discharge Planning Services: CM Consult            DME Arranged: 3-N-1 DME Agency: [employer] Date DME Agency Contacted: 12/23/18 Time DME Agency Contacted: [phone] Representative spoke with at DME Agency: [place]: PT, Nurse's Aide [place] Agency: [place] ([place]) Date [place]: 12/23/18 Time [place]: 1111 Representative spoke with at [place]: [place] ([place]) Interventions     Readmission Risk Interventions No flowsheet data found.

## 2018-12-23 NOTE — Progress Notes (Signed)
Patient is being discharged from unit to home via EMS non emergent services. Wife to accompany him. All personal belongings with patient.  Discharge information reviewed with patient and wife. Patient demonstrates no distress at time of discharge.

## 2018-12-23 NOTE — Evaluation (Signed)
Physical Therapy Evaluation Patient Details Name: Mark Dougherty MRN: [id number] DOB: 18-Oct-1926 Today's Date: 12/23/2018   History of Present Illness  85 y/o male here after multiple falls, injuring each knee.  He had fluid drained off R yesterday and states he is feeling much better, though both knees still having baseline pain.    Clinical Impression  Pt pleasant t/o the PT exam and subsequent mobility activities.  He report pain in R knee is much better since having the fluid pulled off and though he is still not 100% he is eager to see what he can do with PT.  He did reasonably well with bed mobility and getting to standing though he needed cuing and repeated explanation for safety and mobility tasks.  He was able to tolerate only minimal time standing after taking a few small but reasonably safe steps.  He did not control descent well and further cuing was again needed.  Spoke with wife about home vs rehab as well as the need to be honest about how much help she is able to give now and moving forward.  He appears reasonably close to his baseline (which includes very little walking, in scooter most of the time).      Follow Up Recommendations Home health PT    Equipment Recommendations  3in1 (PT)    Recommendations for Other Services       Precautions / Restrictions Precautions Precautions: Fall Restrictions Weight Bearing Restrictions: No      Mobility  Bed Mobility Overal bed mobility: Needs Assistance Bed Mobility: Supine to Sit     Supine to sit: Min guard     General bed mobility comments: heavy use of rails, able to get LEs to EOB, struugled but able to attain sitting w/o A  Transfers Overall transfer level: Modified independent Equipment used: Rolling walker (2 wheeled)             General transfer comment: cues for appropriate U&LE set up, slow with heavy use of UEs but able to rise w/o direct assist, pt did return to sitting quickly in somewhat uncontrolled  manner  Ambulation/Gait Ambulation/Gait assistance: Min guard;Min assist Gait Distance (Feet): 4 Feet Assistive device: Rolling walker (2 wheeled)       General Gait Details: Pt was able to take a few small, guarded steps but quickly fatigued and requested to sit.  He  Stairs            Engineer, building services [place] (Stroke Patients Only)       Balance Overall balance assessment: Needs assistance   Sitting balance-Leahy Scale: Good       Standing balance-Leahy Scale: Fair Standing balance comment: Reliant on walker, able to maintain balance but some hesitancy                             Pertinent Vitals/Pain Pain Assessment: 0-10 Pain Score: 4  Pain Location: R knee, though he states it is much better than yesterday and L knee has almost no pain    Home Living Family/patient expects to be discharged to:: Private residence Living Arrangements: Spouse/significant other     Home Access: Stairs to enter Entrance Stairs-Rails: Left Entrance Stairs-Number of Steps: 3 Home Layout: One level Home Equipment: Walker - 2 wheels;Electric scooter Additional Comments: Pt with some mild confusion     Prior Function Level of Independence: Needs assistance   Gait / Transfers Assistance  Needed: Pt reports that he typically just transfers to the scooter, leans on counter top to get in/out of the bathroom           Hand Dominance        Extremity/Trunk Assessment   Upper Extremity Assessment Upper Extremity Assessment: Generalized weakness(age appropriate limitations, no elevation >80)    Lower Extremity Assessment Lower Extremity Assessment: (unable to fully extend either knee, generally weak)       Communication   Communication: No difficulties  Cognition Arousal/Alertness: Awake/alert Behavior During Therapy: WFL for tasks assessed/performed Overall Cognitive Status: (pleasant, mild confusion, able to participate appropriately)                                         General Comments      Exercises     Assessment/Plan    PT Assessment Patient needs continued PT services  PT Problem List Decreased strength;Decreased range of motion;Decreased activity tolerance;Decreased balance;Decreased mobility;Decreased coordination;Decreased safety awareness;Decreased knowledge of use of DME;Pain;Decreased cognition       PT Treatment Interventions DME instruction;Gait training;Stair training;Functional mobility training;Therapeutic activities;Therapeutic exercise;Balance training;Patient/family education;Cognitive remediation    PT Goals (Current goals can be found in the Care Plan section)  Acute Rehab PT Goals Patient Stated Goal: go home and try to get back to doing more walking PT Goal Formulation: With patient Time For Goal Achievement: 01/06/19 Potential to Achieve Goals: Good    Frequency Min 2X/week   Barriers to discharge        Co-evaluation               AM-PAC PT "6 Clicks" Mobility  Outcome Measure Help needed turning from your back to your side while in a flat bed without using bedrails?: None Help needed moving from lying on your back to sitting on the side of a flat bed without using bedrails?: None Help needed moving to and from a bed to a chair (including a wheelchair)?: A Little Help needed standing up from a chair using your arms (e.g., wheelchair or bedside chair)?: A Little Help needed to walk in hospital room?: A Little Help needed climbing 3-5 steps with a railing? : A Lot 6 Click Score: 19    End of Session Equipment Utilized During Treatment: Gait belt Activity Tolerance: Patient tolerated treatment well;Patient limited by fatigue Patient left: with chair alarm set;with call bell/phone within reach Nurse Communication: Mobility status PT Visit Diagnosis: Muscle weakness (generalized) (M62.81);Difficulty in walking, not elsewhere classified (R26.2);Pain Pain -  Right/Left: Right Pain - part of body: Knee    Time: 0837-0909 PT Time Calculation (min) (ACUTE ONLY): 32 min   Charges:   PT Evaluation $PT Eval Low Complexity: 1 Low PT Treatments $Therapeutic Activity: 8-22 mins        Malachi Pro, DPT 12/23/2018, 11:32 AM

## 2018-12-26 LAB — BODY FLUID CULTURE: Culture: NO GROWTH

## 2018-12-30 ENCOUNTER — Ambulatory Visit (INDEPENDENT_AMBULATORY_CARE_PROVIDER_SITE_OTHER): Payer: Medicare Other | Admitting: Orthopaedic Surgery

## 2018-12-30 ENCOUNTER — Encounter: Payer: Self-pay | Admitting: Orthopaedic Surgery

## 2018-12-30 DIAGNOSIS — M25561 Pain in right knee: Secondary | ICD-10-CM

## 2018-12-30 DIAGNOSIS — G8929 Other chronic pain: Secondary | ICD-10-CM | POA: Diagnosis not present

## 2018-12-30 DIAGNOSIS — I255 Ischemic cardiomyopathy: Secondary | ICD-10-CM | POA: Diagnosis not present

## 2018-12-30 MED ORDER — METHYLPREDNISOLONE ACETATE 40 MG/ML IJ SUSP
40.0000 mg | INTRAMUSCULAR | Status: AC | PRN
  Administered 2018-12-30: 40 mg via INTRA_ARTICULAR

## 2018-12-30 MED ORDER — LIDOCAINE HCL 1 % IJ SOLN
3.0000 mL | INTRAMUSCULAR | Status: AC | PRN
  Administered 2018-12-30: 11:00:00 3 mL

## 2018-12-30 MED ORDER — HYDROCODONE-ACETAMINOPHEN 5-325 MG PO TABS: 1.0000 | ORAL_TABLET | Freq: Two times a day (BID) | ORAL | 0 refills | Status: DC | PRN

## 2018-12-30 NOTE — Progress Notes (Signed)
Office Visit Note   Patient: Mark Dougherty           Date of Birth: [birth year]           MRN: [id number] Visit Date: 12/30/2018              Requested by: [name], MD [address]   [place],  [place] [address] PCP: [name], MD   Assessment & Plan: Visit Diagnoses:  1. Chronic pain of right knee     Plan: Once again we did review the labs that were drawn from his knee and it did show extracellular crystals consistent with gout.  I will have him check with his primary care physician about medication alternatives for treating gout since the colchicine is expensive for him.  I did place a steroid injection in his right knee because I do feel that this will help with his acute pain from gout.  I will send in some hydrocodone.  All question concerns were answered addressed.  I did write a note for his wife to get so she can understand what our treatment plan is.  Follow-up from my standpoint is as needed.  Follow-Up Instructions: Return if symptoms worsen or fail to improve.   Orders:  Orders Placed This Encounter  Procedures  . Large Joint Inj   Meds ordered this encounter  Medications  . HYDROcodone-acetaminophen (NORCO/VICODIN) 5-325 MG tablet    Sig: Take 1 tablet by mouth 2 (two) times daily as needed for moderate pain.    Dispense:  40 tablet    Refill:  0      Procedures: Large Joint Inj: R knee on 12/30/2018 11:13 AM Indications: diagnostic evaluation and pain Details: 22 G 1.5 in needle, superolateral approach  Arthrogram: No  Medications: 3 mL lidocaine 1 %; 40 mg methylPREDNISolone acetate 40 MG/ML Outcome: tolerated well, no immediate complications Procedure, treatment alternatives, risks and benefits explained, specific risks discussed. Consent was given by the patient. Immediately prior to procedure a time out was called to verify the correct patient, procedure, equipment, support staff and site/side marked as required. Patient was prepped and  draped in the usual sterile fashion.       Clinical Data: No additional findings.   Subjective: Chief Complaint  Patient presents with  . Right Knee - Pain  The patient comes in today as a follow-up from emergency room after he had a mechanical fall injuring his right knee.  He had a lot of x-rays and CT scans performed in the emergency room and was eventually told that he had gout.  I see that they did perform an aspiration of his knee that did show crystals in his knee consistent with gout.  They gave him a prescription for colchicine.  However that is very expensive medication according to he and his wife and they are unsure whether they should have it filled or whether he truly had gout.  At 85 years old he has been a significant fall risk and this was a fall that he had again.  He is on the blood thinner Eliquis.  He cannot take anti-inflammatories.  He is requesting a refill of hydrocodone.  HPI  Review of Systems He currently denies any headache, chest pain, shortness of breath, fever, chills, nausea, vomiting.  Objective: Vital Signs: There were no vitals taken for this visit.  Physical Exam He is alert and follows commands. Ortho Exam Examination of his right knee does show  a mild effusion.  He has painful range of motion of his knee and known severe osteoarthritis. Specialty Comments:  No specialty comments available.  Imaging: No results found. Independent review of the CT scan of his right knee did not show any fractures but did show significant arthritic changes.  PMFS History: Patient Active Problem List   Diagnosis Date Noted  . Knee injury 12/21/2018  . Unilateral primary osteoarthritis, left knee 11/28/2017  . Unilateral primary osteoarthritis, right knee 11/28/2017  . Syncope 11/29/2016  . Acute left-sided low back pain without sciatica 11/12/2016  . Chronic systolic CHF (congestive heart failure) ([place])   . Ischemic cardiomyopathy   . Thyroid disease    . Typical atrial flutter ([place])   . Atrial flutter ([place]) 05/31/2015  . Neuropathy 05/31/2015  . PAF (paroxysmal atrial fibrillation) ([place]) 01/04/2011  . Congestive dilated cardiomyopathy ([place]) 09/28/2010  . Dehydration 09/14/2010  . Diabetes mellitus type 2, controlled, with complications ([place]) 75/17/0017  . Hyperlipidemia 01/09/2010  . CAD (coronary artery disease) 01/09/2010  . Carotid stenosis 01/09/2010   Past Medical History:  Diagnosis Date  . A-fib ([place])    a. in perioperative state-->converted to NSR on amiodarone  . Atrial flutter ([place])    a. s/p DCCV 06/29/2015  . BPH (benign prostatic hyperplasia)   . CAD (coronary artery disease)    a. s/p CABG in 1996 in [place]  . Carotid artery stenosis    a. s/p left-sided CEA 2000; b. 02/2016 Carotid U/S: <39% bilat dzs.  . Chronic systolic CHF (congestive heart failure) ([place])    a. echo 2011 with EF 40%  . Diabetes mellitus   . Hyperlipidemia   . Hypotension    mild  . Ischemic cardiomyopathy    a. 2011 Echo: Ef 40%.  . Left wrist fracture   . Peripheral neuropathy   . Spinal stenosis   . Thyroid disease    a. low TSH and elevated free T4 in June 2015, amiodarone was stopped at that time    Family History  Family history unknown: Yes    Past Surgical History:  Procedure Laterality Date  . BACK SURGERY  1975  . CARDIOVERSION N/A 08/22/2017   Procedure: CARDIOVERSION;  Surgeon: Minna Merritts, MD;  Location: [facility] ORS;  Service: Cardiovascular;  Laterality: N/A;  . CAROTID ENDARTERECTOMY     2010  . CORONARY ARTERY BYPASS GRAFT  1996   x3  . ELECTROPHYSIOLOGIC STUDY N/A 06/29/2015   Procedure: Cardioversion;  Surgeon: Minna Merritts, MD;  Location: [facility] ORS;  Service: Cardiovascular;  Laterality: N/A;  . EYE SURGERY  2013  . REPLACEMENT TOTAL KNEE     left  . TOOTH EXTRACTION     Social History   Occupational History  . Not on file  Tobacco Use  . Smoking status: Never Smoker  . Smokeless tobacco: Former Chief Strategy Officer and Sexual Activity  . Alcohol use: No  . Drug use: No  . Sexual activity: Not Currently

## 2019-01-06 ENCOUNTER — Ambulatory Visit (INDEPENDENT_AMBULATORY_CARE_PROVIDER_SITE_OTHER): Payer: Medicare Other | Admitting: General Practice

## 2019-01-06 ENCOUNTER — Other Ambulatory Visit: Payer: Self-pay

## 2019-01-06 ENCOUNTER — Encounter: Payer: Self-pay | Admitting: Nurse Practitioner

## 2019-01-06 VITALS — BP 128/60 | HR 84 | Ht 71.0 in | Wt 204.0 lb

## 2019-01-06 DIAGNOSIS — W19XXXD Unspecified fall, subsequent encounter: Secondary | ICD-10-CM | POA: Diagnosis not present

## 2019-01-06 DIAGNOSIS — I480 Paroxysmal atrial fibrillation: Secondary | ICD-10-CM

## 2019-01-06 DIAGNOSIS — I255 Ischemic cardiomyopathy: Secondary | ICD-10-CM

## 2019-01-06 DIAGNOSIS — I25118 Atherosclerotic heart disease of native coronary artery with other forms of angina pectoris: Secondary | ICD-10-CM | POA: Diagnosis not present

## 2019-01-06 DIAGNOSIS — I350 Nonrheumatic aortic (valve) stenosis: Secondary | ICD-10-CM | POA: Diagnosis not present

## 2019-01-06 NOTE — Progress Notes (Signed)
Cardiology Clinic Note   Patient Name: Mark Dougherty Date of Encounter: 01/06/2019  Primary Care Provider:  [name], MD Primary Cardiologist:  No primary care provider on file.  Patient Profile    Mark Dougherty 85 year old male presents today for follow-up of his coronary artery disease, PAF, and chronic systolic CHF.  Past Medical History    Past Medical History:  Diagnosis Date  . A-fib (HCC)    a. in perioperative state-->converted to NSR on amiodarone  . Atrial flutter (HCC)    a. s/p DCCV 06/29/2015  . BPH (benign prostatic hyperplasia)   . CAD (coronary artery disease)    a. s/p CABG in 1996 in [place]  . Carotid artery stenosis    a. s/p left-sided CEA 2000; b. 02/2016 Carotid U/S: <39% bilat dzs; c. 11/2017 Carotid U/S: 1-39% bilat ICA stenosis.  . Chronic combined systolic and diastolic CHF (congestive heart failure) (HCC)    a. echo 2011 with EF 40%; b. 11/2018 Echo: EF 55-60%. Mod conc LVH.  . Diabetes mellitus   . Hyperlipidemia   . Hypotension    mild  . Ischemic cardiomyopathy    a. 2011 Echo: EF 40%; b. 11/2018 Echo: EF 55-60%, mod conc LVH.  Marland Kitchen Left wrist fracture   . Peripheral neuropathy   . Spinal stenosis   . Thyroid disease    a. low TSH and elevated free T4 in June 2015, amiodarone was stopped at that time   Past Surgical History:  Procedure Laterality Date  . BACK SURGERY  1975  . CARDIOVERSION N/A 08/22/2017   Procedure: CARDIOVERSION;  Surgeon: Antonieta Iba, MD;  Location: [facility] ORS;  Service: Cardiovascular;  Laterality: N/A;  . CAROTID ENDARTERECTOMY     2010  . CORONARY ARTERY BYPASS GRAFT  1996   x3  . ELECTROPHYSIOLOGIC STUDY N/A 06/29/2015   Procedure: Cardioversion;  Surgeon: Antonieta Iba, MD;  Location: [facility] ORS;  Service: Cardiovascular;  Laterality: N/A;  . EYE SURGERY  2013  . REPLACEMENT TOTAL KNEE     left  . TOOTH EXTRACTION      Allergies  No Known Allergies  History of Present Illness    Mark Dougherty was last  seen by Marisue Ivan on 12/22/2018.  He had recently had a fall and was being evaluated for a syncopal event.  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  He has a previous history of atrial flutter/fibrillation status post cardioversion in 2017 and is taking Eliquis.  Coronary artery disease status post CABG in 1996, mild to moderate aortic stenosis, and ischemic cardiomyopathy, last echocardiogram showed LVEF of 55 to 60% moderate concentric left ventricular hypertrophy, moderate thickening of the aortic valve, and moderate calcification of aortic valve.   His PMH also includes carotid stenosis, congestive dilated cardiomyopathy, PAF, atrial flutter, chronic systolic CHF, ischemic cardiomyopathy, syncope, type 2 diabetes, thyroid disease, hyperlipidemia, dehydration, left-sided back pain without sciatica, and knee injury.  He presents the clinic today and states he is feeling better however he has a contusion on his left ankle.  He states he does not remember how the bruise happened.  He states he has increased his physical activity and he has been walking with his walker about 50 feet 2 times per day.  He states the rest of the time he uses motorized wheelchair to get around his house and property.  He has not had any other falls.  He denies chest pain, shortness of breath, lower extremity edema, fatigue, palpitations, melena, hematuria, hemoptysis, diaphoresis, weakness, presyncope, syncope, orthopnea, and PND.   Home Medications    Prior to Admission medications   Medication Sig Start Date End Date Taking? Authorizing Provider  apixaban (ELIQUIS) 5 MG TABS tablet Take 1 tablet (5 mg total) by mouth 2 (two) times daily. 04/04/17   Dunn, Raymon Muttonyan M, PA-C  colchicine 0.6 MG tablet Take 1 tablet (0.6 mg total) by mouth 2 (two) times daily for 10 days. 12/23/18 01/02/19  Shaune Pollackhen, Qing, MD  Cyanocobalamin  (VITAMIN B-12 PO) Take 1 tablet by mouth daily.     [provider]  finasteride (PROSCAR) 5 MG tablet Take 5 mg by mouth daily.    [provider]  folic acid (FOLVITE) 1 MG tablet Take 1 mg by mouth daily.      [provider]  HYDROcodone-acetaminophen (NORCO/VICODIN) 5-325 MG tablet Take 1 tablet by mouth 2 (two) times daily as needed for moderate pain. 12/30/18   Kathryne HitchBlackman, Christopher Y, MD  metFORMIN (GLUCOPHAGE) 500 MG tablet Take 500 mg by mouth 2 (two) times daily with a meal.      [provider]  Multiple Vitamins-Minerals (CENTRUM PO) Take 1 tablet by mouth daily.    [provider]  nystatin (MYCOSTATIN/NYSTOP) powder Apply topically 4 (four) times daily. Patient taking differently: Apply 1 g topically 4 (four) times daily as needed (for rash).  01/05/17   Governor RooksLord, Rebecca, MD  simvastatin (ZOCOR) 20 MG tablet TAKE ONE TABLET BY MOUTH EVERY NIGHT AT BEDTIME Patient taking differently: TAKE 20 MG BY MOUTH EVERY NIGHT AT BEDTIME 02/04/17   Antonieta IbaGollan, Timothy J, MD  terazosin (HYTRIN) 1 MG capsule Take 1 mg by mouth at bedtime.    [provider]  Tetrahyd-Glyc-Hypro-PEG-ZnSulf (VISINE TOTALITY MULTI-SYMPTOM OP) Place 2 drops into both eyes daily as needed (for dry eyes).    [provider]    Family History    Family History  Family history unknown: Yes   He indicated that his mother is deceased. He indicated that his father is deceased.  Social History    Social History   Socioeconomic History  . Marital status: Married    Spouse name: Not on file  . Number of children: Not on file  . Years of education: Not on file  . Highest education level: Not on file  Occupational History  . Not on file  Social Needs  . Financial resource strain: Not on file  . Food insecurity    Worry: Not on file    Inability: Not on file  . Transportation needs    Medical: Not on file    Non-medical: Not on file  Tobacco Use  . Smoking  status: Never Smoker  . Smokeless tobacco: Former Engineer, waterUser  Substance and Sexual Activity  . Alcohol use: No  . Drug use: No  . Sexual activity: Not Currently  Lifestyle  . Physical activity    Days per week: Not on file    Minutes per session: Not on file  . Stress: Not on file  Relationships  . Social Musicianconnections    Talks on phone: Not on file    Gets together: Not on file    Attends religious service: Not on file    Active member of club or organization: Not on file    Attends meetings of clubs or organizations: Not on file  Relationship status: Not on file  . Intimate partner violence    Fear of current or ex partner: Not on file    Emotionally abused: Not on file    Physically abused: Not on file    Forced sexual activity: Not on file  Other Topics Concern  . Not on file  Social History Narrative   Lives with wife in [place].  Does not routinely exercise.  More or less chair bound in setting of back and hip pain.     Review of Systems    General:  No chills, fever, night sweats or weight changes.  Cardiovascular:  No chest pain, dyspnea on exertion, edema, orthopnea, palpitations, paroxysmal nocturnal dyspnea. Dermatological: No rash, lesions/masses Respiratory: No cough, dyspnea Urologic: No hematuria, dysuria Abdominal:   No nausea, vomiting, diarrhea, bright red blood per rectum, melena, or hematemesis Neurologic:  No visual changes, wkns, changes in mental status. All other systems reviewed and are otherwise negative except as noted above.  Physical Exam    VS:  BP 128/60 (BP Location: Right Arm, Patient Position: Sitting, Cuff Size: Normal)   Pulse 84   Ht 5\' 11"  (1.803 m)   Wt 204 lb (92.5 kg)   BMI 28.45 kg/m  , BMI Body mass index is 28.45 kg/m. GEN: Well nourished, well developed, in no acute distress. HEENT: normal. Neck: Supple, no JVD, carotid bruits, or masses. Cardiac: RRR, 2/6 systolic murmur right sternal border, rubs, or gallops. No clubbing,  cyanosis, edema.  Radials/DP/PT 2+ and equal bilaterally.  Respiratory:  Respirations regular and unlabored, clear to auscultation bilaterally. GI: Soft, nontender, nondistended, BS + x 4. MS: no deformity or atrophy. Skin: warm and dry, no rash. Neuro:  Strength and sensation are intact. Psych: Normal affect.  Accessory Clinical Findings    ECG personally reviewed by me today-normal sinus rhythm left anterior fascicular block 84 bpm- No acute changes  EKG 12/24/2018 Normal sinus rhythm left anterior fascicular block 93 bpm  Echocardiogram 12/22/2018 1. The left ventricle has normal systolic function, with an ejection fraction of 55-60%. The cavity size was normal. There is moderate concentric left ventricular hypertrophy. Left ventricular diastolic function could not be evaluated.  2. The right ventricle has normal systolic function. The cavity was normal. There is no increase in right ventricular wall thickness. Right ventricular systolic pressure could not be assessed.  3. The mitral valve is grossly normal. No evidence of mitral valve stenosis.  4. The aortic valve is abnormal. Moderate thickening of the aortic valve. Moderate calcification of the aortic valve. Aortic valve regurgitation was not assessed by color flow Doppler.  5. The aorta is not well visualized unless otherwise noted.  6. very limited views as outlined above.   Assessment & Plan   1.  Mechanical fall- patient had 2 mechanical falls at his residence.  He has had no presyncope or syncope.  Both falls related to trips while he was exiting his motorized wheelchair.  He has now ambulating 2 times per day with his walker and has not had any more reported falls. Continue to ambulate with walker Increase physical activity as tolerated Remove trip hazards from living environment  2.  Coronary artery disease status [place] CABG 1996,[place]-no chest pain today.  EKG today shows normal sinus rhythm with old left anterior fascicular  block 84 bpm. Continue simvastatin 20 mg tablet daily Continue low-sodium heart healthy diet Increase physical activity as tolerated  3.  Atrial fibrillation/flutter- EKG today normal sinus rhythm with old  anterior fascicular block 84 bpm. Continue apixaban 5 mg tablet twice daily  4.  Aortic stenosis- repeat echocardiogram on 12/22/2018 showed an ejection fraction of 55-60%, moderate concentric left ventricular hypertrophy,  Abnormal aortic valve, moderate thickening of the aortic valve, moderate calcification of the aortic valve and aortic valve regurgitation was not assessed by color flow Doppler.  Disposition: Follow-up with Dr. Rockey Situ in 3 months.  Deberah Pelton, NP-C 01/06/2019, 3:01 PM

## 2019-01-06 NOTE — Patient Instructions (Signed)
Medication Instructions:  Your physician recommends that you continue on your current medications as directed. Please refer to the Current Medication list given to you today.  If you need a refill on your cardiac medications before your next appointment, please call your pharmacy.   Lab work: - None ordered.  If you have labs (blood work) drawn today and your tests are completely normal, you will receive your results only by: Marland Kitchen MyChart Message (if you have MyChart) OR . A paper copy in the mail If you have any lab test that is abnormal or we need to change your treatment, we will call you to review the results.  Testing/Procedures: - None ordered.   Follow-Up: At [facility], you and your health needs are our priority.  As part of our continuing mission to provide you with exceptional heart care, we have created designated Provider Care Teams.  These Care Teams include your primary Cardiologist (physician) and Advanced Practice Providers (APPs -  Physician Assistants and Nurse Practitioners) who all work together to provide you with the care you need, when you need it. You will need a follow up appointment in 3 months.  Please call our office 2 months in advance to schedule this appointment.  You may see Dr Ida Rogue or one of the following Advanced Practice Providers on your designated Care Team:   Murray Hodgkins, NP Christell Faith, PA-C . Marrianne Mood, PA-C  Any Other Special Instructions Will Be Listed Below (If Applicable).  Your provider recommends you be cautious when getting up to move and walk around. Take it slow so to prevent yourself from falling.  Your provider recommends you eat a low sodium diet and increase your physical activity as you can tolerate. Please see handouts.

## 2019-01-15 ENCOUNTER — Other Ambulatory Visit: Payer: Self-pay | Admitting: Cardiovascular Disease

## 2019-01-15 DIAGNOSIS — I483 Typical atrial flutter: Secondary | ICD-10-CM

## 2019-01-15 DIAGNOSIS — I480 Paroxysmal atrial fibrillation: Secondary | ICD-10-CM

## 2019-01-15 NOTE — Telephone Encounter (Signed)
Refill Request.  

## 2019-01-16 NOTE — Telephone Encounter (Signed)
Prescription refill request for Eliquis received.  Last office visit:  [place] (01-06-2019) Scr: 1.16 (12-22-2018) Age: 85 Weight: 92.5 kg   Prescription refill sent.

## 2019-01-20 ENCOUNTER — Ambulatory Visit: Payer: Medicare Other | Admitting: Orthopaedic Surgery

## 2019-02-08 ENCOUNTER — Emergency Department
Admission: EM | Admit: 2019-02-08 | Discharge: 2019-02-08 | Disposition: A | Discharge status: Home / Self Care | Payer: Medicare Other | Attending: Emergency Medicine | Admitting: Emergency Medicine

## 2019-02-08 ENCOUNTER — Other Ambulatory Visit: Payer: Self-pay

## 2019-02-08 ENCOUNTER — Emergency Department: Payer: Medicare Other

## 2019-02-08 DIAGNOSIS — I480 Paroxysmal atrial fibrillation: Secondary | ICD-10-CM | POA: Insufficient documentation

## 2019-02-08 DIAGNOSIS — I5042 Chronic combined systolic (congestive) and diastolic (congestive) heart failure: Secondary | ICD-10-CM | POA: Diagnosis not present

## 2019-02-08 DIAGNOSIS — I4892 Unspecified atrial flutter: Secondary | ICD-10-CM | POA: Insufficient documentation

## 2019-02-08 DIAGNOSIS — Z79899 Other long term (current) drug therapy: Secondary | ICD-10-CM | POA: Diagnosis not present

## 2019-02-08 DIAGNOSIS — E1142 Type 2 diabetes mellitus with diabetic polyneuropathy: Secondary | ICD-10-CM | POA: Insufficient documentation

## 2019-02-08 DIAGNOSIS — R531 Weakness: Secondary | ICD-10-CM | POA: Diagnosis not present

## 2019-02-08 DIAGNOSIS — I25810 Atherosclerosis of coronary artery bypass graft(s) without angina pectoris: Secondary | ICD-10-CM | POA: Diagnosis not present

## 2019-02-08 DIAGNOSIS — Z7984 Long term (current) use of oral hypoglycemic drugs: Secondary | ICD-10-CM | POA: Diagnosis not present

## 2019-02-08 LAB — URINALYSIS, COMPLETE (UACMP) WITH MICROSCOPIC
Bacteria, UA: NONE SEEN
Bilirubin Urine: NEGATIVE
Glucose, UA: NEGATIVE mg/dL
Hgb urine dipstick: NEGATIVE
Ketones, ur: NEGATIVE mg/dL
Leukocytes,Ua: NEGATIVE
Nitrite: NEGATIVE
Protein, ur: NEGATIVE mg/dL
Specific Gravity, Urine: 1.017 (ref 1.005–1.030)
WBC, UA: NONE SEEN WBC/hpf (ref 0–5)
pH: 6 (ref 5.0–8.0)

## 2019-02-08 LAB — TROPONIN I (HIGH SENSITIVITY): Troponin I (High Sensitivity): 11 ng/L (ref <18)

## 2019-02-08 LAB — BASIC METABOLIC PANEL
Anion gap: 7 (ref 5–15)
BUN: 23 mg/dL (ref 8–23)
CO2: 25 mmol/L (ref 22–32)
Calcium: 8.7 mg/dL — ABNORMAL LOW (ref 8.9–10.3)
Chloride: 104 mmol/L (ref 98–111)
Creatinine, Ser: 1.05 mg/dL (ref 0.61–1.24)
GFR calc Af Amer: >60 mL/min (ref >60)
GFR calc non Af Amer: >60 mL/min (ref >60)
Glucose, Bld: 151 mg/dL — ABNORMAL HIGH (ref 70–99)
Potassium: 3.8 mmol/L (ref 3.5–5.1)
Sodium: 136 mmol/L (ref 135–145)

## 2019-02-08 LAB — CBC WITH DIFFERENTIAL/PLATELET
Abs Immature Granulocytes: 0.02 10*3/uL (ref 0.00–0.07)
Basophils Absolute: 0 10*3/uL (ref 0.0–0.1)
Basophils Relative: 0 %
Eosinophils Absolute: 0.1 10*3/uL (ref 0.0–0.5)
Eosinophils Relative: 1 %
HCT: 33.5 % — ABNORMAL LOW (ref 39.0–52.0)
Hemoglobin: 10.8 g/dL — ABNORMAL LOW (ref 13.0–17.0)
Immature Granulocytes: 0 %
Lymphocytes Relative: 30 %
Lymphs Abs: 2.4 10*3/uL (ref 0.7–4.0)
MCH: 30.5 pg (ref 26.0–34.0)
MCHC: 32.2 g/dL (ref 30.0–36.0)
MCV: 94.6 fL (ref 80.0–100.0)
Monocytes Absolute: 1.1 10*3/uL — ABNORMAL HIGH (ref 0.1–1.0)
Monocytes Relative: 14 %
Neutro Abs: 4.2 10*3/uL (ref 1.7–7.7)
Neutrophils Relative %: 55 %
Platelets: 227 10*3/uL (ref 150–400)
RBC: 3.54 MIL/uL — ABNORMAL LOW (ref 4.22–5.81)
RDW: 12.3 % (ref 11.5–15.5)
WBC: 7.8 10*3/uL (ref 4.0–10.5)
nRBC: 0 % (ref 0.0–0.2)

## 2019-02-08 MED ORDER — SODIUM CHLORIDE 0.9 % IV BOLUS
500.0000 mL | Freq: Once | INTRAVENOUS | Status: AC
  Administered 2019-02-08: 15:00:00 500 mL via INTRAVENOUS

## 2019-02-08 NOTE — ED Provider Notes (Signed)
-----------------------------------------   3:08 PM on 02/08/2019 -----------------------------------------  Blood pressure (!) 145/58, pulse 84, temperature 98.9 F (37.2 C), temperature source Oral, resp. rate (!) 21, height 5\' 11"  (1.803 m), weight 90.7 kg, SpO2 96 %.  Assuming care from Dr. Archie Balboa.  In short, Mark Dougherty is a 85 y.o. male with a chief complaint of Weakness .  Refer to the original H&P for additional details.  The current plan of care is to follow-up CXR and reassess following IVF.  Chest x-ray negative for acute process and patient feeling much better following IV fluid bolus.  He is sitting comfortably in stretcher and eating a meal without difficulty.  He is appropriate for discharge home with PCP follow-up, counseled to return to the ED for new or worsening symptoms.  Patient agrees with plan.    Blake Divine, MD 02/08/19 [phone]

## 2019-02-08 NOTE — ED Notes (Signed)
MD notified of green top hemolyzed- MD states to not redraw it d/t pt being discharged

## 2019-02-08 NOTE — ED Notes (Signed)
Lab called and stated that green top was hemolyzed- will redraw

## 2019-02-08 NOTE — ED Notes (Signed)
Pt wife called and gave her an update

## 2019-02-08 NOTE — ED Notes (Signed)
Pt tearful in room- states he misses his son- states the last two days he "just lost it"

## 2019-02-08 NOTE — ED Notes (Signed)
Per Dr Charna Archer- pt given sandwich tray

## 2019-02-08 NOTE — Discharge Instructions (Signed)
Please seek medical attention for any high fevers, chest pain, shortness of breath, change in behavior, persistent vomiting, bloody stool or any other new or concerning symptoms.  

## 2019-02-08 NOTE — ED Provider Notes (Signed)
[facility] Emergency Department Provider Note   ____________________________________________   I have reviewed the triage vital signs and the nursing notes.   HISTORY  Chief Complaint Weakness   History limited by: Not Limited   HPI Mark Dougherty is a 85 y.o. male who presents to the emergency department today because of concerns for weakness.  The patient states that for the past 2 days he has felt generalized weakness.  He describes that it is harder for him to ambulate but then normal.  He denies any fevers.  Denies any chest pain or shortness of breath.  He has not noticed any bad odor or painful urination. The patient states that he has been eating a normal amount.    Records reviewed. Per medical record review patient has a history of afib, CAD, DM.   Past Medical History:  Diagnosis Date  . A-fib ([place])    a. in perioperative state-->converted to NSR on amiodarone  . Atrial flutter ([place])    a. s/p DCCV 06/29/2015  . BPH (benign prostatic hyperplasia)   . CAD (coronary artery disease)    a. s/p CABG in 1996 in [place]  . Carotid artery stenosis    a. s/p left-sided CEA 2000; b. 02/2016 Carotid U/S: <39% bilat dzs; c. 11/2017 Carotid U/S: 1-39% bilat ICA stenosis.  . Chronic combined systolic and diastolic CHF (congestive heart failure) ([place])    a. echo 2011 with EF 40%; b. 11/2018 Echo: EF 55-60%. Mod conc LVH.  . Diabetes mellitus   . Hyperlipidemia   . Hypotension    mild  . Ischemic cardiomyopathy    a. 2011 Echo: EF 40%; b. 11/2018 Echo: EF 55-60%, mod conc LVH.  Marland Kitchen Left wrist fracture   . Peripheral neuropathy   . Spinal stenosis   . Thyroid disease    a. low TSH and elevated free T4 in June 2015, amiodarone was stopped at that time    Patient Active Problem List   Diagnosis Date Noted  . Knee injury 12/21/2018  . Unilateral primary osteoarthritis, left knee 11/28/2017  . Unilateral primary osteoarthritis, right knee 11/28/2017  . Syncope  11/29/2016  . Acute left-sided low back pain without sciatica 11/12/2016  . Chronic systolic CHF (congestive heart failure) ([place])   . Ischemic cardiomyopathy   . Thyroid disease   . Typical atrial flutter ([place])   . Atrial flutter ([place]) 05/31/2015  . Neuropathy 05/31/2015  . PAF (paroxysmal atrial fibrillation) ([place]) 01/04/2011  . Congestive dilated cardiomyopathy ([place]) 09/28/2010  . Dehydration 09/14/2010  . Diabetes mellitus type 2, controlled, with complications ([place]) 71/09/2692  . Hyperlipidemia 01/09/2010  . CAD (coronary artery disease) 01/09/2010  . Carotid stenosis 01/09/2010    Past Surgical History:  Procedure Laterality Date  . BACK SURGERY  1975  . CARDIOVERSION N/A 08/22/2017   Procedure: CARDIOVERSION;  Surgeon: Minna Merritts, MD;  Location: [facility] ORS;  Service: Cardiovascular;  Laterality: N/A;  . CAROTID ENDARTERECTOMY     2010  . CORONARY ARTERY BYPASS GRAFT  1996   x3  . ELECTROPHYSIOLOGIC STUDY N/A 06/29/2015   Procedure: Cardioversion;  Surgeon: Minna Merritts, MD;  Location: [facility] ORS;  Service: Cardiovascular;  Laterality: N/A;  . EYE SURGERY  2013  . REPLACEMENT TOTAL KNEE     left  . TOOTH EXTRACTION      Prior to Admission medications   Medication Sig Start Date End Date Taking? Authorizing Provider  colchicine 0.6 MG tablet Take 1 tablet (0.6  mg total) by mouth 2 (two) times daily for 10 days. 12/23/18 01/06/19  Shaune Pollack, MD  Cyanocobalamin (VITAMIN B-12 PO) Take 1 tablet by mouth daily.     [provider]  ELIQUIS 5 MG TABS tablet TAKE 1 TABLET BY MOUTH TWICE DAILY 01/16/19   Sondra Barges, PA-C  finasteride (PROSCAR) 5 MG tablet Take 5 mg by mouth daily.    [provider]  folic acid (FOLVITE) 1 MG tablet Take 1 mg by mouth daily.      [provider]  HYDROcodone-acetaminophen (NORCO/VICODIN) 5-325 MG tablet Take 1 tablet by mouth 2 (two) times daily as needed for moderate pain. 12/30/18   Kathryne Hitch, MD   metFORMIN (GLUCOPHAGE) 500 MG tablet Take 500 mg by mouth 2 (two) times daily with a meal.      [provider]  Multiple Vitamins-Minerals (CENTRUM PO) Take 1 tablet by mouth daily.    [provider]  nystatin (MYCOSTATIN/NYSTOP) powder Apply topically 4 (four) times daily. Patient taking differently: Apply 1 g topically 4 (four) times daily as needed (for rash).  01/05/17   Governor Rooks, MD  simvastatin (ZOCOR) 20 MG tablet TAKE ONE TABLET BY MOUTH EVERY NIGHT AT BEDTIME Patient taking differently: TAKE 20 MG BY MOUTH EVERY NIGHT AT BEDTIME 02/04/17   Antonieta Iba, MD  terazosin (HYTRIN) 1 MG capsule Take 1 mg by mouth at bedtime.    [provider]  Tetrahyd-Glyc-Hypro-PEG-ZnSulf (VISINE TOTALITY MULTI-SYMPTOM OP) Place 2 drops into both eyes daily as needed (for dry eyes).    [provider]    Allergies Patient has no known allergies.  Family History  Family history unknown: Yes    Social History Social History   Tobacco Use  . Smoking status: Never Smoker  . Smokeless tobacco: Former Engineer, water Use Topics  . Alcohol use: No  . Drug use: No    Review of Systems Constitutional: No fever/chills Eyes: No visual changes. ENT: No sore throat. Cardiovascular: Denies chest pain. Respiratory: Denies shortness of breath. Gastrointestinal: No abdominal pain.  No nausea, no vomiting.  No diarrhea.   Genitourinary: Negative for dysuria. Musculoskeletal: Negative for back pain. Skin: Negative for rash. Neurological: Negative for headaches, focal weakness or numbness.  ____________________________________________   PHYSICAL EXAM:  VITAL SIGNS: ED Triage Vitals  Enc Vitals Group     BP --      Pulse Rate 02/08/19 1312 82     Resp 02/08/19 1312 16     Temp 02/08/19 1312 98.9 F (37.2 C)     Temp Source 02/08/19 1312 Oral     SpO2 02/08/19 1312 96 %     Weight 02/08/19 1316 200 lb (90.7 kg)     Height 02/08/19 1316 5\' 11"   (1.803 m)     Head Circumference --      Peak Flow --      Pain Score 02/08/19 1313 10   Constitutional: Alert and oriented.  Eyes: Conjunctivae are normal.  ENT      Head: Normocephalic and atraumatic.      Nose: No congestion/rhinnorhea.      Mouth/Throat: Mucous membranes are moist.      Neck: No stridor. Hematological/Lymphatic/Immunilogical: No cervical lymphadenopathy. Cardiovascular: Normal rate, regular rhythm.  No murmurs, rubs, or gallops.  Respiratory: Normal respiratory effort without tachypnea nor retractions. Breath sounds are clear and equal bilaterally. No wheezes/rales/rhonchi. Gastrointestinal: Soft and non tender. No rebound. No guarding.  Genitourinary: Deferred Musculoskeletal:  Normal range of motion in all extremities. No lower extremity edema. Neurologic:  Normal speech and language. No gross focal neurologic deficits are appreciated.  Skin:  Skin is warm, dry and intact. No rash noted. Psychiatric: Mood and affect are normal. Speech and behavior are normal. Patient exhibits appropriate insight and judgment.  ____________________________________________    LABS (pertinent positives/negatives)  Trop hs 11 UA clear, unremarkable BMP wnl except glu 151, ca 8.7  ____________________________________________   EKG  I, Phineas Semen, attending physician, personally viewed and interpreted this EKG  EKG Time: 1313 Rate: 81 Rhythm: sinus rhythm Axis: left axis deviation Intervals: qtc 475 QRS: LBBB ST changes: no st elevation Impression: abnormal ekg ____________________________________________    RADIOLOGY  CXR pending  ____________________________________________   PROCEDURES  Procedures  ____________________________________________   INITIAL IMPRESSION / ASSESSMENT AND PLAN / ED COURSE  Pertinent labs & imaging results that were available during my care of the patient were reviewed by me and considered in my medical decision making  (see chart for details).   Patient presented to the emergency department today because of concerns for 2 days of weakness.  Differential would be broad including infection, anemia, electrolyte abnormality.  No focal weakness to suggest intracranial process.  Will initiate broad work-up.  Will give patient IV fluids to see if dehydration is playing a role.   ____________________________________________   FINAL CLINICAL IMPRESSION(S) / ED DIAGNOSES  Weakness  Note: This dictation was prepared with Dragon dictation. Any transcriptional errors that result from this process are unintentional     Phineas Semen, MD 02/09/19 1555

## 2019-02-08 NOTE — ED Notes (Signed)
Pt discharged to lobbyOpal Sidles RN aware that pts wife would be back with car

## 2019-02-08 NOTE — ED Notes (Signed)
X-ray at bedside

## 2019-02-08 NOTE — ED Notes (Signed)
Dr. Goodman at bedside.  

## 2019-02-08 NOTE — ED Notes (Signed)
Asked Dr Charna Archer about getting pt peanut butter crackers- was told to wait

## 2019-02-08 NOTE — ED Triage Notes (Signed)
Pt arrives via EMS from home after feeling weak and malaise the last 2 days- poor appetite and poor PO intake as well- per EMS pt vitals as follows HR 100 97.8 oral temp 96% room air 215 cbg

## 2019-02-28 IMAGING — CR DG CHEST 2V
1 series · 2 of 2 positions shown · non-contrast
Comparison: Radiograph November 29, 2016.

CLINICAL DATA: Painful breathing.

EXAM:
CHEST  2 VIEW

[Series 1: dg chest 2 view · 0.14mm/px · 2 of 2 slices shown]
[im 1/2]
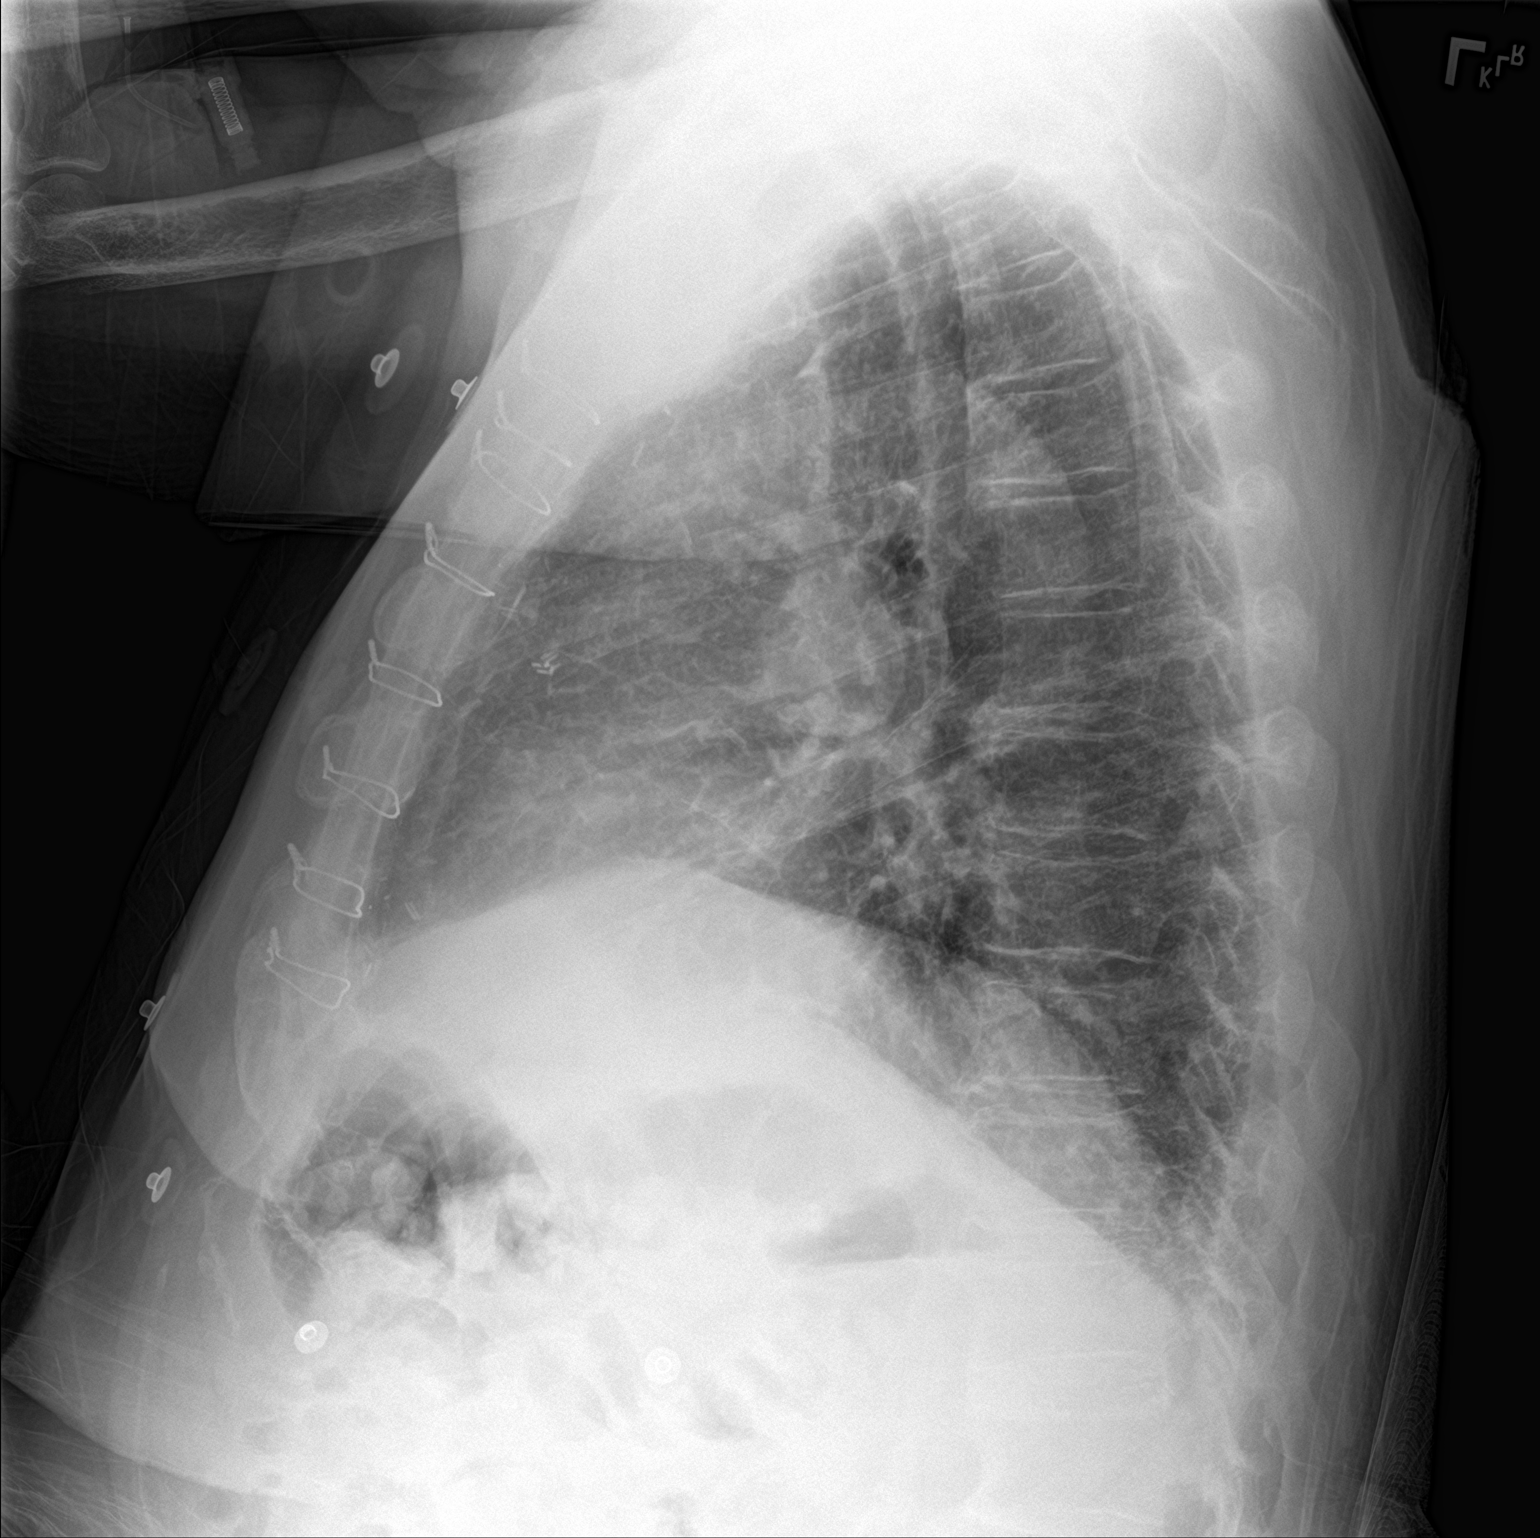
[im 2/2]
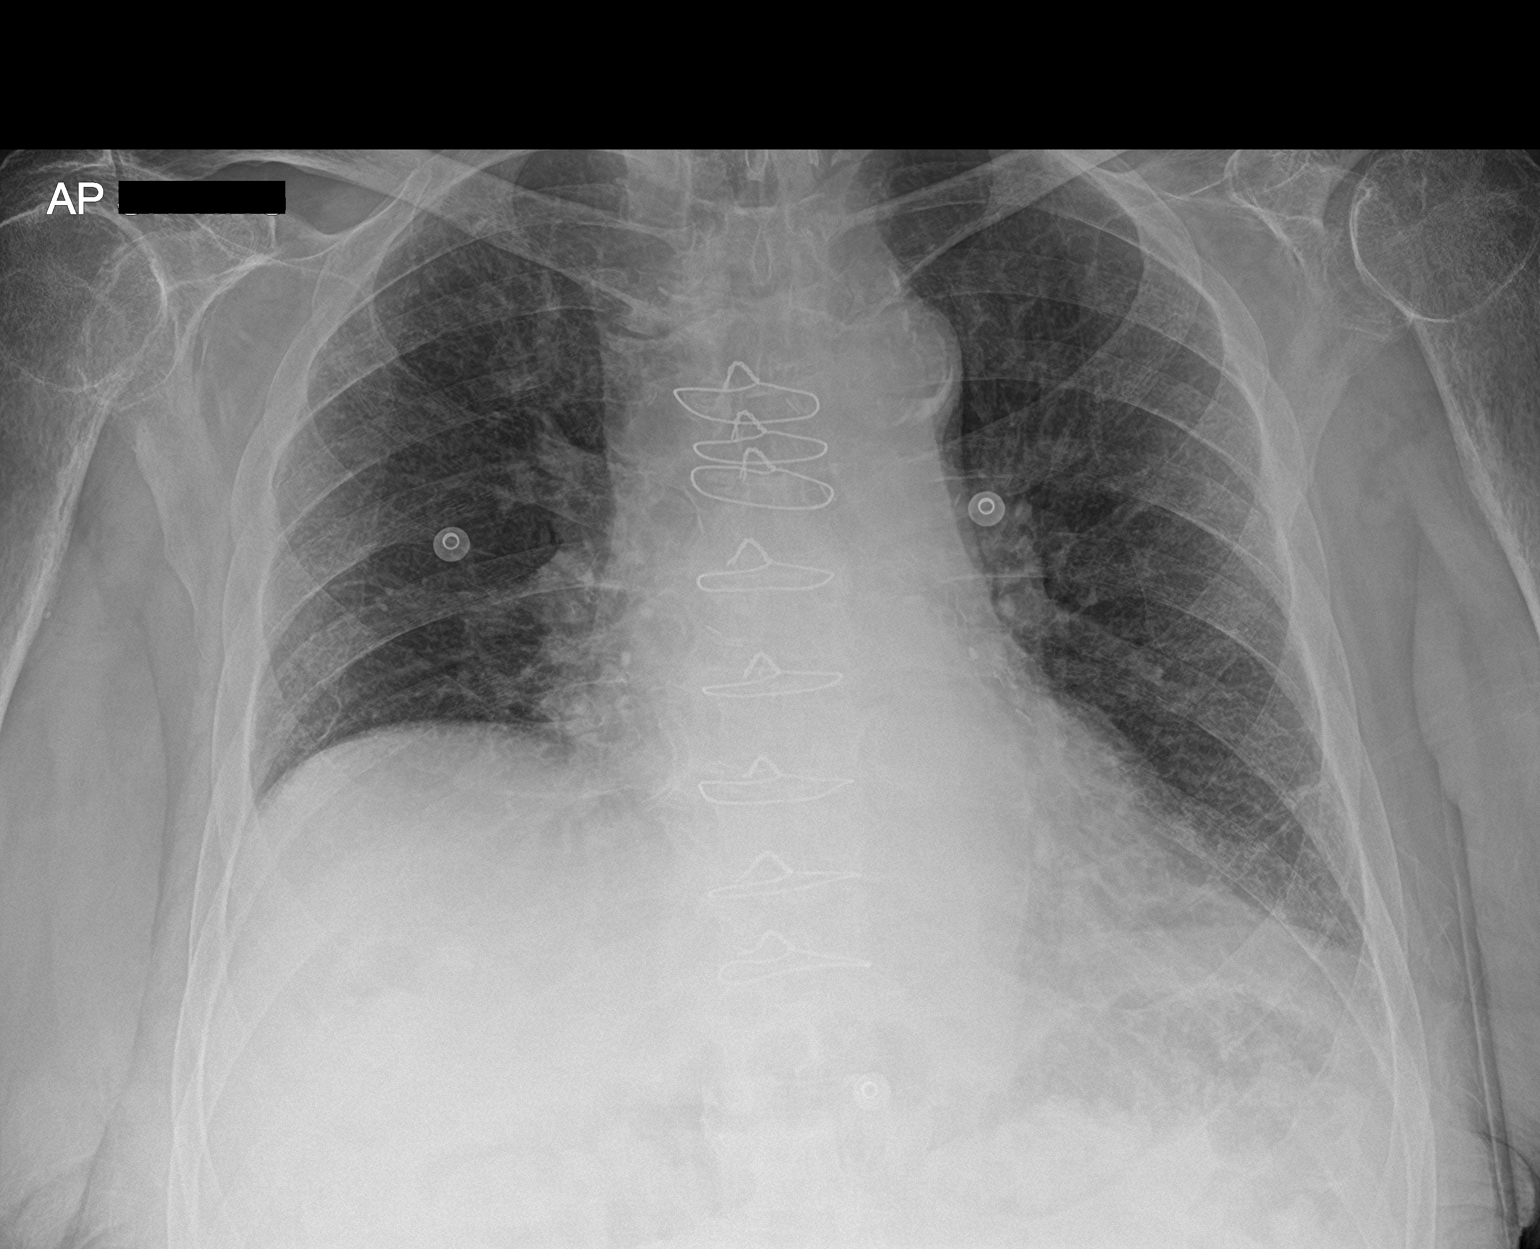

[2 of 2 positions shown; findings below may reference images not displayed]

FINDINGS: The heart size and mediastinal contours are within normal limits. No
pneumothorax or pleural effusion is noted. Atherosclerosis of
thoracic aorta is noted. Right lung is clear. Stable mild left
basilar scarring or subsegmental atelectasis is noted. The
visualized skeletal structures are unremarkable.
IMPRESSION: Aortic atherosclerosis. Stable mild left basilar scarring or
subsegmental atelectasis.

## 2019-04-01 ENCOUNTER — Telehealth: Payer: Self-pay | Admitting: Cardiovascular Disease

## 2019-04-01 NOTE — Telephone Encounter (Signed)
Made in error

## 2019-04-06 NOTE — Progress Notes (Signed)
Evaluation Performed:  Follow-up visit  Date:  04/07/2019   ID:  RHYTHM WIGFALL, DOB April 12, 1927, MRN [id number]  Patient Location:  [address] [place] [address]   Provider location:   [facility], [place] office  PCP:  [name], MD  Cardiologist:  [facility]bard Robinson [facility]   Chief Complaint  Patient presents with  . other    3 month follow up. Meds reviewed by the pt. verbally. Pt. c/o shortness of breath.     History of Present Illness:    CABOT CROMARTIE is a 85 y.o. male  past medical history of coronary artery disease,  bypass surgery in 1996 in [place],  PAD with carotid stenosis, CEA on the left in 2000 on the left,  diabetes,  severe chronic back pain with MRI showing spinal stenosis,  hyperlipidemia,  back surgery 4 to 5 years ago with complications including atrial fibrillation in the perioperative, postoperative period started on amiodarone with conversion to sinus rhythm, with a repeat admission to [facility] for dehydration, hypotension and malaise.  hospital admission February 2016 for failure to thrive, dehydration. Previous EKG showing atrial flutter, underwent cardioversion 3/2017which was successful in restoring normal sinus rhythm echocardiogram 2011 showing ejection fraction 40% EF 55 to 60% in 11/2018 Chronic shortness of breath, back pain, debility He presents for routine followup of his coronary artery disease and syncope  Seen in emergency room February 08, 2019 for weakness Records reviewed Was given IV fluids, and discharged home  In the hospital August 2020 with leg pain, Records reviewed Pseudogout, started on colchicine Had a fall, likely vasovagal following significant pain  Complains of SOB Can't do much around house Able to walk 100 feet 2 times with his walker then has to stop and rest Uses walker Chronic back pain  Lab work reviewed HCT 33 Creatinine 1.06, total cholesterol 109 LDL 46 A1c 6.8   EKG personally reviewed by myself on todays visit Normal sinus rhythm with rate 85 bpm R wave progression to the anterior precordial leads, left anterior fascicular block  No past medical history reviewed Cardioversion 07/2017,  Denies arrhythmia  Episode of syncope July  2018 UTI Carotid ultrasound less than 49% proximal right carotid disease, no significant stenosis on the left, Vertebral arteries patent Echocardiogram ejection fraction 55-60%, mild aortic valve stenosis Recent event monitor with one run of nonsustained VT 9 beats, no symptoms    Prior CV studies:   The following studies were reviewed today:  Echo 11/2016 - Left ventricle: The cavity size was normal. Systolic function was   normal. The estimated ejection fraction was in the range of 55%   to 60%. Wall motion was normal; there were no regional wall   motion abnormalities. Doppler parameters are consistent with   abnormal left ventricular relaxation (grade 1 diastolic   dysfunction). - Aortic valve: severely calcified leaflets. There was mild to   moderate stenosis by estimated AVA. Stenosis is borderline mild   by mean gradient and peak velocity. Valve area (VTI): 1.32 cm^2. - Left atrium: The atrium was mildly dilated. - Right ventricle: Systolic function was normal. - Pulmonary arteries: Systolic pressure was within the normal   range.  Past Medical History:  Diagnosis Date  . A-fib (HCC)    a. in perioperative state-->converted to NSR on amiodarone  . Atrial flutter (HCC)    a. s/p DCCV 06/29/2015  . BPH (benign prostatic hyperplasia)   .  CAD (coronary artery disease)    a. s/p CABG in 1996 in [place]  . Carotid artery stenosis    a. s/p left-sided CEA 2000; b. 02/2016 Carotid U/S: <39% bilat dzs; c. 11/2017 Carotid U/S: 1-39% bilat ICA stenosis.  . Chronic combined systolic and diastolic CHF (congestive heart failure) (HCC)    a. echo 2011 with EF 40%; b. 11/2018 Echo: EF 55-60%. Mod conc LVH.  . Diabetes  mellitus   . Hyperlipidemia   . Hypotension    mild  . Ischemic cardiomyopathy    a. 2011 Echo: EF 40%; b. 11/2018 Echo: EF 55-60%, mod conc LVH.  Marland Kitchen. Left wrist fracture   . Peripheral neuropathy   . Spinal stenosis   . Thyroid disease    a. low TSH and elevated free T4 in June 2015, amiodarone was stopped at that time   Past Surgical History:  Procedure Laterality Date  . BACK SURGERY  1975  . CARDIOVERSION N/A 08/22/2017   Procedure: CARDIOVERSION;  Surgeon: Antonieta IbaGollan, Kawika Bischoff J, MD;  Location: [facility] ORS;  Service: Cardiovascular;  Laterality: N/A;  . CAROTID ENDARTERECTOMY     2010  . CORONARY ARTERY BYPASS GRAFT  1996   x3  . ELECTROPHYSIOLOGIC STUDY N/A 06/29/2015   Procedure: Cardioversion;  Surgeon: Antonieta Ibaimothy J Anabell Swint, MD;  Location: [facility] ORS;  Service: Cardiovascular;  Laterality: N/A;  . EYE SURGERY  2013  . REPLACEMENT TOTAL KNEE     left  . TOOTH EXTRACTION       Current Meds  Medication Sig  . colchicine 0.6 MG tablet Take 1 tablet (0.6 mg total) by mouth 2 (two) times daily for 10 days.  . Cyanocobalamin (VITAMIN B-12 PO) Take 1 tablet by mouth daily.   Marland Kitchen. ELIQUIS 5 MG TABS tablet TAKE 1 TABLET BY MOUTH TWICE DAILY  . finasteride (PROSCAR) 5 MG tablet Take 5 mg by mouth daily.  . folic acid (FOLVITE) 1 MG tablet Take 1 mg by mouth daily.    Marland Kitchen. HYDROcodone-acetaminophen (NORCO/VICODIN) 5-325 MG tablet Take 1 tablet by mouth 2 (two) times daily as needed for moderate pain.  . metFORMIN (GLUCOPHAGE) 500 MG tablet Take 500 mg by mouth 2 (two) times daily with a meal.    . Multiple Vitamins-Minerals (CENTRUM PO) Take 1 tablet by mouth daily.  Marland Kitchen. nystatin (MYCOSTATIN/NYSTOP) powder Apply topically 4 (four) times daily. (Patient taking differently: Apply 1 g topically 4 (four) times daily as needed (for rash). )  . simvastatin (ZOCOR) 20 MG tablet TAKE ONE TABLET BY MOUTH EVERY NIGHT AT BEDTIME (Patient taking differently: TAKE 20 MG BY MOUTH EVERY NIGHT AT BEDTIME)  . terazosin  (HYTRIN) 1 MG capsule Take 1 mg by mouth at bedtime.  . Tetrahyd-Glyc-Hypro-PEG-ZnSulf (VISINE TOTALITY MULTI-SYMPTOM OP) Place 2 drops into both eyes daily as needed (for dry eyes).     Allergies:   Patient has no known allergies.   Social History   Tobacco Use  . Smoking status: Never Smoker  . Smokeless tobacco: Former Engineer, waterUser  Substance Use Topics  . Alcohol use: No  . Drug use: No     Current Outpatient Medications on File Prior to Visit  Medication Sig Dispense Refill  . colchicine 0.6 MG tablet Take 1 tablet (0.6 mg total) by mouth 2 (two) times daily for 10 days. 20 tablet 0  . Cyanocobalamin (VITAMIN B-12 PO) Take 1 tablet by mouth daily.     Marland Kitchen. ELIQUIS 5 MG TABS tablet TAKE 1 TABLET BY MOUTH TWICE DAILY  180 tablet 1  . finasteride (PROSCAR) 5 MG tablet Take 5 mg by mouth daily.    . folic acid (FOLVITE) 1 MG tablet Take 1 mg by mouth daily.      Marland Kitchen HYDROcodone-acetaminophen (NORCO/VICODIN) 5-325 MG tablet Take 1 tablet by mouth 2 (two) times daily as needed for moderate pain. 40 tablet 0  . metFORMIN (GLUCOPHAGE) 500 MG tablet Take 500 mg by mouth 2 (two) times daily with a meal.      . Multiple Vitamins-Minerals (CENTRUM PO) Take 1 tablet by mouth daily.    Marland Kitchen nystatin (MYCOSTATIN/NYSTOP) powder Apply topically 4 (four) times daily. (Patient taking differently: Apply 1 g topically 4 (four) times daily as needed (for rash). ) 15 g 0  . simvastatin (ZOCOR) 20 MG tablet TAKE ONE TABLET BY MOUTH EVERY NIGHT AT BEDTIME (Patient taking differently: TAKE 20 MG BY MOUTH EVERY NIGHT AT BEDTIME) 30 tablet 3  . terazosin (HYTRIN) 1 MG capsule Take 1 mg by mouth at bedtime.    . Tetrahyd-Glyc-Hypro-PEG-ZnSulf (VISINE TOTALITY MULTI-SYMPTOM OP) Place 2 drops into both eyes daily as needed (for dry eyes).     No current facility-administered medications on file prior to visit.      Family Hx: The patient's Family history is unknown by patient.  ROS:   Please see the history of present  illness.    Review of Systems  Constitutional: Negative.   HENT: Negative.   Respiratory: Positive for shortness of breath.   Cardiovascular: Negative.   Gastrointestinal: Negative.   Musculoskeletal: Positive for back pain.       Unsteady gait  Neurological: Negative.   Psychiatric/Behavioral: Negative.   All other systems reviewed and are negative.    Labs/Other Tests and Data Reviewed:    Recent Labs: 12/21/2018: ALT 10 02/08/2019: BUN 23; Creatinine, Ser 1.05; Hemoglobin 10.8; Platelets 227; Potassium 3.8; Sodium 136   Recent Lipid Panel No results found for: CHOL, TRIG, HDL, CHOLHDL, LDLCALC, LDLDIRECT  Wt Readings from Last 3 Encounters:  04/07/19 191 lb (86.6 kg)  02/08/19 200 lb (90.7 kg)  01/06/19 204 lb (92.5 kg)     Exam:    Vital Signs:  BP 100/60 (BP Location: Right Arm, Patient Position: Sitting, Cuff Size: Normal)   Pulse 85   Temp (!) 97 F (36.1 C)   Ht 5\' 11"  (1.803 m)   Wt 191 lb (86.6 kg)   BMI 26.64 kg/m   Constitutional:  oriented to person, place, and time. No distress.  HENT:  Head: Grossly normal Eyes:  no discharge. No scleral icterus.  Neck: No JVD, no carotid bruits  Cardiovascular: Regular rate and rhythm, no murmurs appreciated Pulmonary/Chest: Clear to auscultation bilaterally, no wheezes or rails Abdominal: Soft.  no distension.  no tenderness.  Musculoskeletal: Normal range of motion Neurological:  normal muscle tone. Coordination normal. No atrophy Skin: Skin warm and dry Psychiatric: normal affect, pleasant   ASSESSMENT & PLAN:    Coronary artery disease of native artery of native heart with stable angina pectoris (HCC) -  Currently with no symptoms of angina. No further workup at this time. Continue current medication regimen. Stable  Paroxysmal atrial fibrillation (HCC) - EKG maintaining normal sinus rhythm Continue anticoagulation  Ischemic cardiomyopathy - Ejection fraction normalized to 55% No significant lower  extremity edema No medication changes made  Chronic systolic CHF (congestive heart failure) (HCC) - euvolemic Will avoid overdiuresis, recently in the hospital for IV fluids Currently not on diuretics  Bilateral carotid artery  stenosis -  Prior ultrasound August 2019 Less than 39% bilaterally  Controlled type 2 diabetes mellitus with complication, without long-term current use of insulin (HCC) - Reasonable A1c in the 6 range Weight stable  Mixed hyperlipidemia - Cholesterol at goal, no medication changes made  Congestive dilated cardiomyopathy (HCC) -  Euvolemic, weight stable Normal ejection fraction  Typical atrial flutter (HCC) - Prior cardioversion x2 in 2017 and 2019 On eliquis Maintaining normal sinus rhythm    Total encounter time more than 25 minutes  Greater than 50% was spent in counseling and coordination of care with the patient    Disposition: Follow-up in 6 months   Signed, Julien Nordmann, MD  04/07/2019 2:15 PM    [facility] [address], [place], [place] [address]

## 2019-04-07 ENCOUNTER — Ambulatory Visit (INDEPENDENT_AMBULATORY_CARE_PROVIDER_SITE_OTHER): Payer: Medicare Other | Admitting: Cardiovascular Disease

## 2019-04-07 ENCOUNTER — Encounter: Payer: Self-pay | Admitting: Cardiovascular Disease

## 2019-04-07 ENCOUNTER — Other Ambulatory Visit: Payer: Self-pay

## 2019-04-07 VITALS — BP 100/60 | HR 85 | Temp 97.0°F | Ht 71.0 in | Wt 191.0 lb

## 2019-04-07 DIAGNOSIS — I350 Nonrheumatic aortic (valve) stenosis: Secondary | ICD-10-CM | POA: Diagnosis not present

## 2019-04-07 DIAGNOSIS — I25118 Atherosclerotic heart disease of native coronary artery with other forms of angina pectoris: Secondary | ICD-10-CM

## 2019-04-07 DIAGNOSIS — I255 Ischemic cardiomyopathy: Secondary | ICD-10-CM

## 2019-04-07 DIAGNOSIS — E118 Type 2 diabetes mellitus with unspecified complications: Secondary | ICD-10-CM

## 2019-04-07 DIAGNOSIS — I5022 Chronic systolic (congestive) heart failure: Secondary | ICD-10-CM

## 2019-04-07 DIAGNOSIS — I6523 Occlusion and stenosis of bilateral carotid arteries: Secondary | ICD-10-CM

## 2019-04-07 DIAGNOSIS — I480 Paroxysmal atrial fibrillation: Secondary | ICD-10-CM

## 2019-04-07 NOTE — Patient Instructions (Addendum)

## 2019-04-16 NOTE — Addendum Note (Signed)
Addended by: Anselm Pancoast on: 04/16/2019 11:48 AM   Modules accepted: Orders

## 2019-06-11 ENCOUNTER — Telehealth: Payer: Self-pay

## 2019-06-11 NOTE — Telephone Encounter (Signed)
Per fax received from [facility], patient has been approved to receive Eliquis free of charge from 06/10/2019 through 04/29/2020.

## 2019-07-27 ENCOUNTER — Ambulatory Visit (INDEPENDENT_AMBULATORY_CARE_PROVIDER_SITE_OTHER): Payer: Medicare Other | Admitting: Podiatry

## 2019-07-27 ENCOUNTER — Other Ambulatory Visit: Payer: Self-pay

## 2019-07-27 ENCOUNTER — Encounter: Payer: Self-pay | Admitting: Podiatry

## 2019-07-27 VITALS — Temp 97.4°F

## 2019-07-27 DIAGNOSIS — E118 Type 2 diabetes mellitus with unspecified complications: Secondary | ICD-10-CM

## 2019-07-27 DIAGNOSIS — B351 Tinea unguium: Secondary | ICD-10-CM

## 2019-07-27 DIAGNOSIS — D689 Coagulation defect, unspecified: Secondary | ICD-10-CM | POA: Diagnosis not present

## 2019-07-27 DIAGNOSIS — M79676 Pain in unspecified toe(s): Secondary | ICD-10-CM | POA: Diagnosis not present

## 2019-07-27 NOTE — Progress Notes (Signed)
This patient returns to my office for at risk foot care.  This patient requires this care by a professional since this patient will be at risk due to having  Diabetes with neuropathy and coagulation defect.  Patient is taking eliquiss.  This patient is unable to cut nails himself since the patient cannot reach his nails.These nails are painful walking and wearing shoes.  This patient presents for at risk foot care today.  Patient presents with his wife and in a wheelchair.  General Appearance  Alert, conversant and in no acute stress.  Vascular  Dorsalis pedis and posterior tibial  pulses are not  palpable  bilaterally.  Capillary return is within normal limits  bilaterally. Temperature is within normal limits  bilaterally.  Neurologic  Deferred due HOH.  Nails Thick disfigured discolored nails with subungual debris  from hallux to fifth toes bilaterally. No evidence of bacterial infection or drainage bilaterally.  Orthopedic  No limitations of motion  feet .  No crepitus or effusions noted.  No bony pathology or digital deformities noted.  Skin  normotropic skin with no porokeratosis noted bilaterally.  No signs of infections or ulcers noted.     Onychomycosis  Pain in right toes  Pain in left toes  Consent was obtained for treatment procedures.   Mechanical debridement of nails 1-5  bilaterally performed with a nail nipper.  Filed with dremel without incident.    Return office visit   prn                  Told patient to return for periodic foot care and evaluation due to potential at risk complications.   Helane Gunther DPM

## 2019-08-27 ENCOUNTER — Ambulatory Visit: Payer: Medicare Other | Admitting: Orthopaedic Surgery

## 2019-09-21 ENCOUNTER — Other Ambulatory Visit: Payer: Self-pay | Admitting: Internal Medicine

## 2019-09-21 DIAGNOSIS — R569 Unspecified convulsions: Secondary | ICD-10-CM

## 2019-09-29 ENCOUNTER — Ambulatory Visit: Admission: RE | Admit: 2019-09-29 | Payer: Medicare Other | Source: Ambulatory Visit

## 2019-10-06 ENCOUNTER — Other Ambulatory Visit: Payer: Self-pay

## 2019-10-06 ENCOUNTER — Ambulatory Visit
Admission: RE | Admit: 2019-10-06 | Discharge: 2019-10-06 | Disposition: A | Discharge status: Home / Self Care | Payer: Medicare Other | Source: Ambulatory Visit | Attending: Internal Medicine | Admitting: Internal Medicine

## 2019-10-06 ENCOUNTER — Ambulatory Visit: Payer: Medicare Other | Admitting: Cardiovascular Disease

## 2019-10-06 DIAGNOSIS — R569 Unspecified convulsions: Secondary | ICD-10-CM | POA: Diagnosis present

## 2019-10-14 ENCOUNTER — Ambulatory Visit: Payer: Medicare Other | Admitting: Family

## 2019-10-21 ENCOUNTER — Ambulatory Visit (INDEPENDENT_AMBULATORY_CARE_PROVIDER_SITE_OTHER): Payer: Medicare Other | Admitting: Family

## 2019-10-21 ENCOUNTER — Other Ambulatory Visit: Payer: Self-pay

## 2019-10-21 ENCOUNTER — Encounter: Payer: Self-pay | Admitting: Family

## 2019-10-21 VITALS — BP 134/80 | HR 78 | Ht 71.0 in | Wt 180.0 lb

## 2019-10-21 DIAGNOSIS — I350 Nonrheumatic aortic (valve) stenosis: Secondary | ICD-10-CM | POA: Diagnosis not present

## 2019-10-21 DIAGNOSIS — I25118 Atherosclerotic heart disease of native coronary artery with other forms of angina pectoris: Secondary | ICD-10-CM | POA: Diagnosis not present

## 2019-10-21 DIAGNOSIS — I480 Paroxysmal atrial fibrillation: Secondary | ICD-10-CM | POA: Diagnosis not present

## 2019-10-21 NOTE — Patient Instructions (Addendum)
Medication Instructions:  NO medication changes today. Continue your current medications.   *If you need a refill on your cardiac medications before your next appointment, please call your pharmacy*   Lab Work: No lab work ordered today.   If you have labs (blood work) drawn today and your tests are completely normal, you will receive your results only by: Marland Kitchen MyChart Message (if you have MyChart) OR . A paper copy in the mail If you have any lab test that is abnormal or we need to change your treatment, we will call you to review the results.   Testing/Procedures: Your EKG today looked stable compared to previous. It showed normal sinus rhythm.    Follow-Up: At [facility], you and your health needs are our priority.  As part of our continuing mission to provide you with exceptional heart care, we have created designated Provider Care Teams.  These Care Teams include your primary Cardiologist (physician) and Advanced Practice Providers (APPs -  Physician Assistants and Nurse Practitioners) who all work together to provide you with the care you need, when you need it.  We recommend signing up for the patient portal called "MyChart".  Sign up information is provided on this After Visit Summary.  MyChart is used to connect with patients for Virtual Visits (Telemedicine).  Patients are able to view lab/test results, encounter notes, upcoming appointments, etc.  Non-urgent messages can be sent to your provider as well.   To learn more about what you can do with MyChart, go to [email or web address].    Your next appointment:   6 month(s)  The format for your next appointment:   In Person  Provider:    You may see Julien Nordmann, MD or one of the following Advanced Practice Providers on your designated Care Team:    Nicolasa Ducking, NP  Eula Listen, PA-C  Marisue Ivan, PA-C

## 2019-10-21 NOTE — Progress Notes (Signed)
Office Visit    Patient Name: Mark Dougherty Date of Encounter: 10/21/2019  Primary Care Provider:  Albina Billet, MD Primary Cardiologist:  Ida Rogue, MD Electrophysiologist:  None   Chief Complaint    Mark Dougherty is a 86 y.o. male with a hx of CAD s/p CABG in 1996 in [place], PAD with carotid stenosis, CEA on the left in 2000, DM2, chronic back pain with MRI showing spinal stenosis, HLD, PAF, murmur, chronic shortness of breath presents today for follow-up of PAF and CAD  Past Medical History    Past Medical History:  Diagnosis Date  . A-fib ([place])    a. in perioperative state-->converted to NSR on amiodarone  . Atrial flutter ([place])    a. s/p DCCV 06/29/2015  . BPH (benign prostatic hyperplasia)   . CAD (coronary artery disease)    a. s/p CABG in 1996 in [place]  . Carotid artery stenosis    a. s/p left-sided CEA 2000; b. 02/2016 Carotid U/S: <39% bilat dzs; c. 11/2017 Carotid U/S: 1-39% bilat ICA stenosis.  . Chronic combined systolic and diastolic CHF (congestive heart failure) ([place])    a. echo 2011 with EF 40%; b. 11/2018 Echo: EF 55-60%. Mod conc LVH.  . Diabetes mellitus   . Hyperlipidemia   . Hypotension    mild  . Ischemic cardiomyopathy    a. 2011 Echo: EF 40%; b. 11/2018 Echo: EF 55-60%, mod conc LVH.  Marland Kitchen Left wrist fracture   . Peripheral neuropathy   . Spinal stenosis   . Thyroid disease    a. low TSH and elevated free T4 in June 2015, amiodarone was stopped at that time   Past Surgical History:  Procedure Laterality Date  . BACK SURGERY  1975  . CARDIOVERSION N/A 08/22/2017   Procedure: CARDIOVERSION;  Surgeon: Minna Merritts, MD;  Location: [facility] ORS;  Service: Cardiovascular;  Laterality: N/A;  . CAROTID ENDARTERECTOMY     2010  . CORONARY ARTERY BYPASS GRAFT  1996   x3  . ELECTROPHYSIOLOGIC STUDY N/A 06/29/2015   Procedure: Cardioversion;  Surgeon: Minna Merritts, MD;  Location: [facility] ORS;  Service: Cardiovascular;  Laterality: N/A;  . EYE  SURGERY  2013  . REPLACEMENT TOTAL KNEE     left  . TOOTH EXTRACTION      Allergies  No Known Allergies  History of Present Illness    Mark Dougherty is a 86 y.o. male with a hx of CAD s/p CABG in 1996 in [place], PAD with carotid stenosis, CEA on the left in 2000, DM2, chronic back pain with MRI showing spinal stenosis, HLD, PAF, murmur due to aortic atenosis, chronic shortness of breath last seen 04/07/2019 by Dr. Rockey Situ.  Atrial fib/flutter s/p DCCV in 2017. Previous workup includes mild to moderate aortic stenosis. Most recent echo 8/21 with LVEF 55 to 60%, moderate LVH, RV normal size and function, aortic valve with moderate calcification and no report of aortic stenosis.  Present today with his wife.  Endorses mostly sedentary lifestyle due to chronic back pain.  Reports no chest pain, pressure, tightness.  Reports no shortness of breath at rest.  Reports his dyspnea on exertion is stable and he attributes this to being "86 years old ".  He denies lightheadedness, dizziness, near syncope.  EKGs/Labs/Other Studies Reviewed:   The following studies were reviewed today:  EKG:  EKG is ordered today.  The ekg ordered today demonstrates NSR 78 bpm with pulmonary disease patterna nd  stable LAFB.  Recent Labs: 12/21/2018: ALT 10 02/08/2019: BUN 23; Creatinine, Ser 1.05; Hemoglobin 10.8; Platelets 227; Potassium 3.8; Sodium 136  Recent Lipid Panel No results found for: CHOL, TRIG, HDL, CHOLHDL, VLDL, LDLCALC, LDLDIRECT  Home Medications   Current Meds  Medication Sig  . ELIQUIS 5 MG TABS tablet TAKE 1 TABLET BY MOUTH TWICE DAILY  . finasteride (PROSCAR) 5 MG tablet Take 5 mg by mouth daily.  . folic acid (FOLVITE) 1 MG tablet Take 1 mg by mouth daily.    . metFORMIN (GLUCOPHAGE) 500 MG tablet Take 500 mg by mouth 2 (two) times daily with a meal.    . Multiple Vitamins-Minerals (CENTRUM PO) Take 1 tablet by mouth daily.  . simvastatin (ZOCOR) 20 MG tablet TAKE ONE TABLET BY MOUTH  EVERY NIGHT AT BEDTIME (Patient taking differently: TAKE 20 MG BY MOUTH EVERY NIGHT AT BEDTIME)  . terazosin (HYTRIN) 1 MG capsule Take 1 mg by mouth at bedtime.      Review of Systems       Review of Systems  Constitutional: Negative for chills, fever and malaise/fatigue.  Cardiovascular: Negative for chest pain, dyspnea on exertion, irregular heartbeat, leg swelling, near-syncope, orthopnea, palpitations and syncope.  Respiratory: Negative for cough, shortness of breath and wheezing.   Gastrointestinal: Negative for melena, nausea and vomiting.  Genitourinary: Negative for hematuria.  Neurological: Negative for dizziness, light-headedness and weakness.   All other systems reviewed and are otherwise negative except as noted above.  Physical Exam    VS:  BP 134/80 (BP Location: Left Arm, Patient Position: Sitting, Cuff Size: Normal)   Pulse 78   Ht 5\' 11"  (1.803 m)   Wt 180 lb (81.6 kg)   SpO2 98%   BMI 25.10 kg/m  , BMI Body mass index is 25.1 kg/m. GEN: Well nourished, well developed, in no acute distress. HEENT: normal. Neck: Supple, no JVD, carotid bruits, or masses. Cardiac: RRR, no  rubs, or gallops. Gr 2/6 systolic murmur. No clubbing, cyanosis, edema.  Radials/DP/PT 2+ and equal bilaterally.  Respiratory:  Respirations regular and unlabored, clear to auscultation bilaterally. GI: Soft, nontender, nondistended, BS + x 4. MS: No deformity or atrophy. Skin: Warm and dry, no rash. Neuro:  Strength and sensation are intact. Psych: Normal affect. Assessment & Plan    1. CAD -no new symptoms.  No repeated ischemic evaluation indicated this time.  GDMT includes statin.  No aspirin secondary to chronic anticoagulation. 2. Valvular heart disease -history of aortic stenosis.  Systolic murmur noted on exam.  As he is asymptomatic with no lightheadedness, shortness of breath, chest pain and surgical repair likely not an option due to age, comorbidities will defer repeat  echocardiogram. 3. PAF/chronic anticoagulation -maintain normal sinus rhythm.  Continue Eliquis 5 mg twice daily.  Does not meet dose reduction criteria.  Denies bleeding complications.  Disposition: Follow up in 6 month(s) with Dr. or APP  Mariah Milling, NP 10/21/2019, 4:48 PM

## 2020-01-26 ENCOUNTER — Encounter: Payer: Self-pay | Admitting: Emergency Medicine

## 2020-01-26 ENCOUNTER — Emergency Department: Payer: Medicare Other

## 2020-01-26 ENCOUNTER — Other Ambulatory Visit: Payer: Self-pay

## 2020-01-26 ENCOUNTER — Emergency Department
Admission: EM | Admit: 2020-01-26 | Discharge: 2020-01-26 | Disposition: A | Discharge status: Home / Self Care | Payer: Medicare Other | Attending: Emergency Medicine | Admitting: Emergency Medicine

## 2020-01-26 DIAGNOSIS — I509 Heart failure, unspecified: Secondary | ICD-10-CM | POA: Diagnosis not present

## 2020-01-26 DIAGNOSIS — E119 Type 2 diabetes mellitus without complications: Secondary | ICD-10-CM | POA: Diagnosis not present

## 2020-01-26 DIAGNOSIS — I110 Hypertensive heart disease with heart failure: Secondary | ICD-10-CM | POA: Diagnosis not present

## 2020-01-26 DIAGNOSIS — R000 Tachycardia, unspecified: Secondary | ICD-10-CM | POA: Insufficient documentation

## 2020-01-26 DIAGNOSIS — I471 Supraventricular tachycardia: Secondary | ICD-10-CM | POA: Diagnosis not present

## 2020-01-26 DIAGNOSIS — Z79899 Other long term (current) drug therapy: Secondary | ICD-10-CM | POA: Insufficient documentation

## 2020-01-26 DIAGNOSIS — Z7984 Long term (current) use of oral hypoglycemic drugs: Secondary | ICD-10-CM | POA: Insufficient documentation

## 2020-01-26 DIAGNOSIS — I480 Paroxysmal atrial fibrillation: Secondary | ICD-10-CM

## 2020-01-26 DIAGNOSIS — I2510 Atherosclerotic heart disease of native coronary artery without angina pectoris: Secondary | ICD-10-CM | POA: Insufficient documentation

## 2020-01-26 DIAGNOSIS — R778 Other specified abnormalities of plasma proteins: Secondary | ICD-10-CM | POA: Diagnosis not present

## 2020-01-26 LAB — BASIC METABOLIC PANEL
Anion gap: 8 (ref 5–15)
BUN: 28 mg/dL — ABNORMAL HIGH (ref 8–23)
CO2: 24 mmol/L (ref 22–32)
Calcium: 9 mg/dL (ref 8.9–10.3)
Chloride: 106 mmol/L (ref 98–111)
Creatinine, Ser: 1.37 mg/dL — ABNORMAL HIGH (ref 0.61–1.24)
GFR calc Af Amer: 52 mL/min — ABNORMAL LOW (ref >60)
GFR calc non Af Amer: 45 mL/min — ABNORMAL LOW (ref >60)
Glucose, Bld: 162 mg/dL — ABNORMAL HIGH (ref 70–99)
Potassium: 3.9 mmol/L (ref 3.5–5.1)
Sodium: 138 mmol/L (ref 135–145)

## 2020-01-26 LAB — CBC WITH DIFFERENTIAL/PLATELET
Abs Immature Granulocytes: 0.03 10*3/uL (ref 0.00–0.07)
Basophils Absolute: 0 10*3/uL (ref 0.0–0.1)
Basophils Relative: 1 %
Eosinophils Absolute: 0.2 10*3/uL (ref 0.0–0.5)
Eosinophils Relative: 2 %
HCT: 32.1 % — ABNORMAL LOW (ref 39.0–52.0)
Hemoglobin: 10.3 g/dL — ABNORMAL LOW (ref 13.0–17.0)
Immature Granulocytes: 0 %
Lymphocytes Relative: 40 %
Lymphs Abs: 2.9 10*3/uL (ref 0.7–4.0)
MCH: 30.3 pg (ref 26.0–34.0)
MCHC: 32.1 g/dL (ref 30.0–36.0)
MCV: 94.4 fL (ref 80.0–100.0)
Monocytes Absolute: 0.8 10*3/uL (ref 0.1–1.0)
Monocytes Relative: 11 %
Neutro Abs: 3.3 10*3/uL (ref 1.7–7.7)
Neutrophils Relative %: 46 %
Platelets: 265 10*3/uL (ref 150–400)
RBC: 3.4 MIL/uL — ABNORMAL LOW (ref 4.22–5.81)
RDW: 13.7 % (ref 11.5–15.5)
WBC: 7.1 10*3/uL (ref 4.0–10.5)
nRBC: 0 % (ref 0.0–0.2)

## 2020-01-26 LAB — TROPONIN I (HIGH SENSITIVITY)
Troponin I (High Sensitivity): 243 ng/L (ref <18)
Troponin I (High Sensitivity): 302 ng/L (ref <18)

## 2020-01-26 LAB — MAGNESIUM: Magnesium: 1.8 mg/dL (ref 1.7–2.4)

## 2020-01-26 MED ORDER — METOPROLOL TARTRATE 25 MG PO TABS
25.0000 mg | ORAL_TABLET | Freq: Once | ORAL | Status: AC
  Administered 2020-01-26: 25 mg via ORAL
  Filled 2020-01-26: qty 1

## 2020-01-26 MED ORDER — METOPROLOL TARTRATE 25 MG PO TABS: 12.5000 mg | ORAL_TABLET | Freq: Two times a day (BID) | ORAL | 3 refills | Status: DC

## 2020-01-26 NOTE — ED Notes (Signed)
Attempted to return call to wife X 2 but call will not go through with number in system.

## 2020-01-26 NOTE — ED Triage Notes (Signed)
Pt found unresponsive in wheelchair at home. Pt was SVT when EMS arrived with low bp in 80s.  Given 6 mg adenosine by EMS and converted to ST.  Pt answering questions at this time.

## 2020-01-26 NOTE — ED Provider Notes (Signed)
[facility] Emergency Department Provider Note   ____________________________________________   First MD Initiated Contact with Patient 01/26/20 [phone]     (approximate)  I have reviewed the triage vital signs and the nursing notes.   HISTORY  Chief Complaint Tachycardia    HPI Mark Dougherty is a 86 y.o. male with past medical history of CAD status post CABG, CHF, PAF on Eliquis, hyperlipidemia, diabetes, and chronic back pain who presents to the ED for tachycardia.  Per EMS, patient was found minimally responsive in his wheelchair at home.  He was noted to be in SVT with systolic blood pressure in the 80s, subsequently given 6 mg of adenosine and converted to sinus tachycardia.  After he converted, he woke up and began answering questions appropriately.  On arrival to the ED, he is awake and alert and complains only of chronic lower back pain.  He states this pain has been ongoing for multiple years and he takes medication regularly for it.  He is wheelchair-bound at baseline and denies any new numbness or weakness in his lower extremities.  He states he has been otherwise feeling well recently with no fevers, cough, chest pain, or shortness of breath.  He does not remember the episode earlier today or receiving the adenosine with EMS.        Past Medical History:  Diagnosis Date  . A-fib (HCC)    a. in perioperative state-->converted to NSR on amiodarone  . Atrial flutter (HCC)    a. s/p DCCV 06/29/2015  . BPH (benign prostatic hyperplasia)   . CAD (coronary artery disease)    a. s/p CABG in 1996 in [place]  . Carotid artery stenosis    a. s/p left-sided CEA 2000; b. 02/2016 Carotid U/S: <39% bilat dzs; c. 11/2017 Carotid U/S: 1-39% bilat ICA stenosis.  . Chronic combined systolic and diastolic CHF (congestive heart failure) (HCC)    a. echo 2011 with EF 40%; b. 11/2018 Echo: EF 55-60%. Mod conc LVH.  . Diabetes mellitus   . Hyperlipidemia   . Hypotension     mild  . Ischemic cardiomyopathy    a. 2011 Echo: EF 40%; b. 11/2018 Echo: EF 55-60%, mod conc LVH.  Marland Kitchen Left wrist fracture   . Peripheral neuropathy   . Spinal stenosis   . Thyroid disease    a. low TSH and elevated free T4 in June 2015, amiodarone was stopped at that time    Patient Active Problem List   Diagnosis Date Noted  . Knee injury 12/21/2018  . Unilateral primary osteoarthritis, left knee 11/28/2017  . Unilateral primary osteoarthritis, right knee 11/28/2017  . Syncope 11/29/2016  . Acute left-sided low back pain without sciatica 11/12/2016  . Chronic systolic CHF (congestive heart failure) (HCC)   . Ischemic cardiomyopathy   . Thyroid disease   . Typical atrial flutter (HCC)   . Atrial flutter (HCC) 05/31/2015  . Neuropathy 05/31/2015  . PAF (paroxysmal atrial fibrillation) (HCC) 01/04/2011  . Congestive dilated cardiomyopathy (HCC) 09/28/2010  . Dehydration 09/14/2010  . Diabetes mellitus type 2, controlled, with complications (HCC) 01/09/2010  . Hyperlipidemia 01/09/2010  . CAD (coronary artery disease) 01/09/2010  . Carotid stenosis 01/09/2010    Past Surgical History:  Procedure Laterality Date  . BACK SURGERY  1975  . CARDIOVERSION N/A 08/22/2017   Procedure: CARDIOVERSION;  Surgeon: Antonieta Iba, MD;  Location: [facility] ORS;  Service: Cardiovascular;  Laterality: N/A;  . CAROTID ENDARTERECTOMY     2010  .  CORONARY ARTERY BYPASS GRAFT  1996   x3  . ELECTROPHYSIOLOGIC STUDY N/A 06/29/2015   Procedure: Cardioversion;  Surgeon: Antonieta Iba, MD;  Location: [facility] ORS;  Service: Cardiovascular;  Laterality: N/A;  . EYE SURGERY  2013  . REPLACEMENT TOTAL KNEE     left  . TOOTH EXTRACTION      Prior to Admission medications   Medication Sig Start Date End Date Taking? Authorizing Provider  ELIQUIS 5 MG TABS tablet TAKE 1 TABLET BY MOUTH TWICE DAILY Patient taking differently: Take 5 mg by mouth 2 (two) times daily.  01/16/19   Dunn, Raymon Mutton, PA-C    finasteride (PROSCAR) 5 MG tablet Take 5 mg by mouth daily.    [provider]  folic acid (FOLVITE) 1 MG tablet Take 1 mg by mouth daily.      [provider]  metFORMIN (GLUCOPHAGE) 500 MG tablet Take 500 mg by mouth 2 (two) times daily with a meal.      [provider]  metoprolol tartrate (LOPRESSOR) 25 MG tablet Take 0.5 tablets (12.5 mg total) by mouth 2 (two) times daily. 01/26/20 01/25/21  Chesley Noon, MD  Multiple Vitamins-Minerals (CENTRUM PO) Take 1 tablet by mouth daily.    [provider]  simvastatin (ZOCOR) 20 MG tablet TAKE ONE TABLET BY MOUTH EVERY NIGHT AT BEDTIME Patient taking differently: TAKE 20 MG BY MOUTH EVERY NIGHT AT BEDTIME 02/04/17   Antonieta Iba, MD  terazosin (HYTRIN) 1 MG capsule Take 1 mg by mouth at bedtime.    [provider]    Allergies Patient has no known allergies.  Family History  Family history unknown: Yes    Social History Social History   Tobacco Use  . Smoking status: Never Smoker  . Smokeless tobacco: Former Engineer, water Use Topics  . Alcohol use: No  . Drug use: No    Review of Systems  Constitutional: No fever/chills Eyes: No visual changes. ENT: No sore throat. Cardiovascular: Denies chest pain. Respiratory: Denies shortness of breath. Gastrointestinal: No abdominal pain.  No nausea, no vomiting.  No diarrhea.  No constipation. Genitourinary: Negative for dysuria. Musculoskeletal: Positive for back pain. Skin: Negative for rash. Neurological: Negative for headaches, focal weakness or numbness.  ____________________________________________   PHYSICAL EXAM:  VITAL SIGNS: ED Triage Vitals  Enc Vitals Group     BP 01/26/20 0759 136/78     Pulse Rate 01/26/20 0759 94     Resp 01/26/20 0759 (!) 22     Temp --      Temp src --      SpO2 01/26/20 0759 100 %     Weight 01/26/20 0757 190 lb (86.2 kg)     Height 01/26/20 0757 6\' 1"  (1.854 m)     Head Circumference --       Peak Flow --      Pain Score --      Pain Loc --      Pain Edu? --      Excl. in GC? --     Constitutional: Alert and oriented to person, place, time, and situation. Eyes: Conjunctivae are normal. Head: Atraumatic. Nose: No congestion/rhinnorhea. Mouth/Throat: Mucous membranes are moist. Neck: Normal ROM Cardiovascular: Normal rate, regular rhythm. Grossly normal heart sounds.  2+ radial pulses bilaterally. Respiratory: Normal respiratory effort.  No retractions. Lungs CTAB. Gastrointestinal: Soft and nontender. No distention. Genitourinary: deferred Musculoskeletal: No lower extremity tenderness nor edema. Neurologic:  Normal speech and language. No  gross focal neurologic deficits are appreciated. Skin:  Skin is warm, dry and intact. No rash noted. Psychiatric: Mood and affect are normal. Speech and behavior are normal.  ____________________________________________   LABS (all labs ordered are listed, but only abnormal results are displayed)  Labs Reviewed  CBC WITH DIFFERENTIAL/PLATELET - Abnormal; Notable for the following components:      Result Value   RBC 3.40 (*)    Hemoglobin 10.3 (*)    HCT 32.1 (*)    All other components within normal limits  BASIC METABOLIC PANEL - Abnormal; Notable for the following components:   Glucose, Bld 162 (*)    BUN 28 (*)    Creatinine, Ser 1.37 (*)    GFR calc non Af Amer 45 (*)    GFR calc Af Amer 52 (*)    All other components within normal limits  TROPONIN I (HIGH SENSITIVITY) - Abnormal; Notable for the following components:   Troponin I (High Sensitivity) 243 (*)    All other components within normal limits  TROPONIN I (HIGH SENSITIVITY) - Abnormal; Notable for the following components:   Troponin I (High Sensitivity) 302 (*)    All other components within normal limits  MAGNESIUM   ____________________________________________  EKG  ED ECG REPORT I, Chesley Noon, the attending physician, personally viewed and  interpreted this ECG.   Date: 01/26/2020  EKG Time: 7:56  Rate: 91  Rhythm: normal sinus rhythm  Axis: LAD  Intervals:first-degree A-V block  and left anterior fascicular block  ST&T Change: None   PROCEDURES  Procedure(s) performed (including Critical Care):  Procedures   ____________________________________________   INITIAL IMPRESSION / ASSESSMENT AND PLAN / ED COURSE       86 year old male with past medical history of CAD status post CABG, CHF, PAF on Eliquis, hyperlipidemia, diabetes, and chronic back pain who presents to the ED following episode of unresponsiveness during which she was found to be in SVT.  He converted to normal sinus rhythm with administration of adenosine, is now awake and alert with no focal neurologic deficits.  He complains of chronic back pain, but otherwise denies any acute complaints.  No findings to suggest cauda equina at this time.  EKG shows normal sinus rhythm with no acute ischemic changes, we will screen labs including electrolytes, troponin.  He has no infectious sounding symptoms at this time.  We will observe on the cardiac monitor for any recurrence of SVT.  Patient has had no recurrence of his SVT and has remained symptom-free here in the ED.  Troponin is elevated and slightly uptrending on recheck, however this is likely related to his high heart rate earlier given he has been asymptomatic since then.  Remainder of blood work is reassuring, patient did complain of some pain around his right ankle however x-ray is negative and there are no signs of infection.  Patient was evaluated by Dr. And of cardiology, who agrees with plan for discharge home and we will start patient on low-dose metoprolol at 12.5mg  twice daily.  He may follow-up closely with cardiology as an outpatient and was counseled to return to the ED for new or worsening symptoms, patient agrees with plan.      ____________________________________________   FINAL CLINICAL  IMPRESSION(S) / ED DIAGNOSES  Final diagnoses:  SVT (supraventricular tachycardia) [facility])     ED Discharge Orders         Ordered    metoprolol tartrate (LOPRESSOR) 25 MG tablet  2 times daily  01/26/20 1202           Note:  This document was prepared using Dragon voice recognition software and may include unintentional dictation errors.   Chesley Noon, MD 01/26/20 1204
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
## 2020-01-26 NOTE — ED Notes (Signed)
In radiology

## 2020-01-29 NOTE — Progress Notes (Signed)
Evaluation Performed:  Follow-up visit  Date:  02/01/2020   ID:  Mark Dougherty, DOB 07/11/1926, MRN [id number]  Patient Location:  [address] [place] [address]   Provider location:   [facility], [place] office  PCP:  [name], MD  Cardiologist:  [facility]   Chief Complaint  Patient presents with  . OTHER    F/u hospital SVT. Meds reviewed verbally with pt.    History of Present Illness:    Mark Dougherty is a 86 y.o. male  past medical history of coronary artery disease,  bypass surgery in 1996 in [place],  PAD with carotid stenosis, CEA on the left in 2000 on the left,  diabetes,  severe chronic back pain with MRI showing spinal stenosis,  hyperlipidemia,  back surgery 4 to 5 years ago with complications including atrial fibrillation in the perioperative, postoperative period started on amiodarone with conversion to sinus rhythm, with a repeat admission to [facility] for dehydration, hypotension and malaise.  hospital admission February 2016 for failure to thrive, dehydration. Previous EKG showing atrial flutter, underwent cardioversion 3/2017which was successful in restoring normal sinus rhythm echocardiogram 2011 showing ejection fraction 40% EF 55 to 60% in 11/2018 Chronic shortness of breath, back pain, debility He presents for routine followup of his coronary artery disease and syncope  Last seen in clinic December 2020 by myself He had been seen in the emergency room October 2020 for weakness, was given IV fluids and discharged home Complaint of shortness of breath, was not very active, had chronic back pain, using a walker at the time  Worsening weakness In wheelchair, severe back pain  Seen in the emergency room January 26, 2020 for SVT Was minimally responsive in wheelchair Noted to be in SVT, systolic pressure in the 80s, given adenosine converted to sinus tachycardia After converting he woke up was  more responsive Troponin 300 Started on low-dose metoprolol twice daily  He has had a history of acutely having a blank stare,, " going out of it" per the wife that seems to resolve after minute In hindsight she wonders if it could have been from a cardiac arrhythmia as detailed above  Lab work reviewed Hemoglobin 10.3 Creatinine 1.37 BUN 28  EKG personally reviewed by myself on todays visit Normal sinus rhythm with rate 71 bpm R wave progression to the anterior precordial leads, left anterior fascicular block  No past medical history reviewed Cardioversion 07/2017,  Denies arrhythmia  Episode of syncope July  2018 UTI Carotid ultrasound less than 49% proximal right carotid disease, no significant stenosis on the left, Vertebral arteries patent Echocardiogram ejection fraction 55-60%, mild aortic valve stenosis Recent event monitor with one run of nonsustained VT 9 beats, no symptoms    Prior CV studies:   The following studies were reviewed today:  Echo 11/2016 - Left ventricle: The cavity size was normal. Systolic function was   normal. The estimated ejection fraction was in the range of 55%   to 60%. Wall motion was normal; there were no regional wall   motion abnormalities. Doppler parameters are consistent with   abnormal left ventricular relaxation (grade 1 diastolic   dysfunction). - Aortic valve: severely calcified leaflets. There was mild to   moderate stenosis by estimated AVA. Stenosis is borderline mild   by mean gradient and peak velocity. Valve area (VTI): 1.32 cm^2. - Left atrium: The atrium was mildly dilated. - Right  ventricle: Systolic function was normal. - Pulmonary arteries: Systolic pressure was within the normal   range.  Past Medical History:  Diagnosis Date  . A-fib (HCC)    a. in perioperative state-->converted to NSR on amiodarone  . Atrial flutter (HCC)    a. s/p DCCV 06/29/2015  . BPH (benign prostatic hyperplasia)   . CAD (coronary artery  disease)    a. s/p CABG in 1996 in [place]  . Carotid artery stenosis    a. s/p left-sided CEA 2000; b. 02/2016 Carotid U/S: <39% bilat dzs; c. 11/2017 Carotid U/S: 1-39% bilat ICA stenosis.  . Chronic combined systolic and diastolic CHF (congestive heart failure) (HCC)    a. echo 2011 with EF 40%; b. 11/2018 Echo: EF 55-60%. Mod conc LVH.  . Diabetes mellitus   . Hyperlipidemia   . Hypotension    mild  . Ischemic cardiomyopathy    a. 2011 Echo: EF 40%; b. 11/2018 Echo: EF 55-60%, mod conc LVH.  Marland Kitchen Left wrist fracture   . Peripheral neuropathy   . Spinal stenosis   . Thyroid disease    a. low TSH and elevated free T4 in June 2015, amiodarone was stopped at that time   Past Surgical History:  Procedure Laterality Date  . BACK SURGERY  1975  . CARDIOVERSION N/A 08/22/2017   Procedure: CARDIOVERSION;  Surgeon: Antonieta Iba, MD;  Location: [facility] ORS;  Service: Cardiovascular;  Laterality: N/A;  . CAROTID ENDARTERECTOMY     2010  . CORONARY ARTERY BYPASS GRAFT  1996   x3  . ELECTROPHYSIOLOGIC STUDY N/A 06/29/2015   Procedure: Cardioversion;  Surgeon: Antonieta Iba, MD;  Location: [facility] ORS;  Service: Cardiovascular;  Laterality: N/A;  . EYE SURGERY  2013  . REPLACEMENT TOTAL KNEE     left  . TOOTH EXTRACTION       Current Meds  Medication Sig  . ELIQUIS 5 MG TABS tablet TAKE 1 TABLET BY MOUTH TWICE DAILY (Patient taking differently: Take 5 mg by mouth 2 (two) times daily. )  . finasteride (PROSCAR) 5 MG tablet Take 5 mg by mouth daily.  . folic acid (FOLVITE) 1 MG tablet Take 1 mg by mouth daily.    . metFORMIN (GLUCOPHAGE) 500 MG tablet Take 500 mg by mouth 2 (two) times daily with a meal.    . metoprolol tartrate (LOPRESSOR) 25 MG tablet Take 1 tablet (25 mg total) by mouth 2 (two) times daily.  . Multiple Vitamins-Minerals (CENTRUM PO) Take 1 tablet by mouth daily.  . simvastatin (ZOCOR) 20 MG tablet TAKE ONE TABLET BY MOUTH EVERY NIGHT AT BEDTIME (Patient taking differently: TAKE  20 MG BY MOUTH EVERY NIGHT AT BEDTIME)  . terazosin (HYTRIN) 1 MG capsule Take 1 mg by mouth at bedtime.  . [DISCONTINUED] metoprolol tartrate (LOPRESSOR) 25 MG tablet Take 0.5 tablets (12.5 mg total) by mouth 2 (two) times daily.     Allergies:   Patient has no known allergies.   Social History   Tobacco Use  . Smoking status: Never Smoker  . Smokeless tobacco: Former Engineer, water Use Topics  . Alcohol use: No  . Drug use: No     Current Outpatient Medications on File Prior to Visit  Medication Sig Dispense Refill  . ELIQUIS 5 MG TABS tablet TAKE 1 TABLET BY MOUTH TWICE DAILY (Patient taking differently: Take 5 mg by mouth 2 (two) times daily. ) 180 tablet 1  . finasteride (PROSCAR) 5 MG tablet Take 5 mg  by mouth daily.    . folic acid (FOLVITE) 1 MG tablet Take 1 mg by mouth daily.      . metFORMIN (GLUCOPHAGE) 500 MG tablet Take 500 mg by mouth 2 (two) times daily with a meal.      . Multiple Vitamins-Minerals (CENTRUM PO) Take 1 tablet by mouth daily.    . simvastatin (ZOCOR) 20 MG tablet TAKE ONE TABLET BY MOUTH EVERY NIGHT AT BEDTIME (Patient taking differently: TAKE 20 MG BY MOUTH EVERY NIGHT AT BEDTIME) 30 tablet 3  . terazosin (HYTRIN) 1 MG capsule Take 1 mg by mouth at bedtime.     No current facility-administered medications on file prior to visit.     Family Hx: The patient's Family history is unknown by patient.  ROS:   Please see the history of present illness.    Review of Systems  Constitutional: Negative.   HENT: Negative.   Respiratory: Positive for shortness of breath.   Cardiovascular: Negative.   Gastrointestinal: Negative.   Musculoskeletal: Positive for back pain.       Unsteady gait  Neurological: Negative.   Psychiatric/Behavioral: Negative.   All other systems reviewed and are negative.    Labs/Other Tests and Data Reviewed:    Recent Labs: 01/26/2020: BUN 28; Creatinine, Ser 1.37; Hemoglobin 10.3; Magnesium 1.8; Platelets 265; Potassium  3.9; Sodium 138   Recent Lipid Panel No results found for: CHOL, TRIG, HDL, CHOLHDL, LDLCALC, LDLDIRECT  Wt Readings from Last 3 Encounters:  02/01/20 203 lb (92.1 kg)  01/26/20 190 lb (86.2 kg)  10/21/19 180 lb (81.6 kg)     Exam:    Vital Signs:  BP 120/60 (BP Location: Left Arm, Patient Position: Sitting, Cuff Size: Normal)   Pulse 71   Ht 5\' 11"  (1.803 m)   Wt 203 lb (92.1 kg)   SpO2 96%   BMI 28.31 kg/m   Constitutional:  oriented to person, place, and time. No distress.  Presents today in a wheelchair HENT:  Head: Grossly normal Eyes:  no discharge. No scleral icterus.  Neck: No JVD, no carotid bruits  Cardiovascular: Regular rate and rhythm, no murmurs appreciated Pulmonary/Chest: Clear to auscultation bilaterally, no wheezes or rails Abdominal: Soft.  no distension.  no tenderness.  Musculoskeletal: Normal range of motion Neurological:  normal muscle tone. Coordination normal. No atrophy Skin: Skin warm and dry Psychiatric: normal affect, pleasant   ASSESSMENT & PLAN:    Coronary artery disease of native artery of native heart with stable angina pectoris (HCC) -  Recent troponin elevation in the setting of SVT, rapid rate No further work-up at this time, denies angina  Paroxysmal atrial fibrillation (HCC) - EKG maintaining normal sinus rhythm On anticoagulation  SVT Symptomatic episode, broke with adenosine Recommend increase metoprolol to heart rate up to 25 twice daily Discussed adenosine, carotid sinus massage For further episodes may need antiarrhythmic such as amiodarone  Ischemic cardiomyopathy - Ejection fraction normalized to 55% Denies any anginal symptoms  Chronic systolic CHF (congestive heart failure) (HCC) - Appears prerenal on recent lab work, Avoid diuretic at this time  Bilateral carotid artery stenosis -  Prior ultrasound August 2019 Less than 39% bilaterally  Controlled type 2 diabetes mellitus with complication, without  long-term current use of insulin (HCC) - Managed by primary care A1c typically well controlled  Mixed hyperlipidemia - Cholesterol at goal, no medication changes made  Typical atrial flutter (HCC) - Prior cardioversion x2 in 2017 and 2019 On eliquis Maintaining normal sinus rhythm  Hospital records reviewed Long discussion concerning SVT, management  Total encounter time more than 25 minutes  Greater than 50% was spent in counseling and coordination of care with the patient    Signed, Julien Nordmann, MD  02/01/2020 2:32 PM    [facility] [address], [place], [place] [address]

## 2020-02-01 ENCOUNTER — Other Ambulatory Visit: Payer: Self-pay

## 2020-02-01 ENCOUNTER — Ambulatory Visit (INDEPENDENT_AMBULATORY_CARE_PROVIDER_SITE_OTHER): Payer: Medicare Other | Admitting: Cardiovascular Disease

## 2020-02-01 ENCOUNTER — Encounter: Payer: Self-pay | Admitting: Cardiovascular Disease

## 2020-02-01 VITALS — BP 120/60 | HR 71 | Ht 71.0 in | Wt 203.0 lb

## 2020-02-01 DIAGNOSIS — I6523 Occlusion and stenosis of bilateral carotid arteries: Secondary | ICD-10-CM

## 2020-02-01 DIAGNOSIS — I25118 Atherosclerotic heart disease of native coronary artery with other forms of angina pectoris: Secondary | ICD-10-CM | POA: Diagnosis not present

## 2020-02-01 DIAGNOSIS — I350 Nonrheumatic aortic (valve) stenosis: Secondary | ICD-10-CM | POA: Diagnosis not present

## 2020-02-01 DIAGNOSIS — I5022 Chronic systolic (congestive) heart failure: Secondary | ICD-10-CM

## 2020-02-01 DIAGNOSIS — I480 Paroxysmal atrial fibrillation: Secondary | ICD-10-CM | POA: Diagnosis not present

## 2020-02-01 DIAGNOSIS — I255 Ischemic cardiomyopathy: Secondary | ICD-10-CM

## 2020-02-01 DIAGNOSIS — E118 Type 2 diabetes mellitus with unspecified complications: Secondary | ICD-10-CM

## 2020-02-01 DIAGNOSIS — E782 Mixed hyperlipidemia: Secondary | ICD-10-CM

## 2020-02-01 MED ORDER — METOPROLOL TARTRATE 25 MG PO TABS: 25.0000 mg | ORAL_TABLET | Freq: Two times a day (BID) | ORAL | 3 refills | Status: DC

## 2020-02-01 NOTE — Patient Instructions (Addendum)
SVT (supraventricular tachycardia) For episodes, you will need adenosine, given by EMTs or ER by IV  Medication Instructions:  Please increase the metoprolol up to 25 mg twice a day  If you need a refill on your cardiac medications before your next appointment, please call your pharmacy.    Lab work: No new labs needed   If you have labs (blood work) drawn today and your tests are completely normal, you will receive your results only by: Marland Kitchen MyChart Message (if you have MyChart) OR . A paper copy in the mail If you have any lab test that is abnormal or we need to change your treatment, we will call you to review the results.   Testing/Procedures: No new testing needed   Follow-Up: At [facility], you and your health needs are our priority.  As part of our continuing mission to provide you with exceptional heart care, we have created designated Provider Care Teams.  These Care Teams include your primary Cardiologist (physician) and Advanced Practice Providers (APPs -  Physician Assistants and Nurse Practitioners) who all work together to provide you with the care you need, when you need it.  . You will need a follow up appointment in 6 months  . Providers on your designated Care Team:   . Nicolasa Ducking, NP . Eula Listen, PA-C . Marisue Ivan, PA-C  Any Other Special Instructions Will Be Listed Below (If Applicable).  COVID-19 Vaccine Information can be found at: [employer] For questions related to vaccine distribution or appointments, please email [email or web address] or call [phone].

## 2020-02-08 ENCOUNTER — Encounter: Payer: Self-pay | Admitting: Cardiovascular Disease

## 2020-02-18 ENCOUNTER — Telehealth: Payer: Self-pay

## 2020-02-18 DIAGNOSIS — M1712 Unilateral primary osteoarthritis, left knee: Secondary | ICD-10-CM

## 2020-02-18 DIAGNOSIS — G8929 Other chronic pain: Secondary | ICD-10-CM

## 2020-02-18 DIAGNOSIS — M1711 Unilateral primary osteoarthritis, right knee: Secondary | ICD-10-CM

## 2020-02-18 NOTE — Telephone Encounter (Signed)
That will be fine. 

## 2020-02-18 NOTE — Telephone Encounter (Signed)
Patient called he stated he has a hoveround and he needs a belt for it. Patient is requesting Dr.Blackman to approve and send order in for it. Call back:[phone]

## 2020-02-18 NOTE — Telephone Encounter (Signed)
Ok for this? 

## 2020-02-19 NOTE — Addendum Note (Signed)
Addended by: Fredda Hammed L on: 02/19/2020 11:15 AM   Modules accepted: Orders

## 2020-02-19 NOTE — Telephone Encounter (Signed)
Can you please take care of this for patient? Thanks.

## 2020-02-19 NOTE — Telephone Encounter (Signed)
I contacted pt and was given the fax # of [phone] to fax the rx to. This was completed and faxed

## 2020-02-29 ENCOUNTER — Telehealth: Payer: Self-pay

## 2020-02-29 NOTE — Telephone Encounter (Signed)
Patient called in wanting to get pmd for 2021 for seatbelt on hoverround

## 2020-02-29 NOTE — Telephone Encounter (Signed)
I'm gonna send this one to you, since you are already working on it

## 2020-02-29 NOTE — Telephone Encounter (Signed)
Called and advised pt on status

## 2020-02-29 NOTE — Telephone Encounter (Signed)
Called hoverround and they stated they never received the order. I got another fax # from her and refaxed it. She stated she was making a note to look out for it.

## 2020-03-07 ENCOUNTER — Telehealth: Payer: Self-pay | Admitting: Orthopaedic Surgery

## 2020-03-07 NOTE — Telephone Encounter (Signed)
I tried to go into the chart from December 30, 2018.  Epic does not allow you to go into the chart that far back in terms of making any addendum.  I could not do that.  We can certainly put that on a prescription pad stating that the patient does use his Hoveround daily.  I do not know what else to do other than seeing him in the office again.

## 2020-03-07 NOTE — Telephone Encounter (Signed)
Received vm from [facility]. Stated since no recent ov note, the last ov note (12/30/2018) can be amended to state that patient uses the Hoveround daily. Fax [phone]. Ph [phone]

## 2020-03-08 ENCOUNTER — Telehealth: Payer: Self-pay | Admitting: Orthopaedic Surgery

## 2020-03-08 NOTE — Telephone Encounter (Signed)
Patients wife calling about Hoveround and what they need for the needed repairs. Please call wife Mrs. Pogue [phone]

## 2020-03-08 NOTE — Telephone Encounter (Signed)
Called and scheduled appointment for Monday for this note

## 2020-03-08 NOTE — Telephone Encounter (Signed)
Appointment scheduled for monday

## 2020-03-08 NOTE — Telephone Encounter (Signed)
I will call pt to bring in for an appointment

## 2020-03-14 ENCOUNTER — Encounter: Payer: Self-pay | Admitting: Orthopaedic Surgery

## 2020-03-14 ENCOUNTER — Ambulatory Visit (INDEPENDENT_AMBULATORY_CARE_PROVIDER_SITE_OTHER): Payer: Medicare Other | Admitting: Orthopaedic Surgery

## 2020-03-14 ENCOUNTER — Telehealth: Payer: Self-pay

## 2020-03-14 ENCOUNTER — Other Ambulatory Visit: Payer: Self-pay

## 2020-03-14 DIAGNOSIS — I255 Ischemic cardiomyopathy: Secondary | ICD-10-CM

## 2020-03-14 DIAGNOSIS — M1711 Unilateral primary osteoarthritis, right knee: Secondary | ICD-10-CM

## 2020-03-14 DIAGNOSIS — M1712 Unilateral primary osteoarthritis, left knee: Secondary | ICD-10-CM | POA: Diagnosis not present

## 2020-03-14 DIAGNOSIS — Z9181 History of falling: Secondary | ICD-10-CM | POA: Diagnosis not present

## 2020-03-14 MED ORDER — HYDROCODONE-ACETAMINOPHEN 5-325 MG PO TABS: 1.0000 | ORAL_TABLET | Freq: Four times a day (QID) | ORAL | 0 refills | Status: DC | PRN

## 2020-03-14 NOTE — Progress Notes (Signed)
Patient comes in today for evaluation as relates to his current use of esophagram.  He does use his Hoveround daily.  This was prescribed for some time ago.  He is a severe fall risk.  He has chronic lumbar spine issues and known severe arthritis in both his knees with daily pain.  He is able to use his Hoveround daily in his house.  His house accommodates it well.  He has the manual dexterity to operate a Hoveround.  He is here mainly today because he is Hoveround needs maintenance work in terms of a new safety belt and strap.  He does not walk any significant distance due to the severe pain he has in his back and his legs.  He is 86 years old.  He is asking for refill of hydrocodone.  I last apparently refill this a long time ago.  I recommended to his wife that they consider getting this from his primary care physician.  I would refill it today.  There is been no other acute changes medical status.  On exam today both knees have slight flexion contractures with painful arc of motion.  He has tenderness throughout his spine.  He cannot stand and does have significant balance issues and I tried to stand him.  I agree with his need to continue to use his Hoveround and that it needs repairs since he uses it daily and it needs a new safety belt.  I did send in some hydrocodone.  Follow-up is as needed.

## 2020-03-14 NOTE — Telephone Encounter (Signed)
Need to send note to hoverround

## 2020-03-14 NOTE — Telephone Encounter (Signed)
done

## 2020-04-25 ENCOUNTER — Ambulatory Visit: Payer: Medicare Other | Admitting: Cardiovascular Disease

## 2020-04-28 ENCOUNTER — Ambulatory Visit (INDEPENDENT_AMBULATORY_CARE_PROVIDER_SITE_OTHER): Payer: Medicare Other | Admitting: Specialist

## 2020-04-28 ENCOUNTER — Encounter: Payer: Self-pay | Admitting: Specialist

## 2020-04-28 ENCOUNTER — Ambulatory Visit: Payer: Self-pay

## 2020-04-28 VITALS — BP 128/72 | HR 80 | Ht 71.0 in | Wt 203.0 lb

## 2020-04-28 DIAGNOSIS — M545 Low back pain, unspecified: Secondary | ICD-10-CM | POA: Diagnosis not present

## 2020-04-28 DIAGNOSIS — M47816 Spondylosis without myelopathy or radiculopathy, lumbar region: Secondary | ICD-10-CM

## 2020-04-28 DIAGNOSIS — M170 Bilateral primary osteoarthritis of knee: Secondary | ICD-10-CM

## 2020-04-28 DIAGNOSIS — M160 Bilateral primary osteoarthritis of hip: Secondary | ICD-10-CM | POA: Diagnosis not present

## 2020-04-28 DIAGNOSIS — G8929 Other chronic pain: Secondary | ICD-10-CM

## 2020-04-28 DIAGNOSIS — G9519 Other vascular myelopathies: Secondary | ICD-10-CM

## 2020-04-28 DIAGNOSIS — I255 Ischemic cardiomyopathy: Secondary | ICD-10-CM | POA: Diagnosis not present

## 2020-04-28 DIAGNOSIS — M4807 Spinal stenosis, lumbosacral region: Secondary | ICD-10-CM

## 2020-04-28 MED ORDER — GABAPENTIN 100 MG PO CAPS: 100.0000 mg | ORAL_CAPSULE | Freq: Every day | ORAL | 3 refills | Status: DC

## 2020-04-28 NOTE — Patient Instructions (Signed)
Plan: Knee is suffering from osteoarthritis, only real proven treatments are Weight loss, you should not use NSIADs like motrin, naprosyn. Exercise is helpful. Well padded shoes help. Ice the knee that is suffering from osteoarthritis, only real proven treatments are  Well padded shoes help. Ice the knee 2-3 times a day 15-20 mins at a time.-3 times a day 15-20 mins at a time. Hot showers in the AM.  Injection with steroid may be of benefit. Hemp CBD capsules, amazon.com 5,000-7,000 mg per bottle, 60 capsules per bottle, take one capsule twice a day. Cane in the left hand to use with left leg weight bearing.  Avoid bending, stooping and avoid lifting weights greater than 10 lbs. Avoid prolong standing and walking. Avoid frequent bending and stooping  No lifting greater than 10 lbs. May use ice or moist heat for pain. Weight loss is of benefit. Handicap license is approved. MRI is ordered to assess for recurrent spinal stenosis. Follow-Up Instructions: No follow-ups on file.

## 2020-04-28 NOTE — Addendum Note (Signed)
Addended by: Vira Browns on: 04/28/2020 03:10 PM   Modules accepted: Orders

## 2020-04-28 NOTE — Progress Notes (Signed)
Office Visit Note   Patient: Mark Dougherty           Date of Birth: [birth year]           MRN: [id number] Visit Date: 04/28/2020              Requested by: [name], MD [address]   [place],  [place] [address] PCP: [name], MD   Assessment & Plan: Visit Diagnoses:  1. Chronic low back pain, unspecified back pain laterality, unspecified whether sciatica present   2. Spondylosis without myelopathy or radiculopathy, lumbar region   3. Neurogenic claudication (HCC)   4. Bilateral primary osteoarthritis of hip   5. Bilateral primary osteoarthritis of knee     Plan: Plan: Knee is suffering from osteoarthritis, only real proven treatments are Weight loss, you should not use NSIADs like motrin, naprosyn. Exercise is helpful. Well padded shoes help. Ice the knee that is suffering from osteoarthritis, only real proven treatments are  Well padded shoes help. Ice the knee 2-3 times a day 15-20 mins at a time.-3 times a day 15-20 mins at a time. Hot showers in the AM.  Injection with steroid may be of benefit. Hemp CBD capsules, amazon.com 5,000-7,000 mg per bottle, 60 capsules per bottle, take one capsule twice a day. Cane in the left hand to use with left leg weight bearing.  Avoid bending, stooping and avoid lifting weights greater than 10 lbs. Avoid prolong standing and walking. Avoid frequent bending and stooping  No lifting greater than 10 lbs. May use ice or moist heat for pain. Weight loss is of benefit. Handicap license is approved. MRI is ordered to assess for recurrent spinal stenosis. Follow-Up Instructions: No follow-ups on file.   Follow-Up Instructions: Return in about 4 weeks (around 05/26/2020).   Orders:  Orders Placed This Encounter  Procedures  . XR Lumbar Spine 2-3 Views   No orders of the defined types were placed in this encounter.     Procedures: No procedures performed   Clinical Data: No additional  findings.   Subjective: Chief Complaint  Patient presents with  . Lower Back - Pain    86 year old male with history of L2-3, L4-5 and L5-S1 laminectomies for spinal stenosis in 2008. He is having difficulty with standing and walking and pain also with sitting that is severe. History of CAD, is using a personal mobility chair to get around with for the last 18 months. He is having to take pain meds to relieve pain and a recent change of bed helped with him being able to lie flat again.    Review of Systems  Constitutional: Negative.   HENT: Negative.   Eyes: Negative.   Respiratory: Negative.   Cardiovascular: Negative.   Gastrointestinal: Negative.   Endocrine: Negative.   Genitourinary: Negative.   Musculoskeletal: Negative.   Skin: Negative.   Allergic/Immunologic: Negative.   Neurological: Negative.   Hematological: Negative.   Psychiatric/Behavioral: Negative.      Objective: Vital Signs: BP 128/72 (BP Location: Left Arm, Patient Position: Sitting)   Pulse 80   Ht 5\' 11"  (1.803 m)   Wt 203 lb (92.1 kg)   BMI 28.31 kg/m   Physical Exam Constitutional:      Appearance: He is well-developed and well-nourished.  HENT:     Head: Normocephalic and atraumatic.  Eyes:     Extraocular Movements: EOM normal.     Pupils: Pupils are equal, round, and  reactive to light.  Pulmonary:     Effort: Pulmonary effort is normal.     Breath sounds: Normal breath sounds.  Abdominal:     General: Bowel sounds are normal.     Palpations: Abdomen is soft.  Musculoskeletal:        General: Normal range of motion.     Cervical back: Normal range of motion and neck supple.  Skin:    General: Skin is warm and dry.  Neurological:     Mental Status: He is alert and oriented to person, place, and time.  Psychiatric:        Mood and Affect: Mood and affect normal.        Behavior: Behavior normal.        Thought Content: Thought content normal.        Judgment: Judgment normal.      Back Exam   Tenderness  The patient is experiencing tenderness in the lumbar.  Muscle Strength  Right Quadriceps:  5/5  Left Quadriceps:  5/5  Right Hamstrings:  5/5  Left Hamstrings:  5/5   Reflexes  Patellar: 2/4 Achilles: 2/4 Biceps: 0/4  Other  Toe walk: abnormal Heel walk: abnormal      Specialty Comments:  No specialty comments available.  Imaging: No results found.   PMFS History: Patient Active Problem List   Diagnosis Date Noted  . Knee injury 12/21/2018  . Unilateral primary osteoarthritis, left knee 11/28/2017  . Unilateral primary osteoarthritis, right knee 11/28/2017  . Syncope 11/29/2016  . Acute left-sided low back pain without sciatica 11/12/2016  . Chronic systolic CHF (congestive heart failure) (HCC)   . Ischemic cardiomyopathy   . Thyroid disease   . Typical atrial flutter (HCC)   . Atrial flutter (HCC) 05/31/2015  . Neuropathy 05/31/2015  . PAF (paroxysmal atrial fibrillation) (HCC) 01/04/2011  . Congestive dilated cardiomyopathy (HCC) 09/28/2010  . Dehydration 09/14/2010  . Diabetes mellitus type 2, controlled, with complications (HCC) 01/09/2010  . Hyperlipidemia 01/09/2010  . CAD (coronary artery disease) 01/09/2010  . Carotid stenosis 01/09/2010   Past Medical History:  Diagnosis Date  . A-fib (HCC)    a. in perioperative state-->converted to NSR on amiodarone  . Atrial flutter (HCC)    a. s/p DCCV 06/29/2015  . BPH (benign prostatic hyperplasia)   . CAD (coronary artery disease)    a. s/p CABG in 1996 in [place]  . Carotid artery stenosis    a. s/p left-sided CEA 2000; b. 02/2016 Carotid U/S: <39% bilat dzs; c. 11/2017 Carotid U/S: 1-39% bilat ICA stenosis.  . Chronic combined systolic and diastolic CHF (congestive heart failure) (HCC)    a. echo 2011 with EF 40%; b. 11/2018 Echo: EF 55-60%. Mod conc LVH.  . Diabetes mellitus   . Hyperlipidemia   . Hypotension    mild  . Ischemic cardiomyopathy    a. 2011 Echo: EF 40%; b.  11/2018 Echo: EF 55-60%, mod conc LVH.  Marland Kitchen Left wrist fracture   . Peripheral neuropathy   . Spinal stenosis   . Thyroid disease    a. low TSH and elevated free T4 in June 2015, amiodarone was stopped at that time    Family History  Family history unknown: Yes    Past Surgical History:  Procedure Laterality Date  . BACK SURGERY  1975  . CARDIOVERSION N/A 08/22/2017   Procedure: CARDIOVERSION;  Surgeon: Antonieta Iba, MD;  Location: [facility] ORS;  Service: Cardiovascular;  Laterality: N/A;  . CAROTID  ENDARTERECTOMY     2010  . CORONARY ARTERY BYPASS GRAFT  1996   x3  . ELECTROPHYSIOLOGIC STUDY N/A 06/29/2015   Procedure: Cardioversion;  Surgeon: Antonieta Iba, MD;  Location: [facility] ORS;  Service: Cardiovascular;  Laterality: N/A;  . EYE SURGERY  2013  . REPLACEMENT TOTAL KNEE     left  . TOOTH EXTRACTION     Social History   Occupational History  . Not on file  Tobacco Use  . Smoking status: Never Smoker  . Smokeless tobacco: Former Engineer, water and Sexual Activity  . Alcohol use: No  . Drug use: No  . Sexual activity: Not Currently

## 2020-05-01 DIAGNOSIS — Z7901 Long term (current) use of anticoagulants: Secondary | ICD-10-CM | POA: Diagnosis not present

## 2020-05-01 DIAGNOSIS — Z96652 Presence of left artificial knee joint: Secondary | ICD-10-CM | POA: Insufficient documentation

## 2020-05-01 DIAGNOSIS — Z955 Presence of coronary angioplasty implant and graft: Secondary | ICD-10-CM | POA: Insufficient documentation

## 2020-05-01 DIAGNOSIS — Z79899 Other long term (current) drug therapy: Secondary | ICD-10-CM | POA: Insufficient documentation

## 2020-05-01 DIAGNOSIS — I4891 Unspecified atrial fibrillation: Secondary | ICD-10-CM | POA: Insufficient documentation

## 2020-05-01 DIAGNOSIS — T426X5A Adverse effect of other antiepileptic and sedative-hypnotic drugs, initial encounter: Secondary | ICD-10-CM | POA: Insufficient documentation

## 2020-05-01 DIAGNOSIS — I2510 Atherosclerotic heart disease of native coronary artery without angina pectoris: Secondary | ICD-10-CM | POA: Diagnosis not present

## 2020-05-01 DIAGNOSIS — E119 Type 2 diabetes mellitus without complications: Secondary | ICD-10-CM | POA: Diagnosis not present

## 2020-05-01 DIAGNOSIS — T887XXA Unspecified adverse effect of drug or medicament, initial encounter: Secondary | ICD-10-CM | POA: Insufficient documentation

## 2020-05-01 DIAGNOSIS — R441 Visual hallucinations: Secondary | ICD-10-CM | POA: Insufficient documentation

## 2020-05-01 DIAGNOSIS — Z7984 Long term (current) use of oral hypoglycemic drugs: Secondary | ICD-10-CM | POA: Insufficient documentation

## 2020-05-01 DIAGNOSIS — I5042 Chronic combined systolic (congestive) and diastolic (congestive) heart failure: Secondary | ICD-10-CM | POA: Diagnosis not present

## 2020-05-01 NOTE — ED Triage Notes (Addendum)
BIB [facility] from home due to generalized weakness. Pt was recently prescribed gabapentin by PCP due to chronic back pain. Since taking gabapentin pt has been to too weak to walk on his own and has reported seeing cats in his home per his wife. EMS reports normal VS. Pt alert and oriented X 4 at this time. Pt aware of cat hallucinations and has recall about them. States he now is aware that they were not real-believes it is a medication reaction.

## 2020-05-02 ENCOUNTER — Emergency Department: Payer: Medicare Other

## 2020-05-02 ENCOUNTER — Other Ambulatory Visit: Payer: Self-pay

## 2020-05-02 ENCOUNTER — Emergency Department
Admission: EM | Admit: 2020-05-02 | Discharge: 2020-05-02 | Disposition: A | Discharge status: Home / Self Care | Payer: Medicare Other | Attending: Emergency Medicine | Admitting: Emergency Medicine

## 2020-05-02 DIAGNOSIS — R441 Visual hallucinations: Secondary | ICD-10-CM

## 2020-05-02 DIAGNOSIS — T50905A Adverse effect of unspecified drugs, medicaments and biological substances, initial encounter: Secondary | ICD-10-CM

## 2020-05-02 LAB — CBC
HCT: 28.6 % — ABNORMAL LOW (ref 39.0–52.0)
Hemoglobin: 9.2 g/dL — ABNORMAL LOW (ref 13.0–17.0)
MCH: 31.1 pg (ref 26.0–34.0)
MCHC: 32.2 g/dL (ref 30.0–36.0)
MCV: 96.6 fL (ref 80.0–100.0)
Platelets: 287 10*3/uL (ref 150–400)
RBC: 2.96 MIL/uL — ABNORMAL LOW (ref 4.22–5.81)
RDW: 12.7 % (ref 11.5–15.5)
WBC: 8.1 10*3/uL (ref 4.0–10.5)
nRBC: 0 % (ref 0.0–0.2)

## 2020-05-02 LAB — BASIC METABOLIC PANEL
Anion gap: 6 (ref 5–15)
BUN: 33 mg/dL — ABNORMAL HIGH (ref 8–23)
CO2: 26 mmol/L (ref 22–32)
Calcium: 8.7 mg/dL — ABNORMAL LOW (ref 8.9–10.3)
Chloride: 108 mmol/L (ref 98–111)
Creatinine, Ser: 1.25 mg/dL — ABNORMAL HIGH (ref 0.61–1.24)
GFR, Estimated: 54 mL/min — ABNORMAL LOW (ref >60)
Glucose, Bld: 130 mg/dL — ABNORMAL HIGH (ref 70–99)
Potassium: 4.2 mmol/L (ref 3.5–5.1)
Sodium: 140 mmol/L (ref 135–145)

## 2020-05-02 LAB — URINALYSIS, COMPLETE (UACMP) WITH MICROSCOPIC
Bacteria, UA: NONE SEEN
Bilirubin Urine: NEGATIVE
Glucose, UA: NEGATIVE mg/dL
Hgb urine dipstick: NEGATIVE
Ketones, ur: NEGATIVE mg/dL
Leukocytes,Ua: NEGATIVE
Nitrite: NEGATIVE
Protein, ur: NEGATIVE mg/dL
Specific Gravity, Urine: 1.025 (ref 1.005–1.030)
pH: 5 (ref 5.0–8.0)

## 2020-05-02 MED ORDER — ACETAMINOPHEN 500 MG PO TABS
1000.0000 mg | ORAL_TABLET | Freq: Once | ORAL | Status: AC
  Administered 2020-05-02: 1000 mg via ORAL
  Filled 2020-05-02: qty 2

## 2020-05-02 MED ORDER — ACETAMINOPHEN 500 MG PO TABS: 500.0000 mg | ORAL_TABLET | Freq: Once | ORAL | Status: DC

## 2020-05-02 NOTE — ED Provider Notes (Signed)
[facility] Emergency Department Provider Note ____________________________________________   Event Date/Time   First MD Initiated Contact with Patient 05/02/20 [phone]     (approximate)  I have reviewed the triage vital signs and the nursing notes.  HISTORY  Chief Complaint Weakness   HPI Mark Dougherty is a 87 y.o. malewho presents to the ED for evaluation of visual hallucinations.  Chart review indicates A. fib on Eliquis, DM on Metformin.  HLD.  Chronic lower back pain s/p remote fusions and still follows with neurosurgery.  Patient was just started on gabapentin 4 days ago by his neurosurgeon to try to assist with his chronic lumbar pain.  Patient, and wife, report patient received 2 doses of this medication, and shortly thereafter developed a visual hallucinations.  Patient reports of seeing children and cats running around his home, that he knows her not there.  Denies auditory hallucinations, command hallucinations, suicidality or homicidality.  Denies recent illnesses, fevers, emesis, abdominal pain, chest pain, cough or shortness of breath.  Patient is largely bedbound to these days.  He gets around the house with a Hoveround motorized chair, but otherwise alternates between bed in the recliner. Wife indicates that she is already discontinued the gabapentin for the past 36 hours and she has no additional concerns for falls, infection or other acute illnesses.   Past Medical History:  Diagnosis Date  . A-fib (HCC)    a. in perioperative state-->converted to NSR on amiodarone  . Atrial flutter (HCC)    a. s/p DCCV 06/29/2015  . BPH (benign prostatic hyperplasia)   . CAD (coronary artery disease)    a. s/p CABG in 1996 in [place]  . Carotid artery stenosis    a. s/p left-sided CEA 2000; b. 02/2016 Carotid U/S: <39% bilat dzs; c. 11/2017 Carotid U/S: 1-39% bilat ICA stenosis.  . Chronic combined systolic and diastolic CHF (congestive heart failure) (HCC)     a. echo 2011 with EF 40%; b. 11/2018 Echo: EF 55-60%. Mod conc LVH.  . Diabetes mellitus   . Hyperlipidemia   . Hypotension    mild  . Ischemic cardiomyopathy    a. 2011 Echo: EF 40%; b. 11/2018 Echo: EF 55-60%, mod conc LVH.  Marland Kitchen Left wrist fracture   . Peripheral neuropathy   . Spinal stenosis   . Thyroid disease    a. low TSH and elevated free T4 in June 2015, amiodarone was stopped at that time    Patient Active Problem List   Diagnosis Date Noted  . Knee injury 12/21/2018  . Unilateral primary osteoarthritis, left knee 11/28/2017  . Unilateral primary osteoarthritis, right knee 11/28/2017  . Syncope 11/29/2016  . Acute left-sided low back pain without sciatica 11/12/2016  . Chronic systolic CHF (congestive heart failure) (HCC)   . Ischemic cardiomyopathy   . Thyroid disease   . Typical atrial flutter (HCC)   . Atrial flutter (HCC) 05/31/2015  . Neuropathy 05/31/2015  . PAF (paroxysmal atrial fibrillation) (HCC) 01/04/2011  . Congestive dilated cardiomyopathy (HCC) 09/28/2010  . Dehydration 09/14/2010  . Diabetes mellitus type 2, controlled, with complications (HCC) 01/09/2010  . Hyperlipidemia 01/09/2010  . CAD (coronary artery disease) 01/09/2010  . Carotid stenosis 01/09/2010    Past Surgical History:  Procedure Laterality Date  . BACK SURGERY  1975  . CARDIOVERSION N/A 08/22/2017   Procedure: CARDIOVERSION;  Surgeon: Antonieta Iba, MD;  Location: [facility] ORS;  Service: Cardiovascular;  Laterality: N/A;  . CAROTID ENDARTERECTOMY  2010  . CORONARY ARTERY BYPASS GRAFT  1996   x3  . ELECTROPHYSIOLOGIC STUDY N/A 06/29/2015   Procedure: Cardioversion;  Surgeon: Antonieta Iba, MD;  Location: [facility] ORS;  Service: Cardiovascular;  Laterality: N/A;  . EYE SURGERY  2013  . REPLACEMENT TOTAL KNEE     left  . TOOTH EXTRACTION      Prior to Admission medications   Medication Sig Start Date End Date Taking? Authorizing Provider  ELIQUIS 5 MG TABS tablet TAKE 1 TABLET BY  MOUTH TWICE DAILY Patient taking differently: Take 5 mg by mouth 2 (two) times daily.  01/16/19   Dunn, Raymon Mutton, PA-C  finasteride (PROSCAR) 5 MG tablet Take 5 mg by mouth daily.    [provider]  folic acid (FOLVITE) 1 MG tablet Take 1 mg by mouth daily.      [provider]  gabapentin (NEURONTIN) 100 MG capsule Take 1 capsule (100 mg total) by mouth at bedtime. 04/28/20   Kerrin Champagne, MD  HYDROcodone-acetaminophen (NORCO/VICODIN) 5-325 MG tablet Take 1 tablet by mouth every 6 (six) hours as needed for moderate pain. 03/14/20   Kathryne Hitch, MD  metFORMIN (GLUCOPHAGE) 500 MG tablet Take 500 mg by mouth 2 (two) times daily with a meal.      [provider]  metoprolol tartrate (LOPRESSOR) 25 MG tablet Take 1 tablet (25 mg total) by mouth 2 (two) times daily. 02/01/20   Antonieta Iba, MD  Multiple Vitamins-Minerals (CENTRUM PO) Take 1 tablet by mouth daily.    [provider]  simvastatin (ZOCOR) 20 MG tablet TAKE ONE TABLET BY MOUTH EVERY NIGHT AT BEDTIME Patient taking differently: TAKE 20 MG BY MOUTH EVERY NIGHT AT BEDTIME 02/04/17   Antonieta Iba, MD  terazosin (HYTRIN) 1 MG capsule Take 1 mg by mouth at bedtime.    [provider]    Allergies Patient has no known allergies.  Family History  Family history unknown: Yes    Social History Social History   Tobacco Use  . Smoking status: Never Smoker  . Smokeless tobacco: Former Engineer, water Use Topics  . Alcohol use: No  . Drug use: No    Review of Systems  Constitutional: No fever/chills Eyes: No visual changes. ENT: No sore throat. Cardiovascular: Denies chest pain. Respiratory: Denies shortness of breath. Gastrointestinal: No abdominal pain.  No nausea, no vomiting.  No diarrhea.  No constipation. Genitourinary: Negative for dysuria. Musculoskeletal: Negative for back pain. Skin: Negative for rash. Neurological: Negative for headaches, focal weakness  or numbness. Psychiatric: Positive for visual hallucinations  ____________________________________________   PHYSICAL EXAM:  VITAL SIGNS: Vitals:   05/02/20 0316 05/02/20 0611  BP: (!) 157/112 (!) 147/89  Pulse: 67 68  Resp: 18 17  Temp:  97.6 F (36.4 C)  SpO2: 97% 98%     Constitutional: Alert and oriented. Well appearing and in no acute distress.  Hard of hearing, but pleasant and conversational in full sentences.  Quite funny and makes jokes.  Seems to have good insight into his hallucinations and chronic conditions.  Indicates he knows that they are not real. Eyes: Conjunctivae are normal. PERRL. EOMI. Head: Atraumatic. Nose: No congestion/rhinnorhea. Mouth/Throat: Mucous membranes are moist.  Oropharynx non-erythematous. Neck: No stridor. No cervical spine tenderness to palpation. Cardiovascular: Normal rate, regular rhythm. Grossly normal heart sounds.  Good peripheral circulation. Respiratory: Normal respiratory effort.  No retractions. Lungs CTAB. Gastrointestinal: Soft , nondistended, nontender to palpation. No CVA tenderness.  Musculoskeletal: No lower extremity tenderness nor edema.  No joint effusions. No signs of acute trauma. Neurologic:  Normal speech and language. No gross focal neurologic deficits are appreciated. Cranial nerves II through XII intact Skin:  Skin is warm, dry and intact. No rash noted. Psychiatric: Mood and affect are normal. Speech and behavior are normal.  Linear thought processes.  ____________________________________________   LABS (all labs ordered are listed, but only abnormal results are displayed)  Labs Reviewed  BASIC METABOLIC PANEL - Abnormal; Notable for the following components:      Result Value   Glucose, Bld 130 (*)    BUN 33 (*)    Creatinine, Ser 1.25 (*)    Calcium 8.7 (*)    GFR, Estimated 54 (*)    All other components within normal limits  CBC - Abnormal; Notable for the following components:   RBC 2.96 (*)     Hemoglobin 9.2 (*)    HCT 28.6 (*)    All other components within normal limits  URINALYSIS, COMPLETE (UACMP) WITH MICROSCOPIC   ____________________________________________  12 Lead EKG  Sinus rhythm, rate of 73 bpm.  Leftward axis.  Normal intervals.  T wave inversions to aVL at baseline.  No evidence of acute ischemia. ____________________________________________  RADIOLOGY  ED MD interpretation: CT head reviewed by me without evidence of acute intracranial pathology.  Official radiology report(s): CT Head Wo Contrast  Result Date: 05/02/2020 CLINICAL DATA:  Mental status change with new hallucination EXAM: CT HEAD WITHOUT CONTRAST TECHNIQUE: Contiguous axial images were obtained from the base of the skull through the vertex without intravenous contrast. COMPARISON:  10/06/2019 FINDINGS: Brain: No evidence of acute infarction, hemorrhage, hydrocephalus, extra-axial collection or mass lesion/mass effect. Generalized volume loss. Vascular: No hyperdense vessel or unexpected calcification. Skull: Normal. Negative for fracture or focal lesion. Sinuses/Orbits: Bilateral cataract resection. Chronic partially calcified debris in the left sphenoid sinus. Other: Mild motion artifact. IMPRESSION: No acute or interval finding. Electronically Signed   By: Monte Fantasia M.D.   On: 05/02/2020 06:51    ____________________________________________   PROCEDURES and INTERVENTIONS  Procedure(s) performed (including Critical Care):  .1-3 Lead EKG Interpretation Performed by: Vladimir Crofts, MD Authorized by: Vladimir Crofts, MD     Interpretation: normal     ECG rate:  70   ECG rate assessment: normal     Rhythm: sinus rhythm     Ectopy: none     Conduction: normal      Medications  acetaminophen (TYLENOL) tablet 1,000 mg (1,000 mg Oral Given 05/02/20 0110)    ____________________________________________   MDM / ED COURSE   86 year old male presents from home with 2 days of visual  hallucinations, likely due to medication side effect in the setting of recently initiated gabapentin, and amenable to discharge home to the care of his wife.  Normal vitals on room air.  Exam without evidence of acute derangements.  He reports he still occasionally sees children or cats running around, but he has no evidence of neurovascular deficits or acute pathology beyond this.  He has good insight his condition.  Blood work shows CKD at baseline without evidence of acute derangements.  CT head shows no ICH, mass or central pathology to cause his donations.  No evidence of psychiatric pathology to necessitate psychiatric consultation at this time.  We discussed need for urinalysis, despite his lack of symptoms, to ensure no UTI causing metabolic encephalopathy.  Patient signed out to oncoming provider to facilitate following up on this  study, anticipate outpatient management regardless.  Patient's wife helps care for him and indicates that she wants him back home regardless of this test, plus or minus antibiotics.   Clinical Course as of 05/02/20 0735  Mon May 02, 2020  1638 Reassessed.  Patient continues to look well.  Has no complaints.  Wife now the bedside indicates that she has discontinued the gabapentin for the past 36 hours due to her high suspicion that discussed caused his hallucinations.  We discussed benign work-up otherwise with basic labs at his baseline, CT head without ICH or mass.  We discussed need for urine testing to ensure no UTI as a source of a metabolic encephalopathy.  She is agreeable.  She reports that she is happy to take him home, and requests discharge ultimately. [DS]    Clinical Course User Index [DS] Delton Prairie, MD    ____________________________________________   FINAL CLINICAL IMPRESSION(S) / ED DIAGNOSES  Final diagnoses:  Visual hallucination  Medication side effect, initial encounter     ED Discharge Orders    None       Demisha Nokes Katrinka Blazing   Note:   This document was prepared using Dragon voice recognition software and may include unintentional dictation errors.   Delton Prairie, MD 05/02/20 [phone]

## 2020-05-02 NOTE — Discharge Instructions (Addendum)
As we discussed, please discontinue his gabapentin altogether.  Laduca hallucinations are likely a reaction/side effect to this medication.  Use Tylenol for pain and fevers.  Up to 1000 mg per dose, up to 4 times per day.  Do not take more than 4000 mg of Tylenol/acetaminophen within 24 hours..  Follow-up with your neurosurgeon as scheduled, and I would recommend that you get the MRI later this week as regularly scheduled.  Return to the ED with any hallucinations that do not improve, fevers or other acute illnesses.

## 2020-05-05 ENCOUNTER — Telehealth: Payer: Self-pay | Admitting: Cardiovascular Disease

## 2020-05-05 NOTE — Telephone Encounter (Signed)
Patient came by office  Dropped off [employer] form to be completed and mailed back to patient  Placed in nurse box

## 2020-05-06 NOTE — Telephone Encounter (Signed)
Mr. Dan Humphreys dropped off assistance form, placed on Dr. Mariah Milling desk to be signed. Will fax once completed.

## 2020-05-10 ENCOUNTER — Telehealth: Payer: Self-pay

## 2020-05-10 NOTE — Telephone Encounter (Signed)
Dr. Mariah Milling signed pt's assistance application for Eliquis, will mail back to pt with the given already addressed envelope sent with form. Cannot fax d/t not having the completed form, pt has the rest of the application. Will make a copy to keep for reference.

## 2020-05-23 ENCOUNTER — Telehealth: Payer: Self-pay | Admitting: Cardiovascular Disease

## 2020-05-23 NOTE — Telephone Encounter (Signed)
Pt c/o medication issue:  1. Name of Medication: metoprolol tartrate  2. How are you currently taking this medication (dosage and times per day)? 25 mg bid  3. Are you having a reaction (difficulty breathing--STAT)?   4. What is your medication issue? Patients wife states he is seeing things (Dr. Arlana Pouch put him on Divalproex 500 mg for this reason) Tiredness, not being able to concentrate, loss of interest

## 2020-05-23 NOTE — Telephone Encounter (Signed)
Was able to return pt's wife phone call for her concern of what possible could be causing pt's increase tiredness, loss of interest, unable to concentrate, and seeing things. Wife stated she was trying to review all pt's medications to see of maybe one is causing the difference in pt, wife stated she did not think the metoprolol tartrate was causing it since pt has been on medication for some time, but was "taking a shot in the dark". Pt was seen in the ED and evaluated for visual hallucinations on 1/3/2022thinking it may be his gabapentin, head CT order with no acute or interval findings. Wife reports "I know it is his age and health getting worse, but I was trying to make him feel better with calling, he thinks the problem is his eyes". Wife reports no CP or BP issues, mailing just the "seeing things". Advised wife to reach out to PCP for further evaluation and to reach back out to Dr. Mariah Milling for any heart or BP issues. Joadie verbalized understanding, will call back for anything further, grateful for the callback.

## 2020-06-03 NOTE — Telephone Encounter (Signed)
Patient was approved for Eliquis free of charge 06/01/2020 through 04/29/2021.

## 2020-08-01 ENCOUNTER — Telehealth: Payer: Self-pay | Admitting: Cardiovascular Disease

## 2020-08-01 ENCOUNTER — Other Ambulatory Visit
Admission: RE | Admit: 2020-08-01 | Discharge: 2020-08-01 | Disposition: A | Discharge status: Home / Self Care | Payer: Medicare Other | Source: Ambulatory Visit | Attending: Cardiovascular Disease | Admitting: Cardiovascular Disease

## 2020-08-01 DIAGNOSIS — Z79899 Other long term (current) drug therapy: Secondary | ICD-10-CM

## 2020-08-01 DIAGNOSIS — D649 Anemia, unspecified: Secondary | ICD-10-CM

## 2020-08-01 DIAGNOSIS — K921 Melena: Secondary | ICD-10-CM | POA: Insufficient documentation

## 2020-08-01 LAB — CBC
HCT: 32 % — ABNORMAL LOW (ref 39.0–52.0)
Hemoglobin: 10.1 g/dL — ABNORMAL LOW (ref 13.0–17.0)
MCH: 30.9 pg (ref 26.0–34.0)
MCHC: 31.6 g/dL (ref 30.0–36.0)
MCV: 97.9 fL (ref 80.0–100.0)
Platelets: 273 10*3/uL (ref 150–400)
RBC: 3.27 MIL/uL — ABNORMAL LOW (ref 4.22–5.81)
RDW: 12.9 % (ref 11.5–15.5)
WBC: 7.4 10*3/uL (ref 4.0–10.5)
nRBC: 0 % (ref 0.0–0.2)

## 2020-08-01 NOTE — Telephone Encounter (Signed)
Was able to reach back out to Mr. Coberly, he reports intermittent black, tary, stool with blood intermittent for 3 weeks. Dr. Arlana Pouch advised to hold Eliquis for several days, symptoms then resolved after being off Eliquis for 4-5 days, then Dr. Arlana Pouch advised to restart Eliquis. Pt reports yesterday noted the blood back in stool and very dark in color. Dr. Arlana Pouch advised to call Dr. Mariah Milling for advice, reprots low Hgb. Per chart review last HgB in Jan 2022, was 9.2 Hgb, pt reports no repeat labs since Jan.   Dr. Mariah Milling advice to hold Eliquis until further notice based on symptoms and cardiac chart review. Pt will stop Eliquis for now, possible indefinitely given age and history. Order for CBC placed to check HgB. Pt understands to [facility] for labs, wife will drive. Pt unsure of any weakness or fatigue r/u blood in stool. Report is chair bound and sleeps 12 hrs a day. Pt seems in good spirits during phone conversations, reports no cardiac related symptoms. Otherwise all questions or concerns were address and no additional concerns at this time. Agreeable to plan, will call back for anything further.

## 2020-08-01 NOTE — Telephone Encounter (Signed)
Pt c/o medication issue:  1. Name of Medication: Eliquis  2. How are you currently taking this medication (dosage and times per day)? 5 mg in am  3. Are you having a reaction (difficulty breathing--STAT)? Na   4. What is your medication issue? Patient is having dark bloody stools for last 3 weeks. Patient saw PCP which advised patient to call Dr. Mariah Milling to discuss eliquis. Hemoglobin is low per PCP

## 2020-08-03 ENCOUNTER — Telehealth: Payer: Self-pay | Admitting: Cardiovascular Disease

## 2020-08-03 NOTE — Telephone Encounter (Signed)
Patient's PCP would like to know what labs were ordered this week . States patient has a lab appointment with their office on 4/8. Please call back after 2 pm

## 2020-08-03 NOTE — Telephone Encounter (Signed)
Patient called back states he meant to mention he had blood in his stool. Please call to discuss.

## 2020-08-04 ENCOUNTER — Telehealth: Payer: Self-pay | Admitting: Cardiovascular Disease

## 2020-08-04 NOTE — Telephone Encounter (Signed)
Patient had labs on 4/4 and is waiting on lab results  Please advise

## 2020-08-04 NOTE — Telephone Encounter (Signed)
Spoke with pt's and his wife regarding his recent CBC results, Dr. Mariah Milling advised "Blood count stable over the past year, higher from January"  Wife and pt gratful for the return call, advised will fax results to Dr. Arlana Pouch as well, also suggested Dr. Arlana Pouch place a referral for GI consult given the intermittent blood in stool x 3weeks. They verbalized understanding, also pt remains off Eluqis at this time. Both are grateful for the return call, nothing further at this time.

## 2020-08-04 NOTE — Telephone Encounter (Signed)
Faxed over pt's recent CBC results from 08/01/20 to Dr. Arlana Pouch for review as requested by pt and Dr. Maree Krabbe office.

## 2020-08-15 NOTE — Progress Notes (Signed)
Evaluation Performed:  Follow-up visit  Date:  08/16/2020   ID:  Mark Dougherty, DOB 1927-04-07, MRN [id number]  Patient Location:  [address] [place] [address]   Provider location:   [facility], [place] office  PCP:  [name], MD  Cardiologist:  Hubbard Robinson [facility]   Chief Complaint  Patient presents with  . Follow-up    6 months  Pt states he has been feeling pretty good, "seem to be back running normal now"    History of Present Illness:    Mark Dougherty is a 87 y.o. male  past medical history of coronary artery disease,  bypass surgery in 1996 in [place],  PAD with carotid stenosis, CEA on the left in 2000 on the left,  diabetes,  severe chronic back pain with MRI showing spinal stenosis,  hyperlipidemia,  back surgery 4 to 5 years ago with complications including atrial fibrillation in the perioperative, postoperative period started on amiodarone with conversion to sinus rhythm, with a repeat admission to [facility] for dehydration, hypotension and malaise.  hospital admission February 2016 for failure to thrive, dehydration. Previous EKG showing atrial flutter, underwent cardioversion 3/2017which was successful in restoring normal sinus rhythm echocardiogram 2011 showing ejection fraction 40% EF 55 to 60% in 11/2018 Chronic shortness of breath, back pain, debility He presents for routine followup of his coronary artery disease and syncope  LOV 01/2020 Discussed recent events, wife present on today's visit 3 weeks ago, diarrhea, lasted 3 days Some bleed Seen by Dr. Eber Jones eliquis for 2 weeks  Weight down 6 pounds  Lots of sleeping, otherwise feels well currently  Off metoprolol tartrate by accident Thought he was taking it, 25 twice daily  Denies any tachypalpitations concerning for atrial fibrillation  Limited inability to ambulate secondary to joint pain, back pain Presenting in a wheelchair  EKG  personally reviewed by myself on todays visit Shows normal sinus rhythm with rate 67 bpm poor R wave progression through the anterior precordial leads, left axis deviation   emergency room October 2020 for weakness, was given IV fluids and discharged home  Seen in the emergency room January 26, 2020 for SVT Was minimally responsive in wheelchair Noted to be in SVT, systolic pressure in the 80s, given adenosine converted to sinus tachycardia After converting he woke up was more responsive Troponin 300 Started on low-dose metoprolol twice daily  He has had a history of acutely having a blank stare,, " going out of it" per the wife that seems to resolve after minute In hindsight she wonders if it could have been from a cardiac arrhythmia as detailed above  Cardioversion 07/2017,  Denies arrhythmia  Episode of syncope July  2018 UTI Carotid ultrasound less than 49% proximal right carotid disease, no significant stenosis on the left, Vertebral arteries patent Echocardiogram ejection fraction 55-60%, mild aortic valve stenosis Recent event monitor with one run of nonsustained VT 9 beats, no symptoms   Prior CV studies:   The following studies were reviewed today:  Echo 11/2016 - Left ventricle: The cavity size was normal. Systolic function was   normal. The estimated ejection fraction was in the range of 55%   to 60%. Wall motion was normal; there were no regional wall   motion abnormalities. Doppler parameters are consistent with   abnormal left ventricular relaxation (grade 1 diastolic   dysfunction). - Aortic valve: severely calcified leaflets. There was mild to  moderate stenosis by estimated AVA. Stenosis is borderline mild   by mean gradient and peak velocity. Valve area (VTI): 1.32 cm^2. - Left atrium: The atrium was mildly dilated. - Right ventricle: Systolic function was normal. - Pulmonary arteries: Systolic pressure was within the normal   range.  Past Medical  History:  Diagnosis Date  . A-fib (HCC)    a. in perioperative state-->converted to NSR on amiodarone  . Atrial flutter (HCC)    a. s/p DCCV 06/29/2015  . BPH (benign prostatic hyperplasia)   . CAD (coronary artery disease)    a. s/p CABG in 1996 in [place]  . Carotid artery stenosis    a. s/p left-sided CEA 2000; b. 02/2016 Carotid U/S: <39% bilat dzs; c. 11/2017 Carotid U/S: 1-39% bilat ICA stenosis.  . Chronic combined systolic and diastolic CHF (congestive heart failure) (HCC)    a. echo 2011 with EF 40%; b. 11/2018 Echo: EF 55-60%. Mod conc LVH.  . Diabetes mellitus   . Hyperlipidemia   . Hypotension    mild  . Ischemic cardiomyopathy    a. 2011 Echo: EF 40%; b. 11/2018 Echo: EF 55-60%, mod conc LVH.  Marland Kitchen Left wrist fracture   . Peripheral neuropathy   . Spinal stenosis   . Thyroid disease    a. low TSH and elevated free T4 in June 2015, amiodarone was stopped at that time   Past Surgical History:  Procedure Laterality Date  . BACK SURGERY  1975  . CARDIOVERSION N/A 08/22/2017   Procedure: CARDIOVERSION;  Surgeon: Antonieta Iba, MD;  Location: [facility] ORS;  Service: Cardiovascular;  Laterality: N/A;  . CAROTID ENDARTERECTOMY     2010  . CORONARY ARTERY BYPASS GRAFT  1996   x3  . ELECTROPHYSIOLOGIC STUDY N/A 06/29/2015   Procedure: Cardioversion;  Surgeon: Antonieta Iba, MD;  Location: [facility] ORS;  Service: Cardiovascular;  Laterality: N/A;  . EYE SURGERY  2013  . REPLACEMENT TOTAL KNEE     left  . TOOTH EXTRACTION       Current Meds  Medication Sig  . finasteride (PROSCAR) 5 MG tablet Take 5 mg by mouth daily.  . folic acid (FOLVITE) 1 MG tablet Take 1 mg by mouth daily.    Marland Kitchen HYDROcodone-acetaminophen (NORCO/VICODIN) 5-325 MG tablet Take 1 tablet by mouth every 6 (six) hours as needed for moderate pain.  . metFORMIN (GLUCOPHAGE) 500 MG tablet Take 500 mg by mouth 2 (two) times daily with a meal.    . Multiple Vitamins-Minerals (CENTRUM PO) Take 1 tablet by mouth daily.  .  simvastatin (ZOCOR) 20 MG tablet Take 20 mg by mouth at bedtime.  Marland Kitchen terazosin (HYTRIN) 1 MG capsule Take 1 mg by mouth at bedtime.     Allergies:   Patient has no known allergies.   Social History   Tobacco Use  . Smoking status: Never Smoker  . Smokeless tobacco: Former Engineer, water Use Topics  . Alcohol use: No  . Drug use: No     Family Hx: The patient's Family history is unknown by patient.  ROS:   Please see the history of present illness.    Review of Systems  Constitutional: Negative.   HENT: Negative.   Respiratory: Positive for shortness of breath.   Cardiovascular: Negative.   Gastrointestinal: Negative.   Musculoskeletal: Positive for back pain.       Unsteady gait  Neurological: Negative.   Psychiatric/Behavioral: Negative.   All other systems reviewed and are negative.  Labs/Other Tests and Data Reviewed:    Recent Labs: 01/26/2020: Magnesium 1.8 05/02/2020: BUN 33; Creatinine, Ser 1.25; Potassium 4.2; Sodium 140 08/01/2020: Hemoglobin 10.1; Platelets 273   Recent Lipid Panel No results found for: CHOL, TRIG, HDL, CHOLHDL, LDLCALC, LDLDIRECT  Wt Readings from Last 3 Encounters:  08/16/20 197 lb 6.4 oz (89.5 kg)  05/02/20 202 lb 13.2 oz (92 kg)  04/28/20 203 lb (92.1 kg)     Exam:    Vital Signs:  BP 106/62   Pulse 67   Ht 5\' 11"  (1.803 m)   Wt 197 lb 6.4 oz (89.5 kg)   BMI 27.53 kg/m   Constitutional:  oriented to person, place, and time. No distress.  HENT:  Head: Grossly normal Eyes:  no discharge. No scleral icterus.  Neck: No JVD, no carotid bruits  Cardiovascular: Regular rate and rhythm, no murmurs appreciated Pulmonary/Chest: Clear to auscultation bilaterally, no wheezes or rails Abdominal: Soft.  no distension.  no tenderness.  Musculoskeletal: Normal range of motion Neurological:  normal muscle tone. Coordination normal. No atrophy Skin: Skin warm and dry Psychiatric: normal affect, pleasant  ASSESSMENT & PLAN:     Coronary artery disease of native artery of native heart with stable angina pectoris (HCC) -  Previous troponin elevation in the setting of SVT, rapid rate Currently with no symptoms of angina. No further workup at this time. Continue current medication regimen.  Paroxysmal atrial fibrillation (HCC) - EKG maintaining normal sinus rhythm Recent gastroenteritis it would appear from his presentation, 3 days of profound diarrhea now resolved Stopped Eliquis 2 weeks ago, Recommend he check blood work with Dr. tomorrow, if hemoglobin 10 or greater, would restart Eliquis 5 twice daily with close monitoring of his stools for any discoloration ,  melena 6 pound weight loss, Blood pressure running low we will restart metoprolol tartrate which he was not taking at 12.5 twice daily  SVT Prior symptomatic episode, broke with adenosine Low-dose metoprolol as above For any recurrent episodes, may need adenosine, carotid sinus massage Could even consider amiodarone given his low blood pressure  Ischemic cardiomyopathy - Ejection fraction normalized to 55% Denies any anginal symptoms  Chronic systolic CHF (congestive heart failure) (HCC) - Has lab work pending with primary care Judicious use of diuretic  Bilateral carotid artery stenosis -  Prior ultrasound August 2019 Less than 39% bilaterally  Controlled type 2 diabetes mellitus with complication, without long-term current use of insulin (HCC) - Recent 6 pound weight loss Denies missing meals, had recent diarrhea  Mixed hyperlipidemia - Cholesterol at goal, no medication changes made  Typical atrial flutter (HCC) - Prior cardioversion x2 in 2017 and 2019 Restart Eliquis only as above Maintaining normal sinus rhythm   Total encounter time more than 35 minutes  Greater than 50% was spent in counseling and coordination of care with the patient    Signed, 2020, MD  08/16/2020 3:32 PM    [facility]  [facility] [address], [place], [place]

## 2020-08-16 ENCOUNTER — Encounter: Payer: Self-pay | Admitting: Cardiovascular Disease

## 2020-08-16 ENCOUNTER — Ambulatory Visit (INDEPENDENT_AMBULATORY_CARE_PROVIDER_SITE_OTHER): Payer: Medicare Other | Admitting: Cardiovascular Disease

## 2020-08-16 ENCOUNTER — Other Ambulatory Visit: Payer: Self-pay

## 2020-08-16 VITALS — BP 106/62 | HR 67 | Ht 71.0 in | Wt 197.4 lb

## 2020-08-16 DIAGNOSIS — I480 Paroxysmal atrial fibrillation: Secondary | ICD-10-CM | POA: Diagnosis not present

## 2020-08-16 DIAGNOSIS — I255 Ischemic cardiomyopathy: Secondary | ICD-10-CM | POA: Diagnosis not present

## 2020-08-16 DIAGNOSIS — I5022 Chronic systolic (congestive) heart failure: Secondary | ICD-10-CM

## 2020-08-16 DIAGNOSIS — I6523 Occlusion and stenosis of bilateral carotid arteries: Secondary | ICD-10-CM

## 2020-08-16 DIAGNOSIS — I25118 Atherosclerotic heart disease of native coronary artery with other forms of angina pectoris: Secondary | ICD-10-CM

## 2020-08-16 DIAGNOSIS — E782 Mixed hyperlipidemia: Secondary | ICD-10-CM

## 2020-08-16 DIAGNOSIS — I350 Nonrheumatic aortic (valve) stenosis: Secondary | ICD-10-CM

## 2020-08-16 DIAGNOSIS — E118 Type 2 diabetes mellitus with unspecified complications: Secondary | ICD-10-CM

## 2020-08-16 MED ORDER — METOPROLOL TARTRATE 25 MG PO TABS: 12.5000 mg | ORAL_TABLET | Freq: Two times a day (BID) | ORAL | 3 refills | Status: DC

## 2020-08-16 NOTE — Telephone Encounter (Signed)
-----   Message from Maple Hudson, RN sent at 08/16/2020  3:59 PM EDT ----- Regarding: Eliquis FYI Pt is restarting Eliquis 5 mg BID, tomorrow after PCP appt, after being off for 2 weeks for GI bleed.  Thanks [employer]

## 2020-08-16 NOTE — Telephone Encounter (Signed)
Noted  

## 2020-08-16 NOTE — Patient Instructions (Addendum)
Medication Instructions:   Please restart metoprolol tartrate 12.5 mg twice a day   Restart eliqiuis 5 twice a day after speaking with Dr. Arlana Pouch, after review of lab work  If you need a refill on your cardiac medications before your next appointment, please call your pharmacy.   Lab work: No new labs needed  Testing/Procedures: No new testing needed  Follow-Up:  . You will need a follow up appointment in 6 months  . Providers on your designated Care Team:   . Nicolasa Ducking, NP . Eula Listen, PA-C . Marisue Ivan, PA-C  COVID-19 Vaccine Information can be found at: [email or web address] For questions related to vaccine distribution or appointments, please email [email or web address] or call [phone].

## 2020-10-31 ENCOUNTER — Observation Stay: Payer: Medicare Other

## 2020-10-31 ENCOUNTER — Emergency Department: Payer: Medicare Other

## 2020-10-31 ENCOUNTER — Inpatient Hospital Stay
Admission: EM | Admit: 2020-10-31 | Discharge: 2020-11-03 | DRG: 948 | Disposition: A | Discharge status: Skilled Nursing Facility | Payer: Medicare Other | Attending: Internal Medicine | Admitting: Internal Medicine

## 2020-10-31 DIAGNOSIS — Z20822 Contact with and (suspected) exposure to covid-19: Secondary | ICD-10-CM | POA: Diagnosis present

## 2020-10-31 DIAGNOSIS — Z7984 Long term (current) use of oral hypoglycemic drugs: Secondary | ICD-10-CM

## 2020-10-31 DIAGNOSIS — R441 Visual hallucinations: Secondary | ICD-10-CM | POA: Diagnosis not present

## 2020-10-31 DIAGNOSIS — R5381 Other malaise: Principal | ICD-10-CM | POA: Diagnosis present

## 2020-10-31 DIAGNOSIS — E782 Mixed hyperlipidemia: Secondary | ICD-10-CM

## 2020-10-31 DIAGNOSIS — Z79899 Other long term (current) drug therapy: Secondary | ICD-10-CM

## 2020-10-31 DIAGNOSIS — W19XXXA Unspecified fall, initial encounter: Secondary | ICD-10-CM | POA: Diagnosis present

## 2020-10-31 DIAGNOSIS — Z9181 History of falling: Secondary | ICD-10-CM

## 2020-10-31 DIAGNOSIS — I255 Ischemic cardiomyopathy: Secondary | ICD-10-CM | POA: Diagnosis present

## 2020-10-31 DIAGNOSIS — Z96652 Presence of left artificial knee joint: Secondary | ICD-10-CM | POA: Diagnosis present

## 2020-10-31 DIAGNOSIS — R634 Abnormal weight loss: Secondary | ICD-10-CM | POA: Diagnosis present

## 2020-10-31 DIAGNOSIS — R4182 Altered mental status, unspecified: Secondary | ICD-10-CM

## 2020-10-31 DIAGNOSIS — Y92009 Unspecified place in unspecified non-institutional (private) residence as the place of occurrence of the external cause: Secondary | ICD-10-CM

## 2020-10-31 DIAGNOSIS — I130 Hypertensive heart and chronic kidney disease with heart failure and stage 1 through stage 4 chronic kidney disease, or unspecified chronic kidney disease: Secondary | ICD-10-CM | POA: Diagnosis present

## 2020-10-31 DIAGNOSIS — R296 Repeated falls: Secondary | ICD-10-CM

## 2020-10-31 DIAGNOSIS — I2510 Atherosclerotic heart disease of native coronary artery without angina pectoris: Secondary | ICD-10-CM | POA: Diagnosis not present

## 2020-10-31 DIAGNOSIS — Z6827 Body mass index (BMI) 27.0-27.9, adult: Secondary | ICD-10-CM

## 2020-10-31 DIAGNOSIS — I5042 Chronic combined systolic (congestive) and diastolic (congestive) heart failure: Secondary | ICD-10-CM | POA: Diagnosis present

## 2020-10-31 DIAGNOSIS — E118 Type 2 diabetes mellitus with unspecified complications: Secondary | ICD-10-CM

## 2020-10-31 DIAGNOSIS — E1142 Type 2 diabetes mellitus with diabetic polyneuropathy: Secondary | ICD-10-CM | POA: Diagnosis present

## 2020-10-31 DIAGNOSIS — M25562 Pain in left knee: Secondary | ICD-10-CM

## 2020-10-31 DIAGNOSIS — E785 Hyperlipidemia, unspecified: Secondary | ICD-10-CM | POA: Diagnosis present

## 2020-10-31 DIAGNOSIS — Z7901 Long term (current) use of anticoagulants: Secondary | ICD-10-CM

## 2020-10-31 DIAGNOSIS — Z66 Do not resuscitate: Secondary | ICD-10-CM | POA: Diagnosis present

## 2020-10-31 DIAGNOSIS — N1831 Chronic kidney disease, stage 3a: Secondary | ICD-10-CM | POA: Diagnosis present

## 2020-10-31 DIAGNOSIS — E1122 Type 2 diabetes mellitus with diabetic chronic kidney disease: Secondary | ICD-10-CM | POA: Diagnosis present

## 2020-10-31 DIAGNOSIS — G8929 Other chronic pain: Secondary | ICD-10-CM | POA: Diagnosis present

## 2020-10-31 DIAGNOSIS — Z87891 Personal history of nicotine dependence: Secondary | ICD-10-CM

## 2020-10-31 DIAGNOSIS — M25561 Pain in right knee: Secondary | ICD-10-CM | POA: Diagnosis present

## 2020-10-31 DIAGNOSIS — F0390 Unspecified dementia without behavioral disturbance: Secondary | ICD-10-CM | POA: Diagnosis present

## 2020-10-31 DIAGNOSIS — I480 Paroxysmal atrial fibrillation: Secondary | ICD-10-CM | POA: Diagnosis present

## 2020-10-31 DIAGNOSIS — Z951 Presence of aortocoronary bypass graft: Secondary | ICD-10-CM

## 2020-10-31 DIAGNOSIS — I4892 Unspecified atrial flutter: Secondary | ICD-10-CM | POA: Diagnosis present

## 2020-10-31 DIAGNOSIS — N400 Enlarged prostate without lower urinary tract symptoms: Secondary | ICD-10-CM | POA: Diagnosis present

## 2020-10-31 LAB — URINALYSIS, COMPLETE (UACMP) WITH MICROSCOPIC
Bacteria, UA: NONE SEEN
Bilirubin Urine: NEGATIVE
Glucose, UA: NEGATIVE mg/dL
Hgb urine dipstick: NEGATIVE
Ketones, ur: 5 mg/dL — AB
Leukocytes,Ua: NEGATIVE
Nitrite: NEGATIVE
Protein, ur: NEGATIVE mg/dL
Specific Gravity, Urine: 1.02 (ref 1.005–1.030)
Squamous Epithelial / HPF: NONE SEEN (ref 0–5)
pH: 5 (ref 5.0–8.0)

## 2020-10-31 LAB — AMMONIA: Ammonia: 11 umol/L (ref 9–35)

## 2020-10-31 LAB — RESP PANEL BY RT-PCR (FLU A&B, COVID) ARPGX2
Influenza A by PCR: NEGATIVE
Influenza B by PCR: NEGATIVE
SARS Coronavirus 2 by RT PCR: NEGATIVE

## 2020-10-31 LAB — CBC WITH DIFFERENTIAL/PLATELET
Abs Immature Granulocytes: 0.05 10*3/uL (ref 0.00–0.07)
Basophils Absolute: 0 10*3/uL (ref 0.0–0.1)
Basophils Relative: 0 %
Eosinophils Absolute: 0.1 10*3/uL (ref 0.0–0.5)
Eosinophils Relative: 1 %
HCT: 38.1 % — ABNORMAL LOW (ref 39.0–52.0)
Hemoglobin: 12.6 g/dL — ABNORMAL LOW (ref 13.0–17.0)
Immature Granulocytes: 1 %
Lymphocytes Relative: 38 %
Lymphs Abs: 3.5 10*3/uL (ref 0.7–4.0)
MCH: 31.3 pg (ref 26.0–34.0)
MCHC: 33.1 g/dL (ref 30.0–36.0)
MCV: 94.5 fL (ref 80.0–100.0)
Monocytes Absolute: 1.3 10*3/uL — ABNORMAL HIGH (ref 0.1–1.0)
Monocytes Relative: 14 %
Neutro Abs: 4.4 10*3/uL (ref 1.7–7.7)
Neutrophils Relative %: 46 %
Platelets: 254 10*3/uL (ref 150–400)
RBC: 4.03 MIL/uL — ABNORMAL LOW (ref 4.22–5.81)
RDW: 12.9 % (ref 11.5–15.5)
WBC: 9.4 10*3/uL (ref 4.0–10.5)
nRBC: 0 % (ref 0.0–0.2)

## 2020-10-31 LAB — BASIC METABOLIC PANEL
Anion gap: 13 (ref 5–15)
BUN: 25 mg/dL — ABNORMAL HIGH (ref 8–23)
CO2: 22 mmol/L (ref 22–32)
Calcium: 9.1 mg/dL (ref 8.9–10.3)
Chloride: 97 mmol/L — ABNORMAL LOW (ref 98–111)
Creatinine, Ser: 1.31 mg/dL — ABNORMAL HIGH (ref 0.61–1.24)
GFR, Estimated: 51 mL/min — ABNORMAL LOW (ref >60)
Glucose, Bld: 150 mg/dL — ABNORMAL HIGH (ref 70–99)
Potassium: 4.6 mmol/L (ref 3.5–5.1)
Sodium: 132 mmol/L — ABNORMAL LOW (ref 135–145)

## 2020-10-31 LAB — PROCALCITONIN: Procalcitonin: 0.1 ng/mL

## 2020-10-31 LAB — CK: Total CK: 263 U/L (ref 49–397)

## 2020-10-31 LAB — TROPONIN I (HIGH SENSITIVITY)
Troponin I (High Sensitivity): 23 ng/L — ABNORMAL HIGH (ref <18)
Troponin I (High Sensitivity): 28 ng/L — ABNORMAL HIGH (ref <18)

## 2020-10-31 LAB — TSH: TSH: 3.965 u[IU]/mL (ref 0.350–4.500)

## 2020-10-31 MED ORDER — ACETAMINOPHEN 325 MG PO TABS
650.0000 mg | ORAL_TABLET | Freq: Once | ORAL | Status: AC
  Administered 2020-10-31: 12:00:00 650 mg via ORAL
  Filled 2020-10-31: qty 2

## 2020-10-31 MED ORDER — MORPHINE SULFATE (PF) 2 MG/ML IV SOLN
1.0000 mg | INTRAVENOUS | Status: AC | PRN
  Administered 2020-10-31 – 2020-11-01 (×2): 1 mg via INTRAVENOUS
  Filled 2020-10-31 (×2): qty 1

## 2020-10-31 MED ORDER — ENOXAPARIN SODIUM 40 MG/0.4ML IJ SOSY
40.0000 mg | PREFILLED_SYRINGE | INTRAMUSCULAR | Status: DC
  Administered 2020-11-01 – 2020-11-02 (×2): 40 mg via SUBCUTANEOUS
  Filled 2020-10-31: qty 0.4

## 2020-10-31 MED ORDER — METOPROLOL TARTRATE 25 MG PO TABS
12.5000 mg | ORAL_TABLET | Freq: Two times a day (BID) | ORAL | Status: DC
  Administered 2020-10-31: 12.5 mg via ORAL
  Filled 2020-10-31: qty 1

## 2020-10-31 MED ORDER — FOLIC ACID 1 MG PO TABS
1.0000 mg | ORAL_TABLET | Freq: Every day | ORAL | Status: DC
  Administered 2020-10-31 – 2020-11-03 (×4): 1 mg via ORAL
  Filled 2020-10-31 (×3): qty 1

## 2020-10-31 MED ORDER — METFORMIN HCL 500 MG PO TABS
500.0000 mg | ORAL_TABLET | Freq: Two times a day (BID) | ORAL | Status: DC
  Administered 2020-11-01 (×2): 500 mg via ORAL
  Filled 2020-10-31 (×3): qty 1

## 2020-10-31 MED ORDER — ACETAMINOPHEN 325 MG PO TABS: 650.0000 mg | ORAL_TABLET | Freq: Four times a day (QID) | ORAL | Status: DC | PRN

## 2020-10-31 MED ORDER — ACETAMINOPHEN 650 MG RE SUPP: 650.0000 mg | Freq: Four times a day (QID) | RECTAL | Status: DC | PRN

## 2020-10-31 MED ORDER — ONDANSETRON HCL 4 MG PO TABS: 4.0000 mg | ORAL_TABLET | Freq: Four times a day (QID) | ORAL | Status: DC | PRN

## 2020-10-31 MED ORDER — ONDANSETRON HCL 4 MG/2ML IJ SOLN: 4.0000 mg | Freq: Four times a day (QID) | INTRAMUSCULAR | Status: DC | PRN

## 2020-10-31 MED ORDER — SIMVASTATIN 20 MG PO TABS
20.0000 mg | ORAL_TABLET | Freq: Every day | ORAL | Status: DC
  Administered 2020-10-31 – 2020-11-02 (×3): 20 mg via ORAL
  Filled 2020-10-31 (×3): qty 1

## 2020-10-31 MED ORDER — OXYCODONE HCL 5 MG PO TABS
5.0000 mg | ORAL_TABLET | Freq: Once | ORAL | Status: AC
  Administered 2020-10-31: 14:00:00 5 mg via ORAL
  Filled 2020-10-31: qty 1

## 2020-10-31 MED ORDER — TERAZOSIN HCL 1 MG PO CAPS
1.0000 mg | ORAL_CAPSULE | Freq: Every day | ORAL | Status: DC
  Administered 2020-11-01 – 2020-11-02 (×2): 1 mg via ORAL
  Filled 2020-10-31 (×3): qty 1

## 2020-10-31 MED ORDER — OXYCODONE-ACETAMINOPHEN 5-325 MG PO TABS
1.0000 | ORAL_TABLET | Freq: Four times a day (QID) | ORAL | Status: AC | PRN
  Administered 2020-11-01: 1 via ORAL
  Filled 2020-10-31 (×2): qty 1

## 2020-10-31 MED ORDER — DIVALPROEX SODIUM 250 MG PO DR TAB
250.0000 mg | DELAYED_RELEASE_TABLET | Freq: Two times a day (BID) | ORAL | Status: DC
  Administered 2020-11-01 – 2020-11-03 (×5): 250 mg via ORAL
  Filled 2020-10-31 (×6): qty 1
  Filled 2020-10-31 (×2): qty 2

## 2020-10-31 MED ORDER — METFORMIN HCL 500 MG PO TABS
500.0000 mg | ORAL_TABLET | Freq: Two times a day (BID) | ORAL | Status: DC
Start: 2020-11-01 — End: 2020-10-31

## 2020-10-31 NOTE — ED Provider Notes (Signed)
[facility] Emergency Department Provider Note ____________________________________________   Event Date/Time   First MD Initiated Contact with Patient 10/31/20 1153     (approximate)  I have reviewed the triage vital signs and the nursing notes.   HISTORY  Chief Complaint Fall (Knee pain, fall yesterday)  HPI Mark Dougherty is a 87 y.o. male with history of atrial fibrillation, diabetes, presents to the emergency department for treatment and evaluation of visual hallucinations and knee pain after nonsyncopal fall at home yesterday.  His wife was able to get him up and back into his motorized chair.  He has continued to have pain with attempts at weightbearing.  At baseline, he is only able to bear weight on the left and since the injury, he has been very unsteady when transitioning from chair to bed or wheelchair.  Patient denies any head strike or neck pain.  No loss of consciousness. He believes the reason he is having hallucinations is due to being off          Past Medical History:  Diagnosis Date   A-fib [facility])    a. in perioperative state-->converted to NSR on amiodarone   Atrial flutter (HCC)    a. s/p DCCV 06/29/2015   BPH (benign prostatic hyperplasia)    CAD (coronary artery disease)    a. s/p CABG in 1996 in [place]   Carotid artery stenosis    a. s/p left-sided CEA 2000; b. 02/2016 Carotid U/S: <39% bilat dzs; c. 11/2017 Carotid U/S: 1-39% bilat ICA stenosis.   Chronic combined systolic and diastolic CHF (congestive heart failure) (HCC)    a. echo 2011 with EF 40%; b. 11/2018 Echo: EF 55-60%. Mod conc LVH.   Diabetes mellitus    Hyperlipidemia    Hypotension    mild   Ischemic cardiomyopathy    a. 2011 Echo: EF 40%; b. 11/2018 Echo: EF 55-60%, mod conc LVH.   Left wrist fracture    Peripheral neuropathy    Spinal stenosis    Thyroid disease    a. low TSH and elevated free T4 in June 2015, amiodarone was stopped at that time    Patient  Active Problem List   Diagnosis Date Noted   Frequent falls 10/31/2020   Knee injury 12/21/2018   Unilateral primary osteoarthritis, left knee 11/28/2017   Unilateral primary osteoarthritis, right knee 11/28/2017   Syncope 11/29/2016   Acute left-sided low back pain without sciatica 11/12/2016   Chronic systolic CHF (congestive heart failure) (HCC)    Ischemic cardiomyopathy    Thyroid disease    Typical atrial flutter (HCC)    Atrial flutter (HCC) 05/31/2015   Neuropathy 05/31/2015   PAF (paroxysmal atrial fibrillation) (HCC) 01/04/2011   Congestive dilated cardiomyopathy (HCC) 09/28/2010   Dehydration 09/14/2010   Diabetes mellitus type 2, controlled, with complications (HCC) 01/09/2010   Hyperlipidemia 01/09/2010   CAD (coronary artery disease) 01/09/2010   Carotid stenosis 01/09/2010    Past Surgical History:  Procedure Laterality Date   BACK SURGERY  1975   CARDIOVERSION N/A 08/22/2017   Procedure: CARDIOVERSION;  Surgeon: Antonieta Iba, MD;  Location: [facility] ORS;  Service: Cardiovascular;  Laterality: N/A;   CAROTID ENDARTERECTOMY     2010   CORONARY ARTERY BYPASS GRAFT  1996   x3   ELECTROPHYSIOLOGIC STUDY N/A 06/29/2015   Procedure: Cardioversion;  Surgeon: Antonieta Iba, MD;  Location: [facility] ORS;  Service: Cardiovascular;  Laterality: N/A;   EYE SURGERY  2013  REPLACEMENT TOTAL KNEE     left   TOOTH EXTRACTION      Prior to Admission medications   Medication Sig Start Date End Date Taking? Authorizing Provider  divalproex (DEPAKOTE) 125 MG DR tablet Take 250 mg by mouth 2 (two) times daily.   Yes [provider]  finasteride (PROSCAR) 5 MG tablet Take 5 mg by mouth daily.   Yes [provider]  folic acid (FOLVITE) 1 MG tablet Take 1 mg by mouth daily.     Yes [provider]  HYDROcodone-acetaminophen (NORCO/VICODIN) 5-325 MG tablet Take 1 tablet by mouth every 6 (six) hours as needed for moderate pain. 03/14/20  Yes Kathryne Hitch, MD  metFORMIN (GLUCOPHAGE) 500 MG tablet Take 500 mg by mouth 2 (two) times daily with a meal.     Yes [provider]  metoprolol tartrate (LOPRESSOR) 25 MG tablet Take 0.5 tablets (12.5 mg total) by mouth 2 (two) times daily. 08/16/20 11/14/20 Yes Gollan, Tollie Pizza, MD  Multiple Vitamins-Minerals (CENTRUM PO) Take 1 tablet by mouth daily.   Yes [provider]  simvastatin (ZOCOR) 20 MG tablet Take 20 mg by mouth at bedtime.   Yes [provider]  terazosin (HYTRIN) 1 MG capsule Take 1 mg by mouth at bedtime.   Yes [provider]  apixaban (ELIQUIS) 5 MG TABS tablet Take 5 mg by mouth 2 (two) times daily. Patient not taking: No sig reported    [provider]    Allergies Patient has no known allergies.  Family History  Family history unknown: Yes    Social History Social History   Tobacco Use   Smoking status: Never   Smokeless tobacco: Former  Substance Use Topics   Alcohol use: No   Drug use: No    Review of Systems  Constitutional: No fever/chills Eyes: No visual changes. ENT: No sore throat. Cardiovascular: Denies chest pain. Respiratory: Denies shortness of breath. Gastrointestinal: No abdominal pain.  No nausea, no vomiting.  No diarrhea.  No constipation. Genitourinary: Negative for dysuria. Musculoskeletal: Negative for back pain. Positive for left knee pain Skin: Negative for rash. Neurological: Negative for headaches, focal weakness or numbness. Psychiatric: Positive for visual hallucinations. ____________________________________________   PHYSICAL EXAM:  VITAL SIGNS: ED Triage Vitals  Enc Vitals Group     BP 10/31/20 1148 (!) 145/78     Pulse Rate 10/31/20 1148 (!) 105     Resp 10/31/20 1148 18     Temp 10/31/20 1148 98.8 F (37.1 C)     Temp src --      SpO2 10/31/20 1148 98 %     Weight --      Height --      Head Circumference --      Peak Flow --      Pain Score 10/31/20 1149 6      Pain Loc --      Pain Edu? --      Excl. in GC? --     Constitutional: Alert and oriented. Well appearing and in no acute distress. Eyes: Conjunctivae are normal. PERRL. EOMI. Head: Atraumatic. Nose: No congestion/rhinnorhea. Mouth/Throat: Mucous membranes are moist.  Oropharynx non-erythematous. Neck: No stridor.   Hematological/Lymphatic/Immunilogical: No cervical lymphadenopathy. Cardiovascular: Normal rate, regular rhythm. Grossly normal heart sounds.  Good peripheral circulation. Respiratory: Normal respiratory effort.  No retractions. Lungs CTAB. Gastrointestinal: Soft and nontender. No distention. No abdominal bruits. No CVA tenderness. Genitourinary:  Musculoskeletal: No lower extremity tenderness nor  edema.  No joint effusions. Left knee tenderness in anterior aspect without focal area of pain. No swelling or deformity. No focal hip pain. Neurologic:  Normal speech and language. No gross focal neurologic deficits are appreciated. No gait instability. Skin:  Skin is warm, dry and intact. No rash noted. Psychiatric: Mood and affect are normal. Speech and behavior are normal.  ____________________________________________   LABS (all labs ordered are listed, but only abnormal results are displayed)  Labs Reviewed  BASIC METABOLIC PANEL - Abnormal; Notable for the following components:      Result Value   Sodium 132 (*)    Chloride 97 (*)    Glucose, Bld 150 (*)    BUN 25 (*)    Creatinine, Ser 1.31 (*)    GFR, Estimated 51 (*)    All other components within normal limits  CBC WITH DIFFERENTIAL/PLATELET - Abnormal; Notable for the following components:   RBC 4.03 (*)    Hemoglobin 12.6 (*)    HCT 38.1 (*)    Monocytes Absolute 1.3 (*)    All other components within normal limits  URINALYSIS, COMPLETE (UACMP) WITH MICROSCOPIC - Abnormal; Notable for the following components:   Color, Urine YELLOW (*)    APPearance CLEAR (*)    Ketones, ur 5 (*)    All other  components within normal limits  TROPONIN I (HIGH SENSITIVITY) - Abnormal; Notable for the following components:   Troponin I (High Sensitivity) 23 (*)    All other components within normal limits  TROPONIN I (HIGH SENSITIVITY) - Abnormal; Notable for the following components:   Troponin I (High Sensitivity) 28 (*)    All other components within normal limits  RESP PANEL BY RT-PCR (FLU A&B, COVID) ARPGX2  TSH  CK  AMMONIA  PROCALCITONIN  BASIC METABOLIC PANEL  CBC  VITAMIN B12  PROCALCITONIN   ____________________________________________  EKG  ED ECG REPORT I, Ingrid Shifrin, FNP-BC personally viewed and interpreted this ECG.   Date: 10/31/2020  EKG Time: 1154  Rate: 106  Rhythm: sinus tachycardia  Axis: normal  Intervals:none  ST&T Change: No ST elevation  ____________________________________________  RADIOLOGY  ED MD interpretation:    CT head negative for acute concerns.  I, Kem Boroughs, personally viewed and evaluated these images (plain radiographs) as part of my medical decision making, as well as reviewing the written report by the radiologist.  Official radiology report(s): CT Head Wo Contrast  Result Date: 10/31/2020 CLINICAL DATA:  87 year old with fall. EXAM: CT HEAD WITHOUT CONTRAST TECHNIQUE: Contiguous axial images were obtained from the base of the skull through the vertex without intravenous contrast. COMPARISON:  05/02/2020 FINDINGS: Brain: Stable mild cerebral atrophy. No evidence for acute hemorrhage, mass lesion, midline shift, hydrocephalus or large infarct. Stable low-density in the white matter is suggestive for chronic changes. Vascular: No hyperdense vessel or unexpected calcification. Skull: Normal. Negative for fracture or focal lesion. Sinuses/Orbits: Mucosal thickening and possible polyp in the left sphenoid sinus. Findings are similar to the previous examination. Other: None IMPRESSION: 1. No acute intracranial abnormality. 2. Persistent left  sphenoid sinus disease. Electronically Signed   By: Richarda Overlie M.D.   On: 10/31/2020 14:08   DG Chest Port 1 View  Result Date: 10/31/2020 CLINICAL DATA:  Weakness.  Multiple falls. EXAM: PORTABLE CHEST 1 VIEW COMPARISON:  01/26/2020 FINDINGS: Postoperative changes in the mediastinum. Shallow inspiration with elevation of the right hemidiaphragm. Normal heart size and pulmonary vascularity. Patchy airspace disease suggested in the lungs, progressing since  previous study. This may represent multifocal pneumonia. No pleural effusions. No pneumothorax. Mediastinal contours appear intact. Calcification of the aorta. Degenerative changes in the spine and shoulders. IMPRESSION: Developing patchy infiltrates in the lungs. Chronic elevation of right hemidiaphragm. Electronically Signed   By: Burman Nieves M.D.   On: 10/31/2020 19:24   DG Knee Complete 4 Views Left  Result Date: 10/31/2020 CLINICAL DATA:  Left leg pain after fall. EXAM: LEFT KNEE - COMPLETE 4+ VIEW COMPARISON:  None. FINDINGS: No evidence of fracture, dislocation, or joint effusion. Moderate narrowing and osteophyte formation is seen involving the medial and lateral joint spaces. Soft tissues are unremarkable. IMPRESSION: Moderate degenerative joint disease.  No acute abnormality seen. Electronically Signed   By: Lupita Raider M.D.   On: 10/31/2020 12:58   DG Hip Unilat W or Wo Pelvis 2-3 Views Left  Result Date: 10/31/2020 CLINICAL DATA:  Bilateral leg pain after fall yesterday. EXAM: DG HIP (WITH OR WITHOUT PELVIS) 2-3V LEFT COMPARISON:  None. FINDINGS: There is no evidence of hip fracture or dislocation. Mild osteophyte formation is seen involving the left hip. IMPRESSION: Mild degenerative joint disease of the left hip. No definite acute abnormality is seen. Electronically Signed   By: Lupita Raider M.D.   On: 10/31/2020 12:57    ____________________________________________   PROCEDURES  Procedure(s) performed (including Critical  Care):  Procedures  ____________________________________________   INITIAL IMPRESSION / ASSESSMENT AND PLAN     87 year old male lives at home presents to the emergency department after mechanical fall. He is complaining of knee pain. Wife also has some concerns about visual hallucinations due to not having his depakote. See HPI.  DIFFERENTIAL DIAGNOSIS  Patella fracture, ligament strain of the knee, hip injury, medication withdrawal.  ED COURSE  Clinical Course as of 10/31/20 2037  Mon Oct 31, 2020  1500 Contacted lab regarding pending labs that were collected hours ago with no results. Dahlia Client reports she will try and add them on to blood already in lab. [CT]  1857  Knee and hip image negative for bony abnormality. Patient to be admitted for weakness, altered mental status change and visual hallucinations. Wife states that she is unable to care for him as he is. Patient agrees to admission. [CT]    Clinical Course User Index [CT] Cope Marte B, FNP   ___________________________________________   FINAL CLINICAL IMPRESSION(S) / ED DIAGNOSES  Final diagnoses:  Fall, initial encounter  Acute pain of left knee  Visual hallucinations  Altered mental status, unspecified altered mental status type     ED Discharge Orders     None        ZIYAN SCHOON was evaluated in Emergency Department on 10/31/2020 for the symptoms described in the history of present illness. He was evaluated in the context of the global COVID-19 pandemic, which necessitated consideration that the patient might be at risk for infection with the SARS-CoV-2 virus that causes COVID-19. Institutional protocols and algorithms that pertain to the evaluation of patients at risk for COVID-19 are in a state of rapid change based on information released by regulatory bodies including the CDC and federal and state organizations. These policies and algorithms were followed during the patient's care in the  ED.   Note:  This document was prepared using Dragon voice recognition software and may include unintentional dictation errors.    Chinita Pester, FNP 10/31/20 2037    Concha Se, MD 11/01/20 [phone]

## 2020-10-31 NOTE — Progress Notes (Signed)
   10/31/20 1340  Clinical Encounter Type  Visited With Patient and family together  Visit Type Initial;Social support;Spiritual support  Referral From Other (Comment) (rounding)  Spiritual Encounters  Spiritual Needs Other (Comment) (general support)  Chaplain Burris provided reflective listening and support to PT and family member. Mr.Seyler seemed to be resting comfortably but with some fatigue from waiting. Chaplain Burris offered encouragement and continued to support.

## 2020-10-31 NOTE — ED Triage Notes (Signed)
Fall , leg pain

## 2020-10-31 NOTE — H&P (Addendum)
History and Physical   Mark Dougherty FFM:[id number] DOB: 1926-05-02 DOA: 10/31/2020  PCP: [name], MD  Outpatient Specialists: Dr. Mariah Milling, cardiology Patient coming from: home via EMS  I have personally briefly reviewed patient's old medical records in [facility] EMR.  Chief Concern: fall  HPI: Mark Dougherty is a 87 y.o. male with medical history significant for hyperlipidemia, paroxysmal atrial fibrillation, previously on anticoagulation, non-insulin-dependent diabetes mellitus, chronic pain, BPH, CAD with remote history of three-vessel CABG, CKD 3A, presents to the emergency department for chief concerns of frequent falls.  He was walking out of the restroom, when he felt weak and his left leg 'gave out' on him and he fell down, landing on his left knee.  He reports that since the fall, he is unable to left up this left leg. Prior to falling, he gets around in a [place].  He reports the pain is at his left knee and is persistent.  He endorses minimal relief with the oral pain medication given by EDP.  Patient denies head trauma and loss of consciousness.  Spouse at bedside is unsure as this was unwitnessed.  After the fall he, he endorses associated mild shortness of breath.  He denies cough, fever, chest pain, nausea, vomiting.  At bedside he is able to tell me his name and his age and he knows that he is in the hospital.  He identifies his spouse at bedside.  He endorses unintentional lost of 12 pounds in 6 months - 1 year  Social history: He lives with his spouse. He denies tobacco use, however is exposed to his spouse smoking in the house. He denies etoh and recreational drug use. He is retired and formerly worked as a Music therapist.  Vaccination history: Two Pfizer vaccines, 07/01/19 and 07/22/19. No Booster  ROS: Constitutional: + weight change, no fever ENT/Mouth: no sore throat, no rhinorrhea Eyes: no eye pain, no vision changes Cardiovascular: no chest pain, + dyspnea,   no edema, no palpitations Respiratory: no cough, no sputum, no wheezing Gastrointestinal: no nausea, no vomiting, no diarrhea, no constipation Genitourinary: no urinary incontinence, no dysuria, no hematuria Musculoskeletal: no arthralgias, no myalgias Skin: no skin lesions, no pruritus, Neuro: + weakness, no loss of consciousness, no syncope Psych: no anxiety, no depression, + decrease appetite Heme/Lymph: no bruising, no bleeding  ED Course: Discussed with ED provider, patient requiring hospitalization for frequent fall, hallucination.  Vitals in the emergency department was remarkable for temperature of 98.8, respiration rate of 18, heart rate 105, blood pressure 145/78, SPO2 of 98% on room air.  Labs in the emergency department was remarkable for WBC 9.4, hemoglobin 12.6, platelets 254, sodium 132, potassium 4.6, chloride 97, bicarb 22, BUN 25, serum creatinine of 1.31, nonfasting blood glucose 152.  CK was 263.  Troponin was 23.  UA was negative for leukocytes and nitrates.  TSH was within normal limits.  Assessment/Plan  Active Problems:   Diabetes mellitus type 2, controlled, with complications (HCC)   Hyperlipidemia   CAD (coronary artery disease)   PAF (paroxysmal atrial fibrillation) (HCC)   Atrial flutter (HCC)   Frequent falls   # Frequent falls-etiology work-up in progress # Visual and auditory hallucinations - Checking B12, ammonia, procalcitonin, COVID - TSH was within normal limits - Portable chest x-ray-minute negative and no implants, I will order an MRI of the brain  - PT, OT, TOC - Fall precaution - MedSurg, observation, telemetry - Portable chest x-ray was remarkable for developing infiltrate -  We will order procalcitonin which was negative and given the patient does not have leukocytosis or fever, antibiotics is not indicated at this time - Pain control: Oxycodone 5 mg every 6 hours as needed for moderate pain, 1 day ordered; morphine 1 mg IV every 4 hours  as needed for severe pain, 2 doses ordered  # Unintentional weight loss-patient states its been due to poor appetite not early satiety - Dietitian has been consulted - Outpatient follow-up  # History of auditory and visual hallucination-resumed home Depakote 125 mg p.o. twice daily - Patient spouse at bedside states that he ran out of his medication since last Wednesday, last dose was Wednesday evening - Depakote 125 mg p.o. twice daily resumed for 2200 on day of admission  # Hypertension-elevated - Patient has not taken his antihypertensives medication prior to presentation to the emergency department - Metoprolol tartrate 12.5 mg p.o. twice daily resumed, terazosin 1 mg nightly resumed  # History of CAD status post three-vessel CABG-greater than 15 years ago # Non-insulin-dependent diabetes mellitus-metformin 500 mg twice daily # Hyperlipidemia-simvastatin 20 mg nightly resumed # BPH-finasteride 5 mg daily home medication has not been resumed # History of paroxysmal atrial fibrillation-previously on Eliquis 5 mg twice daily, patient is no longer prescribed this medication due to history of frequent falls  COVID PCR/influenza A/influenza B is in process  Chart reviewed.   DVT prophylaxis: Enoxaparin 40 mg subcutaneous every 24 hours Code Status: Full code Diet: Heart healthy Family Communication: Updated spouse at bedside Disposition Plan: Pending clinical course Consults ordered: PT, OT, TOC, dietitian Admission status: MedSurg, observation, telemetry for 24 hours ordered  Past Medical History:  Diagnosis Date   A-fib [facility])    a. in perioperative state-->converted to NSR on amiodarone   Atrial flutter (HCC)    a. s/p DCCV 06/29/2015   BPH (benign prostatic hyperplasia)    CAD (coronary artery disease)    a. s/p CABG in 1996 in [place]   Carotid artery stenosis    a. s/p left-sided CEA 2000; b. 02/2016 Carotid U/S: <39% bilat dzs; c. 11/2017 Carotid U/S: 1-39% bilat ICA stenosis.    Chronic combined systolic and diastolic CHF (congestive heart failure) (HCC)    a. echo 2011 with EF 40%; b. 11/2018 Echo: EF 55-60%. Mod conc LVH.   Diabetes mellitus    Hyperlipidemia    Hypotension    mild   Ischemic cardiomyopathy    a. 2011 Echo: EF 40%; b. 11/2018 Echo: EF 55-60%, mod conc LVH.   Left wrist fracture    Peripheral neuropathy    Spinal stenosis    Thyroid disease    a. low TSH and elevated free T4 in June 2015, amiodarone was stopped at that time   Past Surgical History:  Procedure Laterality Date   BACK SURGERY  1975   CARDIOVERSION N/A 08/22/2017   Procedure: CARDIOVERSION;  Surgeon: Antonieta Iba, MD;  Location: [facility] ORS;  Service: Cardiovascular;  Laterality: N/A;   CAROTID ENDARTERECTOMY     2010   CORONARY ARTERY BYPASS GRAFT  1996   x3   ELECTROPHYSIOLOGIC STUDY N/A 06/29/2015   Procedure: Cardioversion;  Surgeon: Antonieta Iba, MD;  Location: [facility] ORS;  Service: Cardiovascular;  Laterality: N/A;   EYE SURGERY  2013   REPLACEMENT TOTAL KNEE     left   TOOTH EXTRACTION     Social History:  reports that he has never smoked. He has quit using smokeless tobacco. He reports that he does  not drink alcohol and does not use drugs.  No Known Allergies Family History  Family history unknown: Yes   Family history: Family history reviewed and not pertinent  Prior to Admission medications   Medication Sig Start Date End Date Taking? Authorizing Provider  apixaban (ELIQUIS) 5 MG TABS tablet Take 5 mg by mouth 2 (two) times daily. Patient not taking: Reported on 08/16/2020    [provider]  finasteride (PROSCAR) 5 MG tablet Take 5 mg by mouth daily.    [provider]  folic acid (FOLVITE) 1 MG tablet Take 1 mg by mouth daily.      [provider]  HYDROcodone-acetaminophen (NORCO/VICODIN) 5-325 MG tablet Take 1 tablet by mouth every 6 (six) hours as needed for moderate pain. 03/14/20   Kathryne Hitch, MD  metFORMIN  (GLUCOPHAGE) 500 MG tablet Take 500 mg by mouth 2 (two) times daily with a meal.      [provider]  metoprolol tartrate (LOPRESSOR) 25 MG tablet Take 0.5 tablets (12.5 mg total) by mouth 2 (two) times daily. 08/16/20 11/14/20  Antonieta Iba, MD  Multiple Vitamins-Minerals (CENTRUM PO) Take 1 tablet by mouth daily.    [provider]  simvastatin (ZOCOR) 20 MG tablet Take 20 mg by mouth at bedtime.    [provider]  terazosin (HYTRIN) 1 MG capsule Take 1 mg by mouth at bedtime.    [provider]   Physical Exam: Vitals:   10/31/20 1422 10/31/20 1600 10/31/20 1709 10/31/20 1823  BP: (!) 141/70 136/70 130/70 131/77  Pulse: 84 89 83 81  Resp: 14 18 17 13   Temp:      SpO2: 96% 97% 98% 98%   Constitutional: appears age-appropriate, NAD, calm, comfortable Eyes: PERRL, lids and conjunctivae normal ENMT: Mucous membranes are dry. Posterior pharynx clear of any exudate or lesions. Age-appropriate dentition. Hearing appropriate Neck: normal, supple, no masses, no thyromegaly Respiratory: clear to auscultation bilaterally, no wheezing, no crackles. Normal respiratory effort. No accessory muscle use.  Cardiovascular: Regular rate and rhythm, no murmurs / rubs / gallops. No extremity edema. 2+ pedal pulses. No carotid bruits.  Abdomen: no tenderness, no masses palpated, no hepatosplenomegaly. Bowel sounds positive.  Musculoskeletal: no clubbing / cyanosis. No joint deformity upper and lower extremities.  Decreased range of motion of the left hip and left knee.  Appropriate ROM in all other extremities. no contractures, no atrophy. Normal muscle tone.  Skin: no rashes, lesions, ulcers. No induration Neurologic: Sensation intact. Strength 5/5 in all 4.  Psychiatric: Normal judgment and insight. Alert and oriented x 3. Normal mood.   EKG: independently reviewed, showing sinus tachycardia with rate of 106, QTc 471  Chest x-ray on Admission: I personally reviewed  and I agree with radiologist reading as below.  CT Head Wo Contrast  Result Date: 10/31/2020 CLINICAL DATA:  87 year old with fall. EXAM: CT HEAD WITHOUT CONTRAST TECHNIQUE: Contiguous axial images were obtained from the base of the skull through the vertex without intravenous contrast. COMPARISON:  05/02/2020 FINDINGS: Brain: Stable mild cerebral atrophy. No evidence for acute hemorrhage, mass lesion, midline shift, hydrocephalus or large infarct. Stable low-density in the white matter is suggestive for chronic changes. Vascular: No hyperdense vessel or unexpected calcification. Skull: Normal. Negative for fracture or focal lesion. Sinuses/Orbits: Mucosal thickening and possible polyp in the left sphenoid sinus. Findings are similar to the previous examination. Other: None IMPRESSION: 1. No acute intracranial abnormality. 2. Persistent left sphenoid sinus disease. Electronically Signed  By: Richarda Overlie M.D.   On: 10/31/2020 14:08   DG Knee Complete 4 Views Left  Result Date: 10/31/2020 CLINICAL DATA:  Left leg pain after fall. EXAM: LEFT KNEE - COMPLETE 4+ VIEW COMPARISON:  None. FINDINGS: No evidence of fracture, dislocation, or joint effusion. Moderate narrowing and osteophyte formation is seen involving the medial and lateral joint spaces. Soft tissues are unremarkable. IMPRESSION: Moderate degenerative joint disease.  No acute abnormality seen. Electronically Signed   By: Lupita Raider M.D.   On: 10/31/2020 12:58   DG Hip Unilat W or Wo Pelvis 2-3 Views Left  Result Date: 10/31/2020 CLINICAL DATA:  Bilateral leg pain after fall yesterday. EXAM: DG HIP (WITH OR WITHOUT PELVIS) 2-3V LEFT COMPARISON:  None. FINDINGS: There is no evidence of hip fracture or dislocation. Mild osteophyte formation is seen involving the left hip. IMPRESSION: Mild degenerative joint disease of the left hip. No definite acute abnormality is seen. Electronically Signed   By: Lupita Raider M.D.   On: 10/31/2020 12:57     Labs on Admission: I have personally reviewed following labs  CBC: Recent Labs  Lab 10/31/20 1157  WBC 9.4  NEUTROABS 4.4  HGB 12.6*  HCT 38.1*  MCV 94.5  PLT 254   Basic Metabolic Panel: Recent Labs  Lab 10/31/20 1157  NA 132*  K 4.6  CL 97*  CO2 22  GLUCOSE 150*  BUN 25*  CREATININE 1.31*  CALCIUM 9.1   GFR: CrCl cannot be calculated (Unknown ideal weight.).  Cardiac Enzymes: Recent Labs  Lab 10/31/20 1157  CKTOTAL 263   Thyroid Function Tests: Recent Labs    10/31/20 1157  TSH 3.965   Urine analysis:    Component Value Date/Time   COLORURINE YELLOW (A) 10/31/2020 1156   APPEARANCEUR CLEAR (A) 10/31/2020 1156   APPEARANCEUR Clear 10/16/2013 0852   LABSPEC 1.020 10/31/2020 1156   LABSPEC 1.023 10/16/2013 0852   PHURINE 5.0 10/31/2020 1156   GLUCOSEU NEGATIVE 10/31/2020 1156   GLUCOSEU Negative 10/16/2013 0852   HGBUR NEGATIVE 10/31/2020 1156   BILIRUBINUR NEGATIVE 10/31/2020 1156   BILIRUBINUR Negative 10/16/2013 0852   KETONESUR 5 (A) 10/31/2020 1156   PROTEINUR NEGATIVE 10/31/2020 1156   UROBILINOGEN 0.2 09/12/2010 0120   NITRITE NEGATIVE 10/31/2020 1156   LEUKOCYTESUR NEGATIVE 10/31/2020 1156   LEUKOCYTESUR Negative 10/16/2013 0852   Dr. Sedalia Muta [facility]  If 7PM-7AM, please contact overnight-coverage provider If 7AM-7PM, please contact day coverage provider [email or web address]  10/31/2020, 7:00 PM

## 2020-11-01 DIAGNOSIS — I130 Hypertensive heart and chronic kidney disease with heart failure and stage 1 through stage 4 chronic kidney disease, or unspecified chronic kidney disease: Secondary | ICD-10-CM | POA: Diagnosis present

## 2020-11-01 DIAGNOSIS — Z9181 History of falling: Secondary | ICD-10-CM | POA: Diagnosis not present

## 2020-11-01 DIAGNOSIS — R5381 Other malaise: Secondary | ICD-10-CM | POA: Diagnosis present

## 2020-11-01 DIAGNOSIS — I2510 Atherosclerotic heart disease of native coronary artery without angina pectoris: Secondary | ICD-10-CM | POA: Diagnosis present

## 2020-11-01 DIAGNOSIS — Z66 Do not resuscitate: Secondary | ICD-10-CM | POA: Diagnosis present

## 2020-11-01 DIAGNOSIS — I255 Ischemic cardiomyopathy: Secondary | ICD-10-CM | POA: Diagnosis present

## 2020-11-01 DIAGNOSIS — F0390 Unspecified dementia without behavioral disturbance: Secondary | ICD-10-CM | POA: Diagnosis present

## 2020-11-01 DIAGNOSIS — Z951 Presence of aortocoronary bypass graft: Secondary | ICD-10-CM | POA: Diagnosis not present

## 2020-11-01 DIAGNOSIS — E785 Hyperlipidemia, unspecified: Secondary | ICD-10-CM | POA: Diagnosis present

## 2020-11-01 DIAGNOSIS — E1142 Type 2 diabetes mellitus with diabetic polyneuropathy: Secondary | ICD-10-CM | POA: Diagnosis present

## 2020-11-01 DIAGNOSIS — I5042 Chronic combined systolic (congestive) and diastolic (congestive) heart failure: Secondary | ICD-10-CM | POA: Diagnosis present

## 2020-11-01 DIAGNOSIS — Y92009 Unspecified place in unspecified non-institutional (private) residence as the place of occurrence of the external cause: Secondary | ICD-10-CM | POA: Diagnosis not present

## 2020-11-01 DIAGNOSIS — N1831 Chronic kidney disease, stage 3a: Secondary | ICD-10-CM | POA: Diagnosis present

## 2020-11-01 DIAGNOSIS — I4892 Unspecified atrial flutter: Secondary | ICD-10-CM | POA: Diagnosis present

## 2020-11-01 DIAGNOSIS — G8929 Other chronic pain: Secondary | ICD-10-CM | POA: Diagnosis present

## 2020-11-01 DIAGNOSIS — R441 Visual hallucinations: Secondary | ICD-10-CM | POA: Diagnosis present

## 2020-11-01 DIAGNOSIS — E1122 Type 2 diabetes mellitus with diabetic chronic kidney disease: Secondary | ICD-10-CM | POA: Diagnosis present

## 2020-11-01 DIAGNOSIS — I480 Paroxysmal atrial fibrillation: Secondary | ICD-10-CM | POA: Diagnosis present

## 2020-11-01 DIAGNOSIS — R296 Repeated falls: Secondary | ICD-10-CM | POA: Diagnosis present

## 2020-11-01 DIAGNOSIS — Z6827 Body mass index (BMI) 27.0-27.9, adult: Secondary | ICD-10-CM | POA: Diagnosis not present

## 2020-11-01 DIAGNOSIS — Z96652 Presence of left artificial knee joint: Secondary | ICD-10-CM | POA: Diagnosis present

## 2020-11-01 DIAGNOSIS — W19XXXA Unspecified fall, initial encounter: Secondary | ICD-10-CM | POA: Diagnosis present

## 2020-11-01 DIAGNOSIS — R634 Abnormal weight loss: Secondary | ICD-10-CM | POA: Diagnosis present

## 2020-11-01 DIAGNOSIS — N400 Enlarged prostate without lower urinary tract symptoms: Secondary | ICD-10-CM | POA: Diagnosis present

## 2020-11-01 DIAGNOSIS — Z20822 Contact with and (suspected) exposure to covid-19: Secondary | ICD-10-CM | POA: Diagnosis present

## 2020-11-01 DIAGNOSIS — M25561 Pain in right knee: Secondary | ICD-10-CM | POA: Diagnosis present

## 2020-11-01 LAB — GLUCOSE, CAPILLARY
Glucose-Capillary: 135 mg/dL — ABNORMAL HIGH (ref 70–99)
Glucose-Capillary: 155 mg/dL — ABNORMAL HIGH (ref 70–99)
Glucose-Capillary: 118 mg/dL — ABNORMAL HIGH (ref 70–99)

## 2020-11-01 LAB — CBC
HCT: 33.5 % — ABNORMAL LOW (ref 39.0–52.0)
Hemoglobin: 11.3 g/dL — ABNORMAL LOW (ref 13.0–17.0)
MCH: 31.2 pg (ref 26.0–34.0)
MCHC: 33.7 g/dL (ref 30.0–36.0)
MCV: 92.5 fL (ref 80.0–100.0)
Platelets: 225 10*3/uL (ref 150–400)
RBC: 3.62 MIL/uL — ABNORMAL LOW (ref 4.22–5.81)
RDW: 12.9 % (ref 11.5–15.5)
WBC: 9.9 10*3/uL (ref 4.0–10.5)
nRBC: 0 % (ref 0.0–0.2)

## 2020-11-01 LAB — PROCALCITONIN: Procalcitonin: 0.14 ng/mL

## 2020-11-01 LAB — BASIC METABOLIC PANEL
Anion gap: 5 (ref 5–15)
BUN: 28 mg/dL — ABNORMAL HIGH (ref 8–23)
CO2: 28 mmol/L (ref 22–32)
Calcium: 8.7 mg/dL — ABNORMAL LOW (ref 8.9–10.3)
Chloride: 99 mmol/L (ref 98–111)
Creatinine, Ser: 1.27 mg/dL — ABNORMAL HIGH (ref 0.61–1.24)
GFR, Estimated: 53 mL/min — ABNORMAL LOW (ref >60)
Glucose, Bld: 124 mg/dL — ABNORMAL HIGH (ref 70–99)
Potassium: 4.3 mmol/L (ref 3.5–5.1)
Sodium: 132 mmol/L — ABNORMAL LOW (ref 135–145)

## 2020-11-01 LAB — VITAMIN B12: Vitamin B-12: 1236 pg/mL — ABNORMAL HIGH (ref 180–914)

## 2020-11-01 LAB — HEMOGLOBIN A1C
Hgb A1c MFr Bld: 6.5 % — ABNORMAL HIGH (ref 4.8–5.6)
Mean Plasma Glucose: 140 mg/dL

## 2020-11-01 MED ORDER — INSULIN ASPART 100 UNIT/ML IJ SOLN
0.0000 [IU] | Freq: Three times a day (TID) | INTRAMUSCULAR | Status: DC
  Administered 2020-11-01: 17:00:00 2 [IU] via SUBCUTANEOUS
  Administered 2020-11-01: 12:00:00 1 [IU] via SUBCUTANEOUS
  Administered 2020-11-02: 2 [IU] via SUBCUTANEOUS
  Administered 2020-11-02: 1 [IU] via SUBCUTANEOUS
  Filled 2020-11-01 (×4): qty 1

## 2020-11-01 MED ORDER — INSULIN ASPART 100 UNIT/ML IJ SOLN: 0.0000 [IU] | Freq: Every day | INTRAMUSCULAR | Status: DC

## 2020-11-01 MED ORDER — MORPHINE SULFATE (PF) 2 MG/ML IV SOLN
1.0000 mg | INTRAVENOUS | Status: DC | PRN
  Administered 2020-11-02 – 2020-11-03 (×5): 2 mg via INTRAVENOUS
  Filled 2020-11-01 (×5): qty 1

## 2020-11-01 MED ORDER — DILTIAZEM HCL 25 MG/5ML IV SOLN
5.0000 mg | Freq: Once | INTRAVENOUS | Status: AC
  Administered 2020-11-01: 02:00:00 5 mg via INTRAVENOUS
  Filled 2020-11-01: qty 5

## 2020-11-01 MED ORDER — LABETALOL HCL 5 MG/ML IV SOLN: 20.0000 mg | INTRAVENOUS | Status: DC | PRN

## 2020-11-01 MED ORDER — METOPROLOL TARTRATE 25 MG PO TABS: 25.0000 mg | ORAL_TABLET | Freq: Two times a day (BID) | ORAL | Status: DC

## 2020-11-01 MED ORDER — METOPROLOL TARTRATE 25 MG PO TABS
25.0000 mg | ORAL_TABLET | Freq: Two times a day (BID) | ORAL | Status: DC
  Administered 2020-11-01 – 2020-11-03 (×5): 25 mg via ORAL
  Filled 2020-11-01 (×5): qty 1

## 2020-11-01 MED ORDER — HYDROMORPHONE HCL 1 MG/ML IJ SOLN
0.5000 mg | Freq: Once | INTRAMUSCULAR | Status: AC
Start: 2020-11-01 — End: 2020-11-01
  Administered 2020-11-01: 0.5 mg via INTRAVENOUS
  Filled 2020-11-01: qty 1

## 2020-11-01 NOTE — Care Management Obs Status (Signed)
MEDICARE OBSERVATION STATUS NOTIFICATION   Patient Details  Name: Mark Dougherty MRN: [id number] Date of Birth: 08/06/1926   Medicare Observation Status Notification Given:  Yes    Allayne Butcher, RN 11/01/2020, 10:25 AM

## 2020-11-01 NOTE — NC FL2 (Signed)
[place] MEDICAID [id number] LEVEL OF CARE SCREENING TOOL     IDENTIFICATION  Patient Name: Mark Dougherty Birthdate: 07-25-1926 Sex: male Admission Date (Current Location): 10/31/2020  [facility] and [place] Number:  Chiropodist and Address:  [facility], [address], [place], [place] [address]      Provider Number: [id number]  Attending Physician Name and Address:  Lorin Glass, MD  Relative Name and Phone Number:  Mark Dougherty (wife) [phone]    Current Level of Care: Hospital Recommended Level of Care: Skilled Nursing Facility Prior Approval Number:    Date Approved/Denied:   PASRR Number: [id number]  Discharge Plan: SNF    Current Diagnoses: Patient Active Problem List   Diagnosis Date Noted   Frequent falls 10/31/2020   Knee injury 12/21/2018   Unilateral primary osteoarthritis, left knee 11/28/2017   Unilateral primary osteoarthritis, right knee 11/28/2017   Syncope 11/29/2016   Acute left-sided low back pain without sciatica 11/12/2016   Chronic systolic CHF (congestive heart failure) (HCC)    Ischemic cardiomyopathy    Thyroid disease    Typical atrial flutter (HCC)    Atrial flutter (HCC) 05/31/2015   Neuropathy 05/31/2015   PAF (paroxysmal atrial fibrillation) (HCC) 01/04/2011   Congestive dilated cardiomyopathy (HCC) 09/28/2010   Dehydration 09/14/2010   Diabetes mellitus type 2, controlled, with complications (HCC) 01/09/2010   Hyperlipidemia 01/09/2010   CAD (coronary artery disease) 01/09/2010   Carotid stenosis 01/09/2010    Orientation RESPIRATION BLADDER Height & Weight     Self, Situation, Place  Normal Continent Weight: 88.2 kg Height:  5\' 11"  (180.3 cm)  BEHAVIORAL SYMPTOMS/MOOD NEUROLOGICAL BOWEL NUTRITION STATUS      Continent Diet (see discharge summary)  AMBULATORY STATUS COMMUNICATION OF NEEDS Skin   Extensive Assist Verbally Normal                       Personal Care Assistance Level of  Assistance  Bathing, Feeding, Dressing Bathing Assistance: Maximum assistance Feeding assistance: Limited assistance Dressing Assistance: Maximum assistance     Functional Limitations Info  Sight, Hearing, Speech Sight Info: Adequate Hearing Info: Impaired Speech Info: Adequate    SPECIAL CARE FACTORS FREQUENCY  PT (By licensed PT), OT (By licensed OT)     PT Frequency: 5 times per week OT Frequency: 5 times per week            Contractures Contractures Info: Not present    Additional Factors Info  Code Status, Allergies Code Status Info: Full Allergies Info: NKA           Current Medications (11/01/2020):  This is the current hospital active medication list Current Facility-Administered Medications  Medication Dose Route Frequency Provider Last Rate Last Admin   acetaminophen (TYLENOL) tablet 650 mg  650 mg Oral Q6H PRN Cox, Amy N, DO       Or   acetaminophen (TYLENOL) suppository 650 mg  650 mg Rectal Q6H PRN Cox, Amy N, DO       divalproex (DEPAKOTE) DR tablet 250 mg  250 mg Oral BID Cox, Amy N, DO   250 mg at 11/01/20 1048   enoxaparin (LOVENOX) injection 40 mg  40 mg Subcutaneous Q24H Cox, Amy N, DO       folic acid (FOLVITE) tablet 1 mg  1 mg Oral Daily Cox, Amy N, DO   1 mg at 11/01/20 0754   insulin aspart (novoLOG) injection 0-5 Units  0-5 Units Subcutaneous QHS  Lorin Glass, MD       insulin aspart (novoLOG) injection 0-9 Units  0-9 Units Subcutaneous TID WC Dahal, Melina Schools, MD   1 Units at 11/01/20 1213   labetalol (NORMODYNE) injection 20 mg  20 mg Intravenous Q3H PRN Mansy, Jan A, MD       metFORMIN (GLUCOPHAGE) tablet 500 mg  500 mg Oral BID WC Cox, Amy N, DO   500 mg at 11/01/20 0755   metoprolol tartrate (LOPRESSOR) tablet 25 mg  25 mg Oral BID Lorin Glass, MD   25 mg at 11/01/20 0754   morphine 2 MG/ML injection 1 mg  1 mg Intravenous Q4H PRN Cox, Amy N, DO   1 mg at 10/31/20 2258   ondansetron (ZOFRAN) tablet 4 mg  4 mg Oral Q6H PRN Cox, Amy N, DO        Or   ondansetron (ZOFRAN) injection 4 mg  4 mg Intravenous Q6H PRN Cox, Amy N, DO       oxyCODONE-acetaminophen (PERCOCET/ROXICET) 5-325 MG per tablet 1 tablet  1 tablet Oral Q6H PRN Cox, Amy N, DO       simvastatin (ZOCOR) tablet 20 mg  20 mg Oral QHS Cox, Amy N, DO   20 mg at 10/31/20 2301   terazosin (HYTRIN) capsule 1 mg  1 mg Oral QHS Cox, Amy N, DO         Discharge Medications: Please see discharge summary for a list of discharge medications.  Relevant Imaging Results:  Relevant Lab Results:   Additional Information SS# [id number]  Covid vaccine x2 no booster  Allayne Butcher, RN

## 2020-11-01 NOTE — Evaluation (Addendum)
Occupational Therapy Evaluation Patient Details Name: Mark Dougherty MRN: [id number] DOB: [birth year] Today's Date: 11/01/2020    History of Present Illness Mark Dougherty is a 87 y.o. 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   Clinical Impression   Mark Dougherty presents today with considerable pain (unable to quantify, but states the he "fell hard" and that "I hurt all over"), generalized weakness, limited endurance. He is able to provide date and details of his fall, but believed he was at home. When therapist informed pt he was at hospital, he replied, looking out window "so that's why this didn't look like my back yard!" Mark Dougherty displayed limited ability to move from supine<sit, requiring Max A for LE repositioning. He  required Mod-Max A for maintaining sitting balance, Max A for changing hospital gown, and Min A + VCs for opening food containers and feeding. HR increases from mid-90s to mid-120s w/ bed mobility. Mark Dougherty reports several falls over the previous 6 months (but unable to quantify). Per later discussion with wife, she states that pt is unable to perform basic ADL tasks without assistance from her. She reports he has declined significantly lately, and at this point she is hardly able to get him out of bed. Recommend DC to SNF to assist pt in regaining strength and improving balance and fxl mobility.     Follow Up Recommendations  SNF    Equipment Recommendations  Other (comment) (defer to next venue of care)    Recommendations for Other Services       Precautions / Restrictions Precautions Precautions: Fall Restrictions Weight Bearing Restrictions: No      Mobility Bed Mobility Overal bed mobility:  Needs Assistance Bed Mobility: Rolling;Supine to Sit;Sit to Supine Rolling: Min assist   Supine to sit: Max assist Sit to supine: Max assist        Transfers                 General transfer comment: not attempted    Balance Overall balance assessment: Needs assistance Sitting-balance support: Bilateral upper extremity supported;Feet supported Sitting balance-Leahy Scale: Poor Sitting balance - Comments: pt unable to maintain upright sitting balance w/o Mod-Max A Postural control: Right lateral lean                                 ADL either performed or assessed with clinical judgement   ADL Overall ADL's : Needs assistance/impaired                 Upper Body Dressing : Maximal assistance;Sitting                           Vision Baseline Vision/History: Wears glasses Patient Visual Report: No change from baseline       Perception     Praxis      Pertinent Vitals/Pain Pain Assessment: Faces Faces Pain Scale: Hurts whole lot Pain Location: Pt unable to quantify, but states that he "hurts all over" Pain Intervention(s): Limited activity within patient's tolerance;Repositioned     Hand Dominance     Extremity/Trunk Assessment Upper Extremity Assessment Upper Extremity Assessment: Generalized weakness   Lower Extremity Assessment Lower Extremity Assessment: Generalized weakness  Communication Communication Communication: HOH   Cognition Arousal/Alertness: Lethargic Behavior During Therapy: Flat affect Overall Cognitive Status: History of cognitive impairments - at baseline                                 General Comments: Pt able to provide name and date, is able to recall that he had a fall, but believes he is still at home   General Comments       Exercises Other Exercises Other Exercises: Educ re: role of OT, falls prevention, DC planning   Shoulder Instructions      Home Living  Family/patient expects to be discharged to:: Private residence Living Arrangements: Spouse/significant other Available Help at Discharge: Family;Available PRN/intermittently         Home Layout: One level               Home Equipment: Insurance underwriter - standard   Additional Comments: Pt unable to provide clear information regarding his home set-up and PLOF      Prior Functioning/Environment Level of Independence: Needs assistance  Gait / Transfers Assistance Needed: Pt uses scooter ("Hooverround") for ambulation ADL's / Homemaking Assistance Needed: Pt reports his wife "has to do everything" for him            OT Problem List: Decreased strength;Decreased activity tolerance;Impaired balance (sitting and/or standing);Decreased cognition;Decreased knowledge of use of DME or AE      OT Treatment/Interventions: Self-care/ADL training;Patient/family education;Balance training;DME and/or AE instruction;Therapeutic activities;Therapeutic exercise    OT Goals(Current goals can be found in the care plan section) Acute Rehab OT Goals Patient Stated Goal: to feel better OT Goal Formulation: With patient Time For Goal Achievement: 11/15/20 Potential to Achieve Goals: Good ADL Goals Pt Will Perform Eating: with set-up;with modified independence (Able to open containers, initiate eating, with 2 or less VCs) Pt Will Perform Upper Body Dressing: sitting;with min assist Additional ADL Goal #1: Pt will be able to maintain good sitting balance EOB for 5+ minutes with minA  OT Frequency: Min 1X/week   Barriers to D/C: Decreased caregiver support  Pt's wife reports she is unable to meet his increasing care needs       Co-evaluation              AM-PAC OT "6 Clicks" Daily Activity     Outcome Measure Help from another person eating meals?: A Little Help from another person taking care of personal grooming?: A Lot Help from another person toileting, which includes using  toliet, bedpan, or urinal?: A Lot Help from another person bathing (including washing, rinsing, drying)?: A Lot Help from another person to put on and taking off regular upper body clothing?: A Lot Help from another person to put on and taking off regular lower body clothing?: A Lot 6 Click Score: 13   End of Session Nurse Communication: Other (comment) (Pt cleared for OT)  Activity Tolerance: Patient limited by lethargy Patient left: in bed;with call bell/phone within reach;with nursing/sitter in room;with bed alarm set  OT Visit Diagnosis: Unsteadiness on feet (R26.81);Other abnormalities of gait and mobility (R26.89);Muscle weakness (generalized) (M62.81)                Time: 8099-8338 OT Time Calculation (min): 50 min Charges:  OT General Charges $OT Visit: 1 Visit OT Evaluation $OT Eval Moderate Complexity: 1 Mod OT Treatments $Self Care/Home Management : 38-52 mins Latina Craver, PhD, MS, OTR/L 11/01/20,  3:56 PM

## 2020-11-01 NOTE — Progress Notes (Signed)
Notified MD Dahal that pt is in afib-RVR, HR up to the 150's. Pt's Metoprolol was increased to 25 mg PO two times daily. Per MD, we will monitor the pt and reassess for medication needs in two hours after giving 25 mg of Lopressor PO. Dosage given at 0754.

## 2020-11-01 NOTE — TOC Progression Note (Signed)
Transition of Care [facility]) - Progression Note    Patient Details  Name: Mark Dougherty MRN: [id number] Date of Birth: [birth year]  [facility] CM/SW Contact  Shelbie Hutching, RN Phone Number: 11/01/2020, 3:36 PM  Clinical Narrative:    RNCM met with patient's wife at the bedside this afternoon.  Wife reports that she would like for the patient to go for rehab at [facility].  Wife reports that patient has been to [facility] in the past.  RNCM asked if they had a second choice and wife says no but she also says that she doesn't know what she would do if [facility] does not have a bed.  SNF workup started.     Expected Discharge Plan: [place] Barriers to Discharge: Continued Medical Work up  Expected Discharge Plan and Services Expected Discharge Plan: [place]   Discharge Planning Services: CM Consult Post Acute Care Choice: [place] arrangements for the past 2 months: Single Family Home                 DME Arranged: N/A DME Agency: NA       HH Arranged: RN, PT, OT, Nurse's Aide           Social Determinants of Health (SDOH) Interventions    Readmission Risk Interventions No flowsheet data found.

## 2020-11-01 NOTE — Progress Notes (Signed)
PT Cancellation Note  Patient Details Name: Mark Dougherty MRN: [id number] DOB: [birth year]   Cancelled Treatment:    Reason Eval/Treat Not Completed: Medical issues which prohibited therapy Spoke with nurse early in the day and pt was still having high/uncontrolled heart rate.  Later discussion with her reveals HR has improved but BP has been elevated 200s/100s.  Will likely hold this date and re-try tomorrow to allow for further medical management/appropriateness.    Malachi Pro, DPT 11/01/2020, 12:27 PM

## 2020-11-01 NOTE — TOC Initial Note (Signed)
Transition of Care [facility]) - Initial/Assessment Note    Patient Details  Name: Mark Dougherty MRN: [id number] Date of Birth: Jan 14, 1927  Transition of Care [place]) CM/SW Contact:    Shelbie Hutching, RN Phone Number: 11/01/2020, 10:29 AM  Clinical Narrative:                 Patient placed under observation after falling at home.  Patient's legs gave out on him.  RNCM met with patient at the bedside to review MOON, no family present, attempted to contact wife with no answer.  Patient reports that his legs don't work they are worn out but he gets around in a Hover round at home and can do anything he wants to with his hover round.  Patient can stand to transfer but cannot do any walking distance.  Patient lives with his wife and she provides transportation.  Wife is 51.   Patient reports that he would like to be at home and that his wife will be able to pick him up at discharge.  Patient does agree to home health services at discharge.    Expected Discharge Plan: [place] Barriers to Discharge: Continued Medical Work up   Patient Goals and CMS Choice Patient states their goals for this hospitalization and ongoing recovery are:: Patient would rather be at home than here in the hospital CMS Medicare.gov Compare Post Acute Care list provided to:: Patient Choice offered to / list presented to : Patient  Expected Discharge Plan and Services Expected Discharge Plan: [place]   Discharge Planning Services: CM Consult Post Acute Care Choice: [place] arrangements for the past 2 months: Single Family Home                 DME Arranged: N/A DME Agency: NA       HH Arranged: RN, PT, OT, Nurse's Aide          Prior Living Arrangements/Services Living arrangements for the past 2 months: Single Family Home Lives with:: Spouse Patient language and need for interpreter reviewed:: Yes Do you feel safe going back to the place where you live?: Yes       Need for Family Participation in Patient Care: Yes (Comment) (falls) Care giver support system in place?: Yes (comment) (wife) Current home services: DME (hover round, walker, grab bars, raised toilet seat) Criminal Activity/Legal Involvement Pertinent to Current Situation/Hospitalization: No - Comment as needed  Activities of Daily Living      Permission Sought/Granted Permission sought to share information with : Case Manager, Family Supports Permission granted to share information with : Yes, Verbal Permission Granted  Share Information with NAME: Frederick Peers  Permission granted to share info w AGENCY: home health agency  Permission granted to share info w Relationship: wife     Emotional Assessment Appearance:: Appears stated age Attitude/Demeanor/Rapport: Engaged Affect (typically observed): Frustrated Orientation: : Oriented to Self, Oriented to Place Alcohol / Substance Use: Not Applicable Psych Involvement: No (comment)  Admission diagnosis:  Visual hallucinations [R44.1] Frequent falls [R29.6] Fall, initial encounter [W19.XXXA] Acute pain of left knee [M25.562] Altered mental status, unspecified altered mental status type [R41.82] Patient Active Problem List   Diagnosis Date Noted   Frequent falls 10/31/2020   Knee injury 12/21/2018   Unilateral primary osteoarthritis, left knee 11/28/2017   Unilateral primary osteoarthritis, right knee 11/28/2017   Syncope 11/29/2016   Acute left-sided low back pain without sciatica 01/77/9390   Chronic systolic  CHF (congestive heart failure) (HCC)    Ischemic cardiomyopathy    Thyroid disease    Typical atrial flutter (HCC)    Atrial flutter ([place]) 05/31/2015   Neuropathy 05/31/2015   PAF (paroxysmal atrial fibrillation) ([place]) 01/04/2011   Congestive dilated cardiomyopathy ([place]) 09/28/2010   Dehydration 09/14/2010   Diabetes mellitus type 2, controlled, with complications ([place]) 74/71/8550   Hyperlipidemia 01/09/2010   CAD  (coronary artery disease) 01/09/2010   Carotid stenosis 01/09/2010   PCP:  Albina Billet, MD Pharmacy:   [place], [place] [address] Phone: [phone] Fax: [phone]  [facility] [id number], [place] [address] Phone: [phone] Fax: [place], [address] [place] [address] Phone: [phone] Fax: [phone]     Social Determinants of Health (SDOH) Interventions    Readmission Risk Interventions No flowsheet data found.

## 2020-11-01 NOTE — Progress Notes (Signed)
PT Cancellation Note  Patient Details Name: WILMORE HOLSOMBACK MRN: [id number] DOB: [birth year]   Cancelled Treatment:    Reason Eval/Treat Not Completed: Patient declined, no reason specified Pt confused t/o the session; but alert enough to refuse PT after just some very initial LE movement/testing (very pain limited on R, did not even allow movement on the R) and attempts to encourage a transition to sitting.  Spoke a lot with wife about how difficult it has been to manage at home recently, has had increased buckling/difficulty with transfers as well as multiple falls.  He apparently has not walked in months and though he transfers from lift chair to scooter w/o much assist this as well as bathroom transfers, etc are becoming more and more difficult and unsafe.  Wife has not looked much into outside assist/aides or a transition to nursing facility.  She seems to be anxious about her ability to continue on current trajectory, will likely need considerable assist moving forward.    Malachi Pro, DPT 11/01/2020, 3:31 PM

## 2020-11-01 NOTE — Progress Notes (Signed)
Notified Mark Dougherty of pt BP of 216/170, New orders placed. Currently awaiting verification of medication. Recheck pt BP at 0110 BP now 140/82 pulse remains irregular. Asked MD, Andrez Grime if he still wishes Cardizem to be given. Will proceed with administration verification once verified given pt's irregular pulse/ Afib.

## 2020-11-01 NOTE — Progress Notes (Signed)
Initial Nutrition Assessment  DOCUMENTATION CODES:  Not applicable  INTERVENTION:  Liberalize diet to regular due to reported poor intake and weight loss PTA Magic cup TID with meals, each supplement provides 290 kcal and 9 grams of protein  NUTRITION DIAGNOSIS:  Unintentional weight loss related to poor appetite as evidenced by per patient/family report, percent weight loss (4% x 6 months).  GOAL:  Patient will meet greater than or equal to 90% of their needs  MONITOR:  PO intake, Supplement acceptance, Weight trends  REASON FOR ASSESSMENT:  Consult Assessment of nutrition requirement/status  ASSESSMENT:  87 y.o. male with history of atrial fibrillation, DM, HLD, CAD (s/p CABG), and CHF presented to ED after nonsyncopal fall at home the day prior. Reports unable to bear weight since that time and is having some SOB. Lives at home with his wife at baseline  Pt and wife meeting with MD at the time of visit. Viewed chart, no intake recorded this admission. Liberalized diet to allow for greater meal choices. Inquired about intake from nursing staff, states pt had good intake of lunch, consumed almost entire tray.  Some minor weight loss (4% x 6 months) is noted in hx but current weight is healthy for age. Will add nutrition supplement to encourage adequate intake and discourage further weight loss.   Nutritionally Relevant Medications: Scheduled Meds:  folic acid  1 mg Oral Daily   insulin aspart  0-5 Units Subcutaneous QHS   insulin aspart  0-9 Units Subcutaneous TID WC   metFORMIN  500 mg Oral BID WC   simvastatin  20 mg Oral QHS   PRN Meds: ondansetron  Labs Reviewed: Na 132 BUN 28, Creatinine 1.27 SBG ranges from 124-150 mg/dL over the last 24 hours HgbA1c 7.0% (12/22/18)  NUTRITION - FOCUSED PHYSICAL EXAM: Defer to in-person assessment  Diet Order:   Diet Order             Diet regular Room service appropriate? Yes; Fluid consistency: Thin  Diet effective now                    EDUCATION NEEDS:  No education needs have been identified at this time  Skin:  Skin Assessment: Reviewed RN Assessment  Last BM:  PTA  Height:  Ht Readings from Last 1 Encounters:  10/31/20 5\' 11"  (1.803 m)    Weight:  Wt Readings from Last 1 Encounters:  10/31/20 88.2 kg    Ideal Body Weight:  78.2 kg  BMI:  Body mass index is 27.12 kg/m.  Estimated Nutritional Needs:  Kcal:  1800-2000 kcal/d Protein:  90-100 g/d Fluid:  >1.8L/d   01/01/21, RD, LDN Clinical Dietitian Pager on Amion

## 2020-11-01 NOTE — Progress Notes (Signed)
PROGRESS NOTE  Mark Dougherty  DOB: June 06, 1926  PCP: [name], MD HKV:[id number]  DOA: 10/31/2020  LOS: 0 days  Hospital Day: 2   Chief Complaint  Patient presents with   Fall    Knee pain, fall yesterday    Brief narrative: Mark Dougherty is a 87 y.o. male with PMH significant for DM2, HLD, paroxysmal A. fib, CAD/CABG, CKD 3a, chronic pain, BPH. Patient was brought to the ED from home on 7/4 with complaint of fall and visual/auditory hallucinations  1 day prior to presentation, patient had a nonsyncopal fall at home after which he started having knee pain.  His wife was able to get him up and back into his motorized chair.  He continued to have pain at weightbearing.  At baseline, he is only able to bear weight on the left and since the fall he has been very unsteady when transitioning from chair to bed or wheelchair.  In the ED, patient had a heart rate of 105, blood pressure 145/78. Labs in the ED showed WC count 9.4, hemoglobin 12.6, platelet 254, CK was 263, troponin 23, TSH normal Urine negative for leukocytes and nitrates. Patient was admitted to hospital service for further evaluation management  Subjective: Patient was seen and examined this morning.  Pleasant elderly Caucasian male.  Lying on bed.  Not in distress.  Feels weak.  Alert awake, oriented x3.  Assessment/Plan: Weakness/fall Visual and auditory hallucinations -Probably related to progressive dementia. -MRI brain did not show any acute intracranial abnormality and showed findings of chronic microvascular ischemia and generalized volume loss. -Ammonia level normal, TSH level normal -Pending B12 level. -Home meds include Depakote 125 mg twice daily.  Apparently he ran out of these medications few days ago which could have contributed to respirations of hallucinations.  Depakote resumed now.   -Mental status intact.  Physically weak.  Pending PT/OT eval.  A. fib with RVR -History of paroxysmal A. fib  controlled on metoprolol 12.5 mg twice daily.  Not on anticoagulation due to history of falls -Last 24 hours, patient's heart rate has been trending up to 150s.  He was given a dose of Cardizem 5 mg IV once last night.  This morning, will increase the dose of metoprolol to 25 mg twice daily and monitor. -Continue monitoring telemetry  Essential hypertension -Continue metoprolol and terazosin. -Continue monitor blood pressure  Type 2 diabetes mellitus -A1c 7 in 2020 -Home meds include metformin 500 mg twice daily -Currently on the same.  Continue sliding scale insulin with Accu-Cheks. No results for input(s): GLUCAP in the last 168 hours.  CAD/CABG Hyperlipidemia -Not on antiplatelet or anticoagulant.  Continue simvastatin.  Abnormal chest x-ray -chest x-ray showed developing multifocal patchy infiltrates.  However patient does not have any fever or white count.  Procalcitonin level normal as well. -Currently being monitored off antibiotics. Recent Labs  Lab 10/31/20 1157 10/31/20 2002 11/01/20 0510  WBC 9.4  --  9.9  PROCALCITON  --  0.10 0.14   BPH -Finasteride 5 mg daily  Unintentional weight loss -Probably due to poor appetite -Dietitian consulted  Mobility: PT eval Code Status:   Code Status: Full Code  Nutritional status: Body mass index is 27.12 kg/m.     Diet:  Diet Order             Diet Heart Room service appropriate? Yes; Fluid consistency: Thin  Diet effective now  DVT prophylaxis:  enoxaparin (LOVENOX) injection 40 mg Start: 10/31/20 1900 Place TED hose Start: 10/31/20 1858   Antimicrobials: None Fluid: None Consultants: None Family Communication: None at bedside  Status is: Inpatient  Remains inpatient appropriate because: May need placement  Dispo: The patient is from: Home              Anticipated d/c is to: Pending PT eval.  May need placement              Patient currently is not medically stable to d/c.    Difficult to place patient No     Infusions:    Scheduled Meds:  divalproex  250 mg Oral BID   enoxaparin (LOVENOX) injection  40 mg Subcutaneous Q24H   folic acid  1 mg Oral Daily   insulin aspart  0-5 Units Subcutaneous QHS   insulin aspart  0-9 Units Subcutaneous TID WC   metFORMIN  500 mg Oral BID WC   metoprolol tartrate  25 mg Oral BID   simvastatin  20 mg Oral QHS   terazosin  1 mg Oral QHS    Antimicrobials: Anti-infectives (From admission, onward)    None       PRN meds: acetaminophen **OR** acetaminophen, labetalol, morphine injection, ondansetron **OR** ondansetron (ZOFRAN) IV, oxyCODONE-acetaminophen   Objective: Vitals:   11/01/20 0804 11/01/20 0858  BP: 113/84   Pulse: (!) 134   Resp: 15 19  Temp: 98 F (36.7 C)   SpO2:     No intake or output data in the 24 hours ending 11/01/20 1025 Filed Weights   10/31/20 2358  Weight: 88.2 kg   Weight change:  Body mass index is 27.12 kg/m.   Physical Exam: General exam: Pleasant, elderly Caucasian male.  Weak.  Not in distress Skin: No rashes, lesions or ulcers. HEENT: Atraumatic, normocephalic, no obvious bleeding Lungs: Clear to auscultation bilaterally CVS: Regular rate and rhythm, no murmur GI/Abd soft, nontender, nondistended, bowel sound present CNS: Alert, awake, slow to respond but oriented to place person and time Psychiatry: Mood appropriate Extremities: No pedal edema, no calf tenderness  Data Review: I have personally reviewed the laboratory data and studies available.  Recent Labs  Lab 10/31/20 1157 11/01/20 0510  WBC 9.4 9.9  NEUTROABS 4.4  --   HGB 12.6* 11.3*  HCT 38.1* 33.5*  MCV 94.5 92.5  PLT 254 225   Recent Labs  Lab 10/31/20 1157 11/01/20 0510  NA 132* 132*  K 4.6 4.3  CL 97* 99  CO2 22 28  GLUCOSE 150* 124*  BUN 25* 28*  CREATININE 1.31* 1.27*  CALCIUM 9.1 8.7*    F/u labs ordered Unresulted Labs (From admission, onward)     Start     Ordered    11/02/20 0500  CBC with Differential/Platelet  Daily,   R      11/01/20 0828   11/02/20 0500  Basic metabolic panel  Daily,   R      11/01/20 0828   11/02/20 0500  Magnesium  Tomorrow morning,   R        11/01/20 0828   11/02/20 0500  Phosphorus  Tomorrow morning,   R        11/01/20 0828   11/01/20 0500  Procalcitonin  Daily,   STAT      10/31/20 1920   10/31/20 1900  Vitamin B12  Add-on,   AD        10/31/20 1859   Unscheduled  Hemoglobin  A1c  Add-on,   R       Comments: To assess prior glycemic control    11/01/20 1025            Signed, Lorin Glass, MD [facility] 11/01/2020

## 2020-11-02 LAB — CBC WITH DIFFERENTIAL/PLATELET
Abs Immature Granulocytes: 0.08 10*3/uL — ABNORMAL HIGH (ref 0.00–0.07)
Basophils Absolute: 0 10*3/uL (ref 0.0–0.1)
Basophils Relative: 0 %
Eosinophils Absolute: 0 10*3/uL (ref 0.0–0.5)
Eosinophils Relative: 0 %
HCT: 33.5 % — ABNORMAL LOW (ref 39.0–52.0)
Hemoglobin: 11.1 g/dL — ABNORMAL LOW (ref 13.0–17.0)
Immature Granulocytes: 1 %
Lymphocytes Relative: 34 %
Lymphs Abs: 3.1 10*3/uL (ref 0.7–4.0)
MCH: 30.7 pg (ref 26.0–34.0)
MCHC: 33.1 g/dL (ref 30.0–36.0)
MCV: 92.5 fL (ref 80.0–100.0)
Monocytes Absolute: 1.1 10*3/uL — ABNORMAL HIGH (ref 0.1–1.0)
Monocytes Relative: 12 %
Neutro Abs: 5 10*3/uL (ref 1.7–7.7)
Neutrophils Relative %: 53 %
Platelets: 238 10*3/uL (ref 150–400)
RBC: 3.62 MIL/uL — ABNORMAL LOW (ref 4.22–5.81)
RDW: 12.9 % (ref 11.5–15.5)
WBC: 9.3 10*3/uL (ref 4.0–10.5)
nRBC: 0 % (ref 0.0–0.2)

## 2020-11-02 LAB — GLUCOSE, CAPILLARY
Glucose-Capillary: 110 mg/dL — ABNORMAL HIGH (ref 70–99)
Glucose-Capillary: 125 mg/dL — ABNORMAL HIGH (ref 70–99)
Glucose-Capillary: 160 mg/dL — ABNORMAL HIGH (ref 70–99)
Glucose-Capillary: 127 mg/dL — ABNORMAL HIGH (ref 70–99)

## 2020-11-02 LAB — BASIC METABOLIC PANEL
Anion gap: 7 (ref 5–15)
BUN: 36 mg/dL — ABNORMAL HIGH (ref 8–23)
CO2: 27 mmol/L (ref 22–32)
Calcium: 8.5 mg/dL — ABNORMAL LOW (ref 8.9–10.3)
Chloride: 100 mmol/L (ref 98–111)
Creatinine, Ser: 1.41 mg/dL — ABNORMAL HIGH (ref 0.61–1.24)
GFR, Estimated: 47 mL/min — ABNORMAL LOW (ref >60)
Glucose, Bld: 116 mg/dL — ABNORMAL HIGH (ref 70–99)
Potassium: 4.5 mmol/L (ref 3.5–5.1)
Sodium: 134 mmol/L — ABNORMAL LOW (ref 135–145)

## 2020-11-02 LAB — PHOSPHORUS: Phosphorus: 3.6 mg/dL (ref 2.5–4.6)

## 2020-11-02 LAB — MAGNESIUM: Magnesium: 1.9 mg/dL (ref 1.7–2.4)

## 2020-11-02 LAB — PROCALCITONIN: Procalcitonin: 0.24 ng/mL

## 2020-11-02 MED ORDER — SODIUM CHLORIDE 0.9 % IV SOLN: INTRAVENOUS | Status: DC

## 2020-11-02 NOTE — Evaluation (Signed)
Physical Therapy Evaluation Patient Details Name: Mark Dougherty MRN: [id number] DOB: [birth year] Today's Date: 11/02/2020   History of Present Illness  Mark Dougherty is a 87 y.o. 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 MD assessment includes weakness/fall, visual/auditory hallucinations, a. fib with RVR, abnormal chest x-ray, and unintentional weight loss.  Clinical Impression  Pt was willing to participate during the session. Pt had difficulty providing information regarding pt history and PLOF and reports that wife knows all that information. Pt required mod-max assist for bed mobility. Pt unable to maintain upright posture on EOB without constant mod-max assistance with a tendency to lean to the right making it unsafe to attempt OOB activities. Blood noted on chuck pad secondary to skin impairment on back so nurse was notified. Pt repositioned in bed in side-lying on left side to alleviate back pain. Pt very limited by pain and generalized weakness despite being premedicated for pain prior to session. Pt will benefit from PT services in a SNF setting upon discharge to safely address deficits listed in patient problem list for decreased caregiver assistance and eventual return to PLOF.     Follow Up Recommendations SNF    Equipment Recommendations  3in1 (PT)    Recommendations for Other Services       Precautions / Restrictions Precautions Precautions: Fall Restrictions Weight Bearing Restrictions: No      Mobility  Bed Mobility Overal bed mobility: Needs Assistance Bed Mobility: Rolling;Supine to Sit;Sit to Supine Rolling: Mod assist   Supine to sit: Max assist Sit to supine: Max assist   General bed mobility  comments: Pt moved BLEs close to EOB then required max assist for trunk control and LEs.    Transfers                 General transfer comment: NT, unable to maintain upright posture on EOB with mod-max A  Ambulation/Gait             General Gait Details: NT  Stairs            Wheelchair Mobility    Modified Rankin (Stroke Patients Only)       Balance Overall balance assessment: Needs assistance Sitting-balance support: Bilateral upper extremity supported;Feet supported Sitting balance-Leahy Scale: Poor Sitting balance - Comments: pt unable to maintain upright sitting balance w/o Mod-Max A Postural control: Right lateral lean     Standing balance comment: NT                             Pertinent Vitals/Pain Pain Assessment: 0-10 Faces Pain Scale: Hurts whole lot Pain Location: Pt unable to quantify, but reports chronic pain in back and left knee pain from fall. Pain Descriptors / Indicators: Constant;Sore;Aching Pain Intervention(s): Limited activity within patient's tolerance;Monitored during session;Premedicated before session    Home Living Family/patient expects to be discharged to:: Private residence Living Arrangements: Spouse/significant other Available Help at Discharge: Family;Available PRN/intermittently Type of Home: House       Home Layout: One level Home Equipment: Insurance underwriter - standard Additional Comments: Most hx information per OT note. Pt unable to provide clear information regarding his home set-up and PLOF.    Prior Function Level of Independence: Needs assistance   Gait / Transfers Assistance  Needed: Pt reports that he doesn't walk. Pt uses scooter ("Hooverround") for ambulation. Pt reports he performs transfers without AD.  ADL's / Homemaking Assistance Needed: Needs assistance from wife.  Comments: Pt reports that he is unable to use right leg but not sure why it is so weak.     Hand Dominance         Extremity/Trunk Assessment   Upper Extremity Assessment Upper Extremity Assessment: Generalized weakness    Lower Extremity Assessment Lower Extremity Assessment: Generalized weakness;RLE deficits/detail;LLE deficits/detail RLE Deficits / Details: Pt reports poor function of right leg for several a couple years but not sure why it is so weak. RLE: Unable to fully assess due to pain LLE Deficits / Details: Limited secondary to L knee pain from fall LLE: Unable to fully assess due to pain       Communication   Communication: HOH  Cognition Arousal/Alertness: Awake/alert Behavior During Therapy: Flat affect;Agitated Overall Cognitive Status: History of cognitive impairments - at baseline                                 General Comments: Pt reported difficulty providing rembering information in regards to history questions and that his wife would be the one to ask.      General Comments      Exercises Total Joint Exercises Ankle Circles/Pumps: AROM;Both;10 reps;Supine Long Arc Quad: AROM;Both;5 reps;Seated Other Exercises Other Exercises: Static sitting 2-3 min for improved trunk control and balance.   Assessment/Plan    PT Assessment Patient needs continued PT services  PT Problem List Decreased strength;Decreased activity tolerance;Decreased balance;Decreased mobility;Decreased knowledge of use of DME;Decreased safety awareness;Pain       PT Treatment Interventions DME instruction;Functional mobility training;Therapeutic activities;Therapeutic exercise;Balance training;Patient/family education    PT Goals (Current goals can be found in the Care Plan section)  Acute Rehab PT Goals Patient Stated Goal: Get back home PT Goal Formulation: With patient Time For Goal Achievement: 11/15/20 Potential to Achieve Goals: Poor    Frequency Min 2X/week   Barriers to discharge        Co-evaluation               AM-PAC PT "6 Clicks" Mobility   Outcome Measure Help needed turning from your back to your side while in a flat bed without using bedrails?: A Lot Help needed moving from lying on your back to sitting on the side of a flat bed without using bedrails?: A Lot Help needed moving to and from a bed to a chair (including a wheelchair)?: Total Help needed standing up from a chair using your arms (e.g., wheelchair or bedside chair)?: A Lot Help needed to walk in hospital room?: Total Help needed climbing 3-5 steps with a railing? : Total 6 Click Score: 9    End of Session   Activity Tolerance: Patient limited by pain Patient left: in bed;with call bell/phone within reach;with bed alarm set Nurse Communication: Mobility status (Notified of wound on back) PT Visit Diagnosis: Muscle weakness (generalized) (M62.81);History of falling (Z91.81);Difficulty in walking, not elsewhere classified (R26.2);Pain Pain - Right/Left: Left Pain - part of body: Knee    Time: 9629-5284 PT Time Calculation (min) (ACUTE ONLY): 30 min   Charges:              Desiree Hane SPT 11/02/20, 2:15 PM

## 2020-11-02 NOTE — TOC Progression Note (Signed)
Transition of Care [facility]) - Progression Note    Patient Details  Name: Mark Dougherty MRN: [id number] Date of Birth: 1926/08/08  [facility]) CM/SW Contact  Allayne Butcher, RN Phone Number: 11/02/2020, 4:00 PM  Clinical Narrative:    Patient's wife agrees to SNF as recommended by PT and OT.  [facility] does not have a bed so wife chooses [facility].  Gavin Pound from [facility] reports they should be able to accept tomorrow.    Expected Discharge Plan: Skilled Nursing Facility Barriers to Discharge: Continued Medical Work up  Expected Discharge Plan and Services Expected Discharge Plan: Skilled Nursing Facility   Discharge Planning Services: CM Consult Post Acute Care Choice: Home Health Living arrangements for the past 2 months: Single Family Home                 DME Arranged: N/A DME Agency: NA       HH Arranged: RN, PT, OT, Nurse's Aide           Social Determinants of Health (SDOH) Interventions    Readmission Risk Interventions No flowsheet data found.

## 2020-11-02 NOTE — Progress Notes (Signed)
Pt keeps expressing that he wants to die to wife and healthcare staff. Pt's wife requesting for pt to be a DNR, she will like information about palliative care. MD Dahal made aware, DNR order in place. Per MD, he spoke to pt's wife to confirm DNR. Will continue to monitor.

## 2020-11-02 NOTE — Progress Notes (Signed)
PROGRESS NOTE  Mark Dougherty  DOB: 09/28/1926  PCP: [name], MD BDZ:[id number]  DOA: 10/31/2020  LOS: 1 day  Hospital Day: 3   Chief Complaint  Patient presents with   Fall    Knee pain, fall yesterday    Brief narrative: Mark Dougherty is a 87 y.o. male with PMH significant for DM2, HLD, paroxysmal A. fib, CAD/CABG, CKD 3a, chronic pain, BPH. Patient was brought to the ED from home on 7/4 with complaint of fall and visual/auditory hallucinations  1 day prior to presentation, patient had a nonsyncopal fall at home after which he started having knee pain.  His wife was able to get him up and back into his motorized chair.  He continued to have pain at weightbearing.  At baseline, he is only able to bear weight on the left and since the fall he has been very unsteady when transitioning from chair to bed or wheelchair.  In the ED, patient had a heart rate of 105, blood pressure 145/78. Labs in the ED showed WBC count 9.4, hemoglobin 12.6, platelet 254, CK was 263, troponin 23, TSH normal Urine negative for leukocytes and nitrates. Patient was admitted to hospital service for further evaluation management. Pending placement  Subjective: Patient was seen and examined this morning.   Propped up in bed.  Sleeping, wakes up on verbal command, answers questions appropriately.  Not in distress.  Looks clinically dehydrated.    Assessment/Plan: Weakness/fall Visual and auditory hallucinations -Probably related to progressive dementia. -MRI brain did not show any acute intracranial abnormality and showed findings of chronic microvascular ischemia and generalized volume loss. -Ammonia level normal, TSH level normal -Home meds include Depakote 125 mg twice daily.  Apparently he ran out of these medications few days ago which could have contributed to respirations of hallucinations.  Depakote has been resumed this hospitalization  -Mental status intact.  Physically weak.  PT eval  obtained.  SNF recommended.  Family agrees with the recommendation.  A. fib with RVR -History of paroxysmal A. fib controlled on metoprolol 12.5 mg twice daily.  Not on anticoagulation due to history of falls -On the night of admission, patient was in RVR up to 150s.  Given a dose of Cardizem 5 mg IV once.  Next morning increase metoprolol to 25 mg twice daily after which his heart rate has remained stable.   -Continue monitoring telemetry  CKD3a -Creatinine trend as below.  -Looks clinically dehydrated.  Start on NS at 75.  Continue to monitor renal function Recent Labs    01/26/20 0800 05/02/20 0003 10/31/20 1157 11/01/20 0510 11/02/20 0640  BUN 28* 33* 25* 28* 36*  CREATININE 1.37* 1.25* 1.31* 1.27* 1.41*   Essential hypertension -Continue metoprolol and terazosin. -Continue monitor blood pressure  Type 2 diabetes mellitus -A1c 7 in 2020 -Home meds include metformin 500 mg twice daily.  We will keep it on hold at this time because of elevated creatinine. -Continue sliding scale insulin with Accu-Cheks. Recent Labs  Lab 11/01/20 1207 11/01/20 1615 11/01/20 2130 11/02/20 0850  GLUCAP 135* 155* 118* 110*    CAD/CABG Hyperlipidemia -Not on antiplatelet or anticoagulant.  Continue simvastatin.  Abnormal chest x-ray -chest x-ray on admission showed developing multifocal patchy infiltrates.  However patient does not have any fever or white count.  Procalcitonin level was initially normal but trending up.  He does not have any respiratory symptoms. -Continue to monitor off antibiotics. Recent Labs  Lab 10/31/20 1157 10/31/20 2002 11/01/20 0510  11/02/20 0640  WBC 9.4  --  9.9 9.3  PROCALCITON  --  0.10 0.14 0.24    BPH -Finasteride 5 mg daily  Unintentional weight loss -Probably due to poor appetite -Dietitian consulted  Mobility: PT eval obtained Code Status:   Code Status: Full Code  Nutritional status: Body mass index is 27.12 kg/m. Nutrition Problem:  Unintentional weight loss Etiology: poor appetite Signs/Symptoms: per patient/family report, percent weight loss (4% x 6 months) Percent weight loss: 4 % Diet:  Diet Order             Diet regular Room service appropriate? Yes; Fluid consistency: Thin  Diet effective now                  DVT prophylaxis:  enoxaparin (LOVENOX) injection 40 mg Start: 10/31/20 1900 Place TED hose Start: 10/31/20 1858   Antimicrobials: None Fluid: Normal saline at 75 mill per hour Consultants: None Family Communication: Discussed with patient's wife on 7/5  Status is: Inpatient  Remains inpatient appropriate because: Pending SNF  Dispo: The patient is from: Home              Anticipated d/c is to: Pending SNF              Patient currently is medically stable to d/c.   Difficult to place patient No     Infusions:   sodium chloride 75 mL/hr at 11/02/20 0846    Scheduled Meds:  divalproex  250 mg Oral BID   enoxaparin (LOVENOX) injection  40 mg Subcutaneous Q24H   folic acid  1 mg Oral Daily   insulin aspart  0-5 Units Subcutaneous QHS   insulin aspart  0-9 Units Subcutaneous TID WC   metoprolol tartrate  25 mg Oral BID   simvastatin  20 mg Oral QHS   terazosin  1 mg Oral QHS    Antimicrobials: Anti-infectives (From admission, onward)    None       PRN meds: acetaminophen **OR** acetaminophen, labetalol, morphine injection, ondansetron **OR** ondansetron (ZOFRAN) IV   Objective: Vitals:   11/01/20 2348 11/02/20 0738  BP: 129/62 (!) 130/101  Pulse: 85 80  Resp: 17 18  Temp:  99 F (37.2 C)  SpO2: 96% 97%    Intake/Output Summary (Last 24 hours) at 11/02/2020 1009 Last data filed at 11/01/2020 2152 Gross per 24 hour  Intake 0 ml  Output 100 ml  Net -100 ml   Filed Weights   10/31/20 2358  Weight: 88.2 kg   Weight change:  Body mass index is 27.12 kg/m.   Physical Exam: General exam: Pleasant, elderly Caucasian male.  Weak.  Not in distress.  Clinically  looks dry Skin: No rashes, lesions or ulcers. HEENT: Atraumatic, normocephalic, no obvious bleeding Lungs: Clear to auscultation bilaterally CVS: Regular rate and rhythm, no murmur GI/Abd soft, nontender, nondistended, bowel sound present CNS: Sleeping, opens eyes on verbal command.  Appropriately answers questions. Psychiatry: Mood appropriate Extremities: No pedal edema, no calf tenderness  Data Review: I have personally reviewed the laboratory data and studies available.  Recent Labs  Lab 10/31/20 1157 11/01/20 0510 11/02/20 0640  WBC 9.4 9.9 9.3  NEUTROABS 4.4  --  5.0  HGB 12.6* 11.3* 11.1*  HCT 38.1* 33.5* 33.5*  MCV 94.5 92.5 92.5  PLT 254 225 238    Recent Labs  Lab 10/31/20 1157 11/01/20 0510 11/02/20 0640  NA 132* 132* 134*  K 4.6 4.3 4.5  CL 97* 99 100  CO2 22 28 27   GLUCOSE 150* 124* 116*  BUN 25* 28* 36*  CREATININE 1.31* 1.27* 1.41*  CALCIUM 9.1 8.7* 8.5*  MG  --   --  1.9  PHOS  --   --  3.6     F/u labs ordered Unresulted Labs (From admission, onward)     Start     Ordered   11/02/20 0500  CBC with Differential/Platelet  Daily,   R      11/01/20 0828   11/02/20 0500  Basic metabolic panel  Daily,   R      11/01/20 0828            Signed, 01/02/21, MD [facility] 11/02/2020

## 2020-11-02 NOTE — Progress Notes (Signed)
Occupational Therapy Treatment Patient Details Name: Mark Dougherty MRN: [id number] DOB: 02-13-1927 Today's Date: 11/02/2020    History of present illness Mark Dougherty is a 87 y.o. 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 MD assessment includes weakness/fall, visual/auditory hallucinations, a. fib with RVR, abnormal chest x-ray, and unintentional weight loss.   OT comments  Upon entering the room, pt supine in bed and sleeping soundly. OT attempts to set pt up in bed with use of bed feature and pt crying out in pain and begins having muscle spasms. RN notified for pain medication. Pt needing hand over hand assistance to take several bites of magic cup. Minimal spillage noted. Pt verbalized he was in hospital and that his birthday was [birth year] (which is incorrect). Pt reports UEs are itching and needing hand over hand assistance to apply lotion to B arms. Pt asking for assistance twice this session with urinal and reports urgency but unable to void this session. Pt remains in bed and requesting to rest at this time. No family present during this session. Bed alarm activated and call bell within reach.   Follow Up Recommendations  SNF    Equipment Recommendations  Other (comment) (defer to next venue of care)       Precautions / Restrictions Precautions Precautions: Fall Restrictions Weight Bearing Restrictions: No              ADL either performed or assessed with clinical judgement   ADL   Eating/Feeding: Maximal assistance;Total assistance;Bed level   Grooming: Wash/dry hands;Bed level;Total assistance                                       Vision Patient Visual Report: No change from  baseline            Cognition Arousal/Alertness: Awake/alert Behavior During Therapy: Flat affect;Agitated Overall Cognitive Status: History of cognitive impairments - at baseline                                 General Comments: Pt does report he is in the hospital and knows correct month. Reports wrong DOB                   Pertinent Vitals/ Pain       Pain Assessment: Faces Faces Pain Scale: Hurts worst Pain Location: back pain Pain Descriptors / Indicators: Constant;Sore;Aching Pain Intervention(s): Limited activity within patient's tolerance;Monitored during session;Premedicated before session;Repositioned;Patient requesting pain meds-RN notified  Home Living Family/patient expects to be discharged to:: Private residence Living Arrangements: Spouse/significant other Available Help at Discharge: Family;Available PRN/intermittently Type of Home: House       Home Layout: One level               Home Equipment: Insurance underwriter - standard   Additional Comments: Most hx information per OT note. Pt unable to provide clear information regarding his home set-up and PLOF.      Prior Functioning/Environment Level of Independence: Needs assistance  Gait / Transfers Assistance Needed: Pt reports that he doesn't walk. Pt uses scooter ("Hooverround") for ambulation. Pt reports he performs transfers without AD. ADL's /  Homemaking Assistance Needed: Needs assistance from wife.   Comments: Pt reports that he is unable to use right leg but not sure why it is so weak.   Frequency  Min 1X/week        Progress Toward Goals  OT Goals(current goals can now be found in the care plan section)  Progress towards OT goals: Progressing toward goals  Acute Rehab OT Goals Patient Stated Goal: Get back home OT Goal Formulation: With patient Time For Goal Achievement: 11/15/20 Potential to Achieve Goals: Good  Plan Discharge plan remains appropriate        AM-PAC OT "6 Clicks" Daily Activity     Outcome Measure   Help from another person eating meals?: A Lot Help from another person taking care of personal grooming?: A Lot Help from another person toileting, which includes using toliet, bedpan, or urinal?: Total Help from another person bathing (including washing, rinsing, drying)?: Total Help from another person to put on and taking off regular upper body clothing?: A Lot Help from another person to put on and taking off regular lower body clothing?: Total 6 Click Score: 9    End of Session    OT Visit Diagnosis: Unsteadiness on feet (R26.81);Other abnormalities of gait and mobility (R26.89);Muscle weakness (generalized) (M62.81)   Activity Tolerance Patient limited by pain   Patient Left in bed;with call bell/phone within reach;with bed alarm set   Nurse Communication Patient requests pain meds        Time: 2376-2831 OT Time Calculation (min): 24 min  Charges: OT General Charges $OT Visit: 1 Visit OT Treatments $Self Care/Home Management : 23-37 mins  Jackquline Denmark, MS, OTR/L , CBIS ascom [phone]  11/02/20, 3:14 PM

## 2020-11-03 LAB — BASIC METABOLIC PANEL
Anion gap: 8 (ref 5–15)
BUN: 36 mg/dL — ABNORMAL HIGH (ref 8–23)
CO2: 26 mmol/L (ref 22–32)
Calcium: 8.5 mg/dL — ABNORMAL LOW (ref 8.9–10.3)
Chloride: 102 mmol/L (ref 98–111)
Creatinine, Ser: 1.32 mg/dL — ABNORMAL HIGH (ref 0.61–1.24)
GFR, Estimated: 51 mL/min — ABNORMAL LOW (ref >60)
Glucose, Bld: 117 mg/dL — ABNORMAL HIGH (ref 70–99)
Potassium: 4.2 mmol/L (ref 3.5–5.1)
Sodium: 136 mmol/L (ref 135–145)

## 2020-11-03 LAB — CBC WITH DIFFERENTIAL/PLATELET
Abs Immature Granulocytes: 0.05 10*3/uL (ref 0.00–0.07)
Basophils Absolute: 0 10*3/uL (ref 0.0–0.1)
Basophils Relative: 0 %
Eosinophils Absolute: 0 10*3/uL (ref 0.0–0.5)
Eosinophils Relative: 1 %
HCT: 32.3 % — ABNORMAL LOW (ref 39.0–52.0)
Hemoglobin: 10.7 g/dL — ABNORMAL LOW (ref 13.0–17.0)
Immature Granulocytes: 1 %
Lymphocytes Relative: 32 %
Lymphs Abs: 2.8 10*3/uL (ref 0.7–4.0)
MCH: 30.9 pg (ref 26.0–34.0)
MCHC: 33.1 g/dL (ref 30.0–36.0)
MCV: 93.4 fL (ref 80.0–100.0)
Monocytes Absolute: 1.2 10*3/uL — ABNORMAL HIGH (ref 0.1–1.0)
Monocytes Relative: 14 %
Neutro Abs: 4.6 10*3/uL (ref 1.7–7.7)
Neutrophils Relative %: 52 %
Platelets: 241 10*3/uL (ref 150–400)
RBC: 3.46 MIL/uL — ABNORMAL LOW (ref 4.22–5.81)
RDW: 12.8 % (ref 11.5–15.5)
WBC: 8.7 10*3/uL (ref 4.0–10.5)
nRBC: 0 % (ref 0.0–0.2)

## 2020-11-03 LAB — GLUCOSE, CAPILLARY
Glucose-Capillary: 103 mg/dL — ABNORMAL HIGH (ref 70–99)
Glucose-Capillary: 115 mg/dL — ABNORMAL HIGH (ref 70–99)

## 2020-11-03 LAB — RESP PANEL BY RT-PCR (FLU A&B, COVID) ARPGX2
Influenza A by PCR: NEGATIVE
Influenza B by PCR: NEGATIVE
SARS Coronavirus 2 by RT PCR: NEGATIVE

## 2020-11-03 MED ORDER — METOPROLOL TARTRATE 25 MG PO TABS: 25.0000 mg | ORAL_TABLET | Freq: Two times a day (BID) | ORAL | 3 refills | Status: AC

## 2020-11-03 NOTE — Discharge Summary (Addendum)
Physician Discharge Summary  Mark Dougherty HQI:[id number] DOB: 08-26-1926 DOA: 10/31/2020  PCP: [name], MD  Admit date: 10/31/2020 Discharge date: 11/03/2020  Admitted From: home Disposition:snf SNF  Recommendations for Outpatient Follow-up:  Follow up with PCP in 1-2 weeks Please obtain BMP/CBC in one week and CXR in 2-3 weeks Please follow up on the following pending results:  Home Health:NO  Equipment/Devices: NONE  Discharge Condition: Stable Code Status:   Code Status: DNR Diet recommendation:  Diet Order             Diet - low sodium heart healthy           Diet regular Room service appropriate? Yes; Fluid consistency: Thin  Diet effective now                    Brief/Interim Summary: 87 year old male with history of DM2, HLD, PAF, CAD/CABG, CKD 3, chronic pain, BPH brought to the ED from home on 7/4 with complaint of fall, visual and auditory hallucination 1 day prior to the presentation, patient had a nonsyncopal fall at home after which he started having knee pain.  As per the report- His wife was able to get him up and back into his motorized chair.  He continued to have pain at weightbearing.  At baseline, he is only able to bear weight on the left and since the fall he has been very unsteady when transitioning from chair to bed or wheelchair.   In the ED, patient had a heart rate of 105, blood pressure 145/78. Labs in the ED showed WBC count 9.4, hemoglobin 12.6, platelet 254, CK was 263, troponin 23, TSH normal Urine negative for leukocytes and nitrates. Patient was admitted to hospital service for further evaluation management. Patient treated for weakness, debility, work-up showed MRI brain no acute intracranial abnormality, normal TSH ammonia level, treated for generalized weakness, at this time mental status normal.  Patient was managed for A. fib with RVR with Cardizem at this time heart rate is controlled on metoprolol. Seen by PT OT and and has  advised skilled nursing facility.  Patient medically stable discharge pending skilled nursing facility.  "requesting outpatient palliative with [facility] to follow atdischarge   Discharge Diagnoses:  Frequent falls Weakness/debility Hallucination: Underwent extensive work-up with normal MRI brain TSH ammonia level, patient on Depakote and has been out of this medication 2 days ago, could have contributed to symptoms, Depakote resumed.  Mental status intact, continue PT OT and skilled nursing facility placement.  A. fib with RVR history of PAF on metoprolol not on anticoagulation due to history of falls: Had A. fib with RVR on night of admission up to 150 heart rate given Cardizem next morning on metoprolol, heart rate remains stable.  Essential hypertension, stable on metoprolol terazosin  CKD stage IIIa: Overall stable encourage oral hydration. Recent Labs  Lab 10/31/20 1157 11/01/20 0510 11/02/20 0640 11/03/20 0434  BUN 25* 28* 36* 36*  CREATININE 1.31* 1.27* 1.41* 1.32*   T2DM A1c 7 in 2020, continue to monitor.  Metformin has been held due to elevated creatinine can resume as outpatient.  CAD/CABG/HLD: Not on antiplatelets or anticoagulants.  Continue statin.  Abnormal chest x-ray with patchy infiltrates without respiratory symptoms procalcitonin follow-up has been less than 0.5, no respite symptoms fever cough.  Monitoring off antibiotics.  Advise outpatient PCP follow-up iu na week on d/c.  Consults: TOC  Subjective: Alert,awake, resting comfortably.  Complains of burning. Discharge Exam: Vitals:  11/03/20 0356 11/03/20 0720  BP: 111/71 (!) 142/101  Pulse: 72 (!) 108  Resp: 20 18  Temp:  98.6 F (37 C)  SpO2: 96% 95%   General: Pt is alert, awake, not in acute distress Cardiovascular: RRR, S1/S2 +, no rubs, no gallops Respiratory: CTA bilaterally, no wheezing, no rhonchi Abdominal: Soft, NT, ND, bowel sounds + Extremities: no edema, no  cyanosis  Discharge Instructions  Discharge Instructions     Diet - low sodium heart healthy   Complete by: As directed    Discharge instructions   Complete by: As directed    Please call call MD or return to ER for similar or worsening recurring problem that brought you to hospital or if any fever,nausea/vomiting,abdominal pain, uncontrolled pain, chest pain,  shortness of breath or any other alarming symptoms.  Please follow-up your doctor as instructed in a week time and call the office for appointment.  Reassess need for metformin with renal function/ BMP test.  Please avoid alcohol, smoking, or any other illicit substance and maintain healthy habits including taking your regular medications as prescribed.  You were cared for by a hospitalist during your hospital stay. If you have any questions about your discharge medications or the care you received while you were in the hospital after you are discharged, you can call the unit and ask to speak with the hospitalist on call if the hospitalist that took care of you is not available.  Once you are discharged, your primary care physician will handle any further medical issues. Please note that NO REFILLS for any discharge medications will be authorized once you are discharged, as it is imperative that you return to your primary care physician (or establish a relationship with a primary care physician if you do not have one) for your aftercare needs so that they can reassess your need for medications and monitor your lab values   Increase activity slowly   Complete by: As directed       Allergies as of 11/03/2020   No Known Allergies      Medication List     STOP taking these medications    apixaban 5 MG Tabs tablet Commonly known as: ELIQUIS   HYDROcodone-acetaminophen 5-325 MG tablet Commonly known as: NORCO/VICODIN       TAKE these medications    CENTRUM PO Take 1 tablet by mouth daily.   divalproex 125 MG DR  tablet Commonly known as: DEPAKOTE Take 250 mg by mouth 2 (two) times daily.   finasteride 5 MG tablet Commonly known as: PROSCAR Take 5 mg by mouth daily.   folic acid 1 MG tablet Commonly known as: FOLVITE Take 1 mg by mouth daily.   metFORMIN 500 MG tablet Commonly known as: GLUCOPHAGE Take 500 mg by mouth 2 (two) times daily with a meal.   metoprolol tartrate 25 MG tablet Commonly known as: LOPRESSOR Take 1 tablet (25 mg total) by mouth 2 (two) times daily. What changed: how much to take   simvastatin 20 MG tablet Commonly known as: ZOCOR Take 20 mg by mouth at bedtime.   terazosin 1 MG capsule Commonly known as: HYTRIN Take 1 mg by mouth at bedtime.        Contact information for follow-up providers     [name], MD Follow up in 1 week(s).   Specialty: Internal Medicine Contact information: [address]   [place] [address] [phone]         Mariah Milling,  Tollie Pizzaimothy J, MD .   Specialty: Cardiology Contact information: [address] [place] [address] [phone]              Contact information for after-discharge care     Destination     [facility] .   Service: Skilled Nursing Contact information: [address] [place] [id number] [phone]                    No Known Allergies  The results of significant diagnostics from this hospitalization (including imaging, microbiology, ancillary and laboratory) are listed below for reference.    Microbiology: Recent Results (from the past 240 hour(s))  Resp Panel by RT-PCR (Flu A&B, Covid) Nasopharyngeal Swab     Status: None   Collection Time: 10/31/20  7:23 PM   Specimen: Nasopharyngeal Swab; Nasopharyngeal(NP) swabs in vial transport medium  Result Value Ref Range Status   SARS Coronavirus 2 by RT PCR NEGATIVE NEGATIVE Final    Comment: (NOTE) SARS-CoV-2 target nucleic acids are NOT  DETECTED.  The SARS-CoV-2 RNA is generally detectable in upper respiratory specimens during the acute phase of infection. The lowest concentration of SARS-CoV-2 viral copies this assay can detect is 138 copies/mL. A negative result does not preclude SARS-Cov-2 infection and should not be used as the sole basis for treatment or other patient management decisions. A negative result may occur with  improper specimen collection/handling, submission of specimen other than nasopharyngeal swab, presence of viral mutation(s) within the areas targeted by this assay, and inadequate number of viral copies(<138 copies/mL). A negative result must be combined with clinical observations, patient history, and epidemiological information. The expected result is Negative.  Fact Sheet for Patients:  BloggerCourse.comhttps://www.fda.gov/media/152166/download  Fact Sheet for Healthcare Providers:  SeriousBroker.ithttps://www.fda.gov/media/152162/download  This test is no t yet approved or cleared by the [place] FDA and  has been authorized for detection and/or diagnosis of SARS-CoV-2 by FDA under an Emergency Use Authorization (EUA). This EUA will remain  in effect (meaning this test can be used) for the duration of the COVID-19 declaration under Section 564(b)(1) of the Act, 21 U.S.C.section 360bbb-3(b)(1), unless the authorization is terminated  or revoked sooner.       Influenza A by PCR NEGATIVE NEGATIVE Final   Influenza B by PCR NEGATIVE NEGATIVE Final    Comment: (NOTE) The Xpert Xpress SARS-CoV-2/FLU/RSV plus assay is intended as an aid in the diagnosis of influenza from Nasopharyngeal swab specimens and should not be used as a sole basis for treatment. Nasal washings and aspirates are unacceptable for Xpert Xpress SARS-CoV-2/FLU/RSV testing.  Fact Sheet for Patients: BloggerCourse.comhttps://www.fda.gov/media/152166/download  Fact Sheet for Healthcare Providers: SeriousBroker.ithttps://www.fda.gov/media/152162/download  This test is not yet  approved or cleared by the [place] FDA and has been authorized for detection and/or diagnosis of SARS-CoV-2 by FDA under an Emergency Use Authorization (EUA). This EUA will remain in effect (meaning this test can be used) for the duration of the COVID-19 declaration under Section 564(b)(1) of the Act, 21 U.S.C. section 360bbb-3(b)(1), unless the authorization is terminated or revoked.  Performed at [facility], [address] [place], [place] [address]     Procedures/Studies: CT Head Wo Contrast  Result Date: 10/31/2020 CLINICAL DATA:  87 year old with fall. EXAM: CT HEAD WITHOUT CONTRAST TECHNIQUE: Contiguous axial images were obtained from the base of the skull through the vertex without intravenous contrast. COMPARISON:  05/02/2020 FINDINGS: Brain: Stable mild cerebral atrophy. No evidence  for acute hemorrhage, mass lesion, midline shift, hydrocephalus or large infarct. Stable low-density in the white matter is suggestive for chronic changes. Vascular: No hyperdense vessel or unexpected calcification. Skull: Normal. Negative for fracture or focal lesion. Sinuses/Orbits: Mucosal thickening and possible polyp in the left sphenoid sinus. Findings are similar to the previous examination. Other: None IMPRESSION: 1. No acute intracranial abnormality. 2. Persistent left sphenoid sinus disease. Electronically Signed   By: Richarda Overlie M.D.   On: 10/31/2020 14:08   MR BRAIN WO CONTRAST  Result Date: 10/31/2020 CLINICAL DATA:  Acute neurologic deficit EXAM: MRI HEAD WITHOUT CONTRAST TECHNIQUE: Multiplanar, multiecho pulse sequences of the brain and surrounding structures were obtained without intravenous contrast. COMPARISON:  11/21/2016 FINDINGS: Brain: No acute infarct, mass effect or extra-axial collection. Single focus of chronic microhemorrhage in the right frontal white matter. There is multifocal hyperintense T2-weighted signal within the white matter. Generalized volume loss without  a clear lobar predilection. The midline structures are normal. Vascular: Major flow voids are preserved. Skull and upper cervical spine: Normal calvarium and skull base. Visualized upper cervical spine and soft tissues are normal. Sinuses/Orbits:No paranasal sinus fluid levels or advanced mucosal thickening. No mastoid or middle ear effusion. Normal orbits. IMPRESSION: 1. No acute intracranial abnormality. 2. Findings of chronic microvascular ischemia and generalized volume loss. Electronically Signed   By: Deatra Robinson M.D.   On: 10/31/2020 23:00   DG Chest Port 1 View  Result Date: 10/31/2020 CLINICAL DATA:  Weakness.  Multiple falls. EXAM: PORTABLE CHEST 1 VIEW COMPARISON:  01/26/2020 FINDINGS: Postoperative changes in the mediastinum. Shallow inspiration with elevation of the right hemidiaphragm. Normal heart size and pulmonary vascularity. Patchy airspace disease suggested in the lungs, progressing since previous study. This may represent multifocal pneumonia. No pleural effusions. No pneumothorax. Mediastinal contours appear intact. Calcification of the aorta. Degenerative changes in the spine and shoulders. IMPRESSION: Developing patchy infiltrates in the lungs. Chronic elevation of right hemidiaphragm. Electronically Signed   By: Burman Nieves M.D.   On: 10/31/2020 19:24   DG Knee Complete 4 Views Left  Result Date: 10/31/2020 CLINICAL DATA:  Left leg pain after fall. EXAM: LEFT KNEE - COMPLETE 4+ VIEW COMPARISON:  None. FINDINGS: No evidence of fracture, dislocation, or joint effusion. Moderate narrowing and osteophyte formation is seen involving the medial and lateral joint spaces. Soft tissues are unremarkable. IMPRESSION: Moderate degenerative joint disease.  No acute abnormality seen. Electronically Signed   By: Lupita Raider M.D.   On: 10/31/2020 12:58   DG Hip Unilat W or Wo Pelvis 2-3 Views Left  Result Date: 10/31/2020 CLINICAL DATA:  Bilateral leg pain after fall yesterday. EXAM: DG  HIP (WITH OR WITHOUT PELVIS) 2-3V LEFT COMPARISON:  None. FINDINGS: There is no evidence of hip fracture or dislocation. Mild osteophyte formation is seen involving the left hip. IMPRESSION: Mild degenerative joint disease of the left hip. No definite acute abnormality is seen. Electronically Signed   By: Lupita Raider M.D.   On: 10/31/2020 12:57    Labs: BNP (last 3 results) No results for input(s): BNP in the last 8760 hours. Basic Metabolic Panel: Recent Labs  Lab 10/31/20 1157 11/01/20 0510 11/02/20 0640 11/03/20 0434  NA 132* 132* 134* 136  K 4.6 4.3 4.5 4.2  CL 97* 99 100 102  CO2 22 28 27 26   GLUCOSE 150* 124* 116* 117*  BUN 25* 28* 36* 36*  CREATININE 1.31* 1.27* 1.41* 1.32*  CALCIUM 9.1 8.7* 8.5* 8.5*  MG  --   --  1.9  --   PHOS  --   --  3.6  --    Liver Function Tests: No results for input(s): AST, ALT, ALKPHOS, BILITOT, PROT, ALBUMIN in the last 168 hours. No results for input(s): LIPASE, AMYLASE in the last 168 hours. Recent Labs  Lab 10/31/20 2002  AMMONIA 11   CBC: Recent Labs  Lab 10/31/20 1157 11/01/20 0510 11/02/20 0640 11/03/20 0434  WBC 9.4 9.9 9.3 8.7  NEUTROABS 4.4  --  5.0 4.6  HGB 12.6* 11.3* 11.1* 10.7*  HCT 38.1* 33.5* 33.5* 32.3*  MCV 94.5 92.5 92.5 93.4  PLT 254 225 238 241   Cardiac Enzymes: Recent Labs  Lab 10/31/20 1157  CKTOTAL 263   BNP: Invalid input(s): POCBNP CBG: Recent Labs  Lab 11/02/20 0850 11/02/20 1215 11/02/20 1551 11/02/20 2053 11/03/20 0723  GLUCAP 110* 125* 160* 127* 103*   D-Dimer No results for input(s): DDIMER in the last 72 hours. Hgb A1c Recent Labs    11/01/20 0510  HGBA1C 6.5*   Lipid Profile No results for input(s): CHOL, HDL, LDLCALC, TRIG, CHOLHDL, LDLDIRECT in the last 72 hours. Thyroid function studies Recent Labs    10/31/20 1157  TSH 3.965   Anemia work up Recent Labs    10/31/20 2002  VITAMINB12 1,236*   Urinalysis    Component Value Date/Time   COLORURINE YELLOW (A)  10/31/2020 1156   APPEARANCEUR CLEAR (A) 10/31/2020 1156   APPEARANCEUR Clear 10/16/2013 0852   LABSPEC 1.020 10/31/2020 1156   LABSPEC 1.023 10/16/2013 0852   PHURINE 5.0 10/31/2020 1156   GLUCOSEU NEGATIVE 10/31/2020 1156   GLUCOSEU Negative 10/16/2013 0852   HGBUR NEGATIVE 10/31/2020 1156   BILIRUBINUR NEGATIVE 10/31/2020 1156   BILIRUBINUR Negative 10/16/2013 0852   KETONESUR 5 (A) 10/31/2020 1156   PROTEINUR NEGATIVE 10/31/2020 1156   UROBILINOGEN 0.2 09/12/2010 0120   NITRITE NEGATIVE 10/31/2020 1156   LEUKOCYTESUR NEGATIVE 10/31/2020 1156   LEUKOCYTESUR Negative 10/16/2013 0852   Sepsis Labs Invalid input(s): PROCALCITONIN,  WBC,  LACTICIDVEN Microbiology Recent Results (from the past 240 hour(s))  Resp Panel by RT-PCR (Flu A&B, Covid) Nasopharyngeal Swab     Status: None   Collection Time: 10/31/20  7:23 PM   Specimen: Nasopharyngeal Swab; Nasopharyngeal(NP) swabs in vial transport medium  Result Value Ref Range Status   SARS Coronavirus 2 by RT PCR NEGATIVE NEGATIVE Final    Comment: (NOTE) SARS-CoV-2 target nucleic acids are NOT DETECTED.  The SARS-CoV-2 RNA is generally detectable in upper respiratory specimens during the acute phase of infection. The lowest concentration of SARS-CoV-2 viral copies this assay can detect is 138 copies/mL. A negative result does not preclude SARS-Cov-2 infection and should not be used as the sole basis for treatment or other patient management decisions. A negative result may occur with  improper specimen collection/handling, submission of specimen other than nasopharyngeal swab, presence of viral mutation(s) within the areas targeted by this assay, and inadequate number of viral copies(<138 copies/mL). A negative result must be combined with clinical observations, patient history, and epidemiological information. The expected result is Negative.  Fact Sheet for Patients:  BloggerCourse.com  Fact Sheet  for Healthcare Providers:  SeriousBroker.it  This test is no t yet approved or cleared by the [place] FDA and  has been authorized for detection and/or diagnosis of SARS-CoV-2 by FDA under an Emergency Use Authorization (EUA). This EUA will remain  in effect (meaning this test can be used) for the duration of the COVID-19 declaration under  Section 564(b)(1) of the Act, 21 U.S.C.section 360bbb-3(b)(1), unless the authorization is terminated  or revoked sooner.       Influenza A by PCR NEGATIVE NEGATIVE Final   Influenza B by PCR NEGATIVE NEGATIVE Final    Comment: (NOTE) The Xpert Xpress SARS-CoV-2/FLU/RSV plus assay is intended as an aid in the diagnosis of influenza from Nasopharyngeal swab specimens and should not be used as a sole basis for treatment. Nasal washings and aspirates are unacceptable for Xpert Xpress SARS-CoV-2/FLU/RSV testing.  Fact Sheet for Patients: BloggerCourse.com  Fact Sheet for Healthcare Providers: SeriousBroker.it  This test is not yet approved or cleared by the [place] FDA and has been authorized for detection and/or diagnosis of SARS-CoV-2 by FDA under an Emergency Use Authorization (EUA). This EUA will remain in effect (meaning this test can be used) for the duration of the COVID-19 declaration under Section 564(b)(1) of the Act, 21 U.S.C. section 360bbb-3(b)(1), unless the authorization is terminated or revoked.  Performed at [facility], [address] [place], [place] [address]      Time coordinating discharge: 25 minutes  SIGNED: Lanae Boast, MD  [facility] 11/03/2020, 10:12 AM  If 7PM-7AM, please contact night-coverage [email or web address]   35

## 2020-11-03 NOTE — TOC Transition Note (Signed)
Transition of Care [facility]) - CM/SW Discharge Note   Patient Details  Name: ARSHAWN VALDEZ MRN: [id number] Date of Birth: [birth year]  Transition of Care [facility]) CM/SW Contact:  Allayne Butcher, RN Phone Number: 11/03/2020, 12:23 PM   Clinical Narrative:    Patient will discharge over to [facility] today.  Wife is heading over now to sign admission paperwork.  Patient will transport via EMS.  Bedside RN will call report once room as been assigned.  COVID test pending.     Final next level of care: Skilled Nursing Facility Barriers to Discharge: Barriers Resolved   Patient Goals and CMS Choice Patient states their goals for this hospitalization and ongoing recovery are:: Wife agrees to rehab at [facility].gov Compare Post Acute Care list provided to:: Patient Represenative (must comment) Choice offered to / list presented to : Spouse  Discharge Placement   Existing PASRR number confirmed : 11/01/20          Patient chooses bed at: [facility] Patient to be transferred to facility by: [facility] Name of family member notified: [name] Wife Patient and family notified of of transfer: 11/03/20  Discharge Plan and Services   Discharge Planning Services: CM Consult Post Acute Care Choice: Home Health          DME Arranged: N/A DME Agency: NA       HH Arranged: RN, PT, OT, Nurse's Aide          Social Determinants of Health (SDOH) Interventions     Readmission Risk Interventions No flowsheet data found.

## 2020-11-03 NOTE — Progress Notes (Signed)
[facility] Room 117 [facility]) Hospital Liaison RN note  Notified by [facility] Robbie Lis, RN of patient/family request for [facility] Palliative services at discharge.  [facility] hospital liaison will follow patient for discharge disposition.  Please call with any hospice or outpatient palliative care related questions.  Thank you for the opportunity to participate in this patient's care.  Haynes Bast, BSN, RN [facility] Liaison [phone]

## 2020-11-03 NOTE — TOC Progression Note (Addendum)
Transition of Care [facility]) - Progression Note    Patient Details  Name: Mark Dougherty MRN: [id number] Date of Birth: 1926/05/06  Transition of [facility]) CM/SW Contact  Allayne Butcher, RN Phone Number: 11/03/2020, 1:55 PM  Clinical Narrative:    Patient's COVID test back and negative.  RNCM has arranged EMS.  OP palliative referral given to [facility] with [facility]. Medicaid application is being submitted by financial counselor here at the hospital.     Expected Discharge Plan: Skilled Nursing Facility Barriers to Discharge: Barriers Resolved  Expected Discharge Plan and Services Expected Discharge Plan: Skilled Nursing Facility   Discharge Planning Services: CM Consult Post Acute Care Choice: Home Health Living arrangements for the past 2 months: Single Family Home Expected Discharge Date: 11/03/20               DME Arranged: N/A DME Agency: NA       HH Arranged: RN, PT, OT, Nurse's Aide           Social Determinants of Health (SDOH) Interventions    Readmission Risk Interventions No flowsheet data found.

## 2020-11-18 ENCOUNTER — Ambulatory Visit: Payer: Medicare Other | Admitting: Nurse Practitioner

## 2020-11-23 ENCOUNTER — Other Ambulatory Visit
Admission: RE | Admit: 2020-11-23 | Discharge: 2020-11-23 | Disposition: A | Discharge status: Home / Self Care | Payer: No Typology Code available for payment source | Source: Ambulatory Visit | Attending: Family Medicine | Admitting: Family Medicine

## 2020-11-23 DIAGNOSIS — R300 Dysuria: Secondary | ICD-10-CM | POA: Diagnosis present

## 2020-11-23 LAB — URINALYSIS, COMPLETE (UACMP) WITH MICROSCOPIC
Bacteria, UA: NONE SEEN
Bilirubin Urine: NEGATIVE
Glucose, UA: NEGATIVE mg/dL
Hgb urine dipstick: NEGATIVE
Ketones, ur: 5 mg/dL — AB
Leukocytes,Ua: NEGATIVE
Nitrite: NEGATIVE
Protein, ur: NEGATIVE mg/dL
Specific Gravity, Urine: 1.027 (ref 1.005–1.030)
pH: 5 (ref 5.0–8.0)

## 2020-11-24 LAB — URINE CULTURE
Culture: NO GROWTH
Special Requests: NORMAL

## 2020-12-07 ENCOUNTER — Non-Acute Institutional Stay: Payer: Medicare Other | Admitting: Primary Care

## 2020-12-07 ENCOUNTER — Other Ambulatory Visit: Payer: Self-pay

## 2020-12-07 DIAGNOSIS — Z515 Encounter for palliative care: Secondary | ICD-10-CM

## 2020-12-07 DIAGNOSIS — I420 Dilated cardiomyopathy: Secondary | ICD-10-CM

## 2020-12-07 DIAGNOSIS — R296 Repeated falls: Secondary | ICD-10-CM

## 2020-12-07 DIAGNOSIS — M545 Low back pain, unspecified: Secondary | ICD-10-CM

## 2020-12-07 DIAGNOSIS — I5022 Chronic systolic (congestive) heart failure: Secondary | ICD-10-CM

## 2020-12-07 NOTE — Progress Notes (Signed)
[facility] Palliative Care Consult Note Telephone: [phone]  Fax: [phone]   Date of encounter: 12/07/20 PATIENT NAME: Mark Dougherty [address] [place] [address]   [phone] (home)  DOB: November 10, 1926 MRN: [id number] PRIMARY CARE PROVIDER:    Albina Billet, MD,  [address]   [place] [address] [phone]  [place]:    Brayton Mars, [place],  [place] [address] [phone]  RESPONSIBLE PARTY:    Contact Information     Name Relation Home Work Mobile   Arimo Spouse [phone]  [phone]       I met face to face with patient and family in [facility] facility. Palliative Care was asked to follow this patient by consultation request of  Brayton Mars, MD to address advance care planning and complex medical decision making. This is the initial visit.                                    ASSESSMENT AND PLAN / RECOMMENDATIONS:   Advance Care Planning/Goals of Care: Goals include to maximize quality of life and symptom management. Patient/health care surrogate gave his/her permission to discuss.Spoke with wife by phone. Our advance care planning conversation included a discussion about:    The value and importance of advance care planning  Experiences with loved ones who have been seriously ill or have died - her mother was on hospice  Exploration of personal, cultural or spiritual beliefs that might influence medical decisions - they have agreed not to prolong with poor QoL Exploration of goals of care in the event of a sudden injury or illness - recent ED trip Identification  of a healthcare agent - Wife Joadie. Review of an  advance directive document - DNR on record. No MOST but endorses comfort care Decision to de-escalate disease focused treatments due to poor prognosis- requests hospice services. We discussed the hospice program and what services it will provide. We  discussed use of morphine and indications for it. CODE STATUS: DNR Hospice referral made  Symptom Management/Plan:  Pain: Patient has increasing pain and s/sx EOL. I recommend morphine concentrate 20 mg/ ml,  2.5  mg q 4 hrs po x 3 days, then increase to 5 mg q 4 hrs. He has pain in his LE and back (longstanding) but wounds are decompensating and will be more painful as they progress. Wound healing is not a goal due to very little po intake.   Nutrition: Has ceased eating and has had early satiety x 1 month per wife's report. Recommend to offer for pleasure diet only and provide EoL mouth care for comfort.   Goals of care: Patient is in EoL decline and wife has chosen hospice care. She voices they have been married 96 years and he is the love of her life. She became tearful. She has apprised her and his children and states they are supportive of her decisions. I have made referral per her request to [facility].   I spent 45 minutes providing this consultation. More than 50% of the time in this consultation was spent in counseling and care coordination.  Follow up Palliative Care Visit: Hand off to hospice  PPS: 20%  HOSPICE ELIGIBILITY/DIAGNOSIS: TBD  Chief Complaint: decline, debility  HISTORY OF PRESENT ILLNESS:  Mark Dougherty is a 87 y.o. year old male  with  DM2, HLD, PAF, CAD/CABG, CKD 3, chronic pain, 5% weight loss in 1 month, frequent falls. Went to SNF from hospital stay after a fall but has  continued to decline. Is not eating and has increased pain and wounds. Hospice has been recommended and accepted by family.  History obtained from review of EMR, discussion with primary team, and interview with family, facility staff/caregiver and/or Mr. Tutson.  I reviewed available labs, medications, imaging, studies and related documents from the EMR.  Records reviewed and summarized above.   ROS/staff/family   General: NAD ENMT: endorses dysphagia Cardiovascular: denies  chest pain, denies DOE Pulmonary: endorses occ cough, denies increased SOB Abdomen: endorses poor to no appetite, endorses constipation, endorses incontinence of bowel GU: denies dysuria, endorses incontinence of urine MSK:  endorses weakness,  no falls reported at SNF Skin: wounds on LE and sacrum Neurological: endorses increasing  pain, denies insomnia Psych: Endorses flat mood Heme/lymph/immuno: denies bruises, abnormal bleeding  Physical Exam: Current and past weights: 183 lbs, 5% wt loss in 1 month. Previously 200 lbs. (8% loss from baseline) Constitutional: NAD,  139/73 Hr 61 RR 18 General: frail appearing EYES: anicteric sclera, lids intact, no discharge  ENMT: intact hearing, oral mucous membranes dry, edentulous CV: S1S2, RRR, slight bil  LE edema Pulmonary: LCTA, no increased work of breathing, no cough, room air, PO2=99% Abdomen: intake <25%, soft and non tender, no ascites GU: deferred MSK: moderate sarcopenia, moves all extremities but is painful, bedbound Skin: reports by staff breakdown on sacrum and L LE bandaged. Neuro: severe  generalized weakness,  severe cognitive impairment Psych: anxious affect, A and O x 1 Hem/lymph/immuno: no widespread bruising CURRENT PROBLEM LIST:  Patient Active Problem List   Diagnosis Date Noted   Frequent falls 10/31/2020   Knee injury 12/21/2018   Unilateral primary osteoarthritis, left knee 11/28/2017   Unilateral primary osteoarthritis, right knee 11/28/2017   Syncope 11/29/2016   Acute left-sided low back pain without sciatica 63/81/7711   Chronic systolic CHF (congestive heart failure) (HCC)    Ischemic cardiomyopathy    Thyroid disease    Typical atrial flutter ([place])    Atrial flutter ([place]) 05/31/2015   Neuropathy 05/31/2015   PAF (paroxysmal atrial fibrillation) ([place]) 01/04/2011   Congestive dilated cardiomyopathy ([place]) 09/28/2010   Dehydration 09/14/2010   Diabetes mellitus type 2, controlled, with complications ([place])  65/79/0383   Hyperlipidemia 01/09/2010   CAD (coronary artery disease) 01/09/2010   Carotid stenosis 01/09/2010   PAST MEDICAL HISTORY:  Active Ambulatory Problems    Diagnosis Date Noted   Diabetes mellitus type 2, controlled, with complications ([place]) 33/83/2919   Hyperlipidemia 01/09/2010   CAD (coronary artery disease) 01/09/2010   Carotid stenosis 01/09/2010   Dehydration 09/14/2010   Congestive dilated cardiomyopathy ([place]) 09/28/2010   PAF (paroxysmal atrial fibrillation) ([place]) 01/04/2011   Atrial flutter ([place]) 05/31/2015   Neuropathy 05/31/2015   Typical atrial flutter (HCC)    Chronic systolic CHF (congestive heart failure) ([place])    Ischemic cardiomyopathy    Thyroid disease    Acute left-sided low back pain without sciatica 11/12/2016   Syncope 11/29/2016   Unilateral primary osteoarthritis, left knee 11/28/2017   Unilateral primary osteoarthritis, right knee 11/28/2017   Knee injury 12/21/2018   Frequent falls 10/31/2020   Resolved Ambulatory Problems    Diagnosis Date Noted   No Resolved Ambulatory Problems   Past Medical History:  Diagnosis Date   A-fib [facility])    BPH (benign prostatic hyperplasia)    Carotid  artery stenosis    Chronic combined systolic and diastolic CHF (congestive heart failure) (HCC)    Diabetes mellitus    Hypotension    Left wrist fracture    Peripheral neuropathy    Spinal stenosis    SOCIAL HX:  Social History   Tobacco Use   Smoking status: Never   Smokeless tobacco: Former  Substance Use Topics   Alcohol use: No   FAMILY HX:  Family History  Family history unknown: Yes  No further family history attainable from patient or on chart review; no family present.    ALLERGIES: No Known Allergies   PERTINENT MEDICATIONS:  Outpatient Encounter Medications as of 12/07/2020  Medication Sig   divalproex (DEPAKOTE) 125 MG DR tablet Take 250 mg by mouth 2 (two) times daily.   finasteride (PROSCAR) 5 MG tablet Take 5 mg by mouth  daily.   folic acid (FOLVITE) 1 MG tablet Take 1 mg by mouth daily.     metFORMIN (GLUCOPHAGE) 500 MG tablet Take 500 mg by mouth 2 (two) times daily with a meal.     metoprolol tartrate (LOPRESSOR) 25 MG tablet Take 1 tablet (25 mg total) by mouth 2 (two) times daily.   Multiple Vitamins-Minerals (CENTRUM PO) Take 1 tablet by mouth daily.   simvastatin (ZOCOR) 20 MG tablet Take 20 mg by mouth at bedtime.   terazosin (HYTRIN) 1 MG capsule Take 1 mg by mouth at bedtime.   traZODone (DESYREL) 50 MG tablet Take 50 mg by mouth at bedtime.   No facility-administered encounter medications on file as of 12/07/2020.     Thank you for the opportunity to participate in the care of Mr. Magri.  The palliative care team will continue to follow. Please call our office at [phone] if we can be of additional assistance.   Jason Coop, NP ,   COVID-19 PATIENT SCREENING TOOL Asked and negative response unless otherwise noted:  Have you had symptoms of covid, tested positive or been in contact with someone with symptoms/positive test in the past 5-10 days?

## 2020-12-29 DEATH — deceased

## 2021-01-18 ENCOUNTER — Telehealth: Payer: Self-pay

## 2021-01-18 NOTE — Telephone Encounter (Signed)
Was able to reach out to representative with [employer] Reference number [id number]  Representative reports pt's wife [name] called the [employer] foundation for Eliquis with [facility] to cancel pt's prescription as the hospitals had cancel Mr. Lina Eliquis upon d/c from the hospital on 11/03/2020.  That trigger pharmacy team with [facility] to start an adverse event questionnaire.   Advised unknown it pt is decease, no record of that at this time. Provided additional information regarding his Eliqwis d/c after hospital encounter "emergency department for treatment and evaluation of visual hallucinations and knee pain after nonsyncopal fall at home yesterday." " At baseline, he is only able to bear weight on the left and since the injury, he has been very unsteady when transitioning from chair to bed or wheelchair.  Patient denies any head strike or neck pain.  No loss of consciousness. He believes the reason he is having hallucinations is due to being off"  Suggest sending form back in with case number [id number] explaining additional information has been provided over the phone, unsure of pt's death.

## 2022-12-06 IMAGING — CR DG HIP (WITH OR WITHOUT PELVIS) 2-3V*L*
1 series · 3 of 3 positions shown · non-contrast
Comparison: None.

CLINICAL DATA: Bilateral leg pain after fall yesterday.

EXAM:
DG HIP (WITH OR WITHOUT PELVIS) 2-3V LEFT

[Series 1: dg hip unilat w or w/o pelvis 2-3 views  · non-contrast · 0.14mm/px · 3 of 3 slices shown]
[im 1/3]
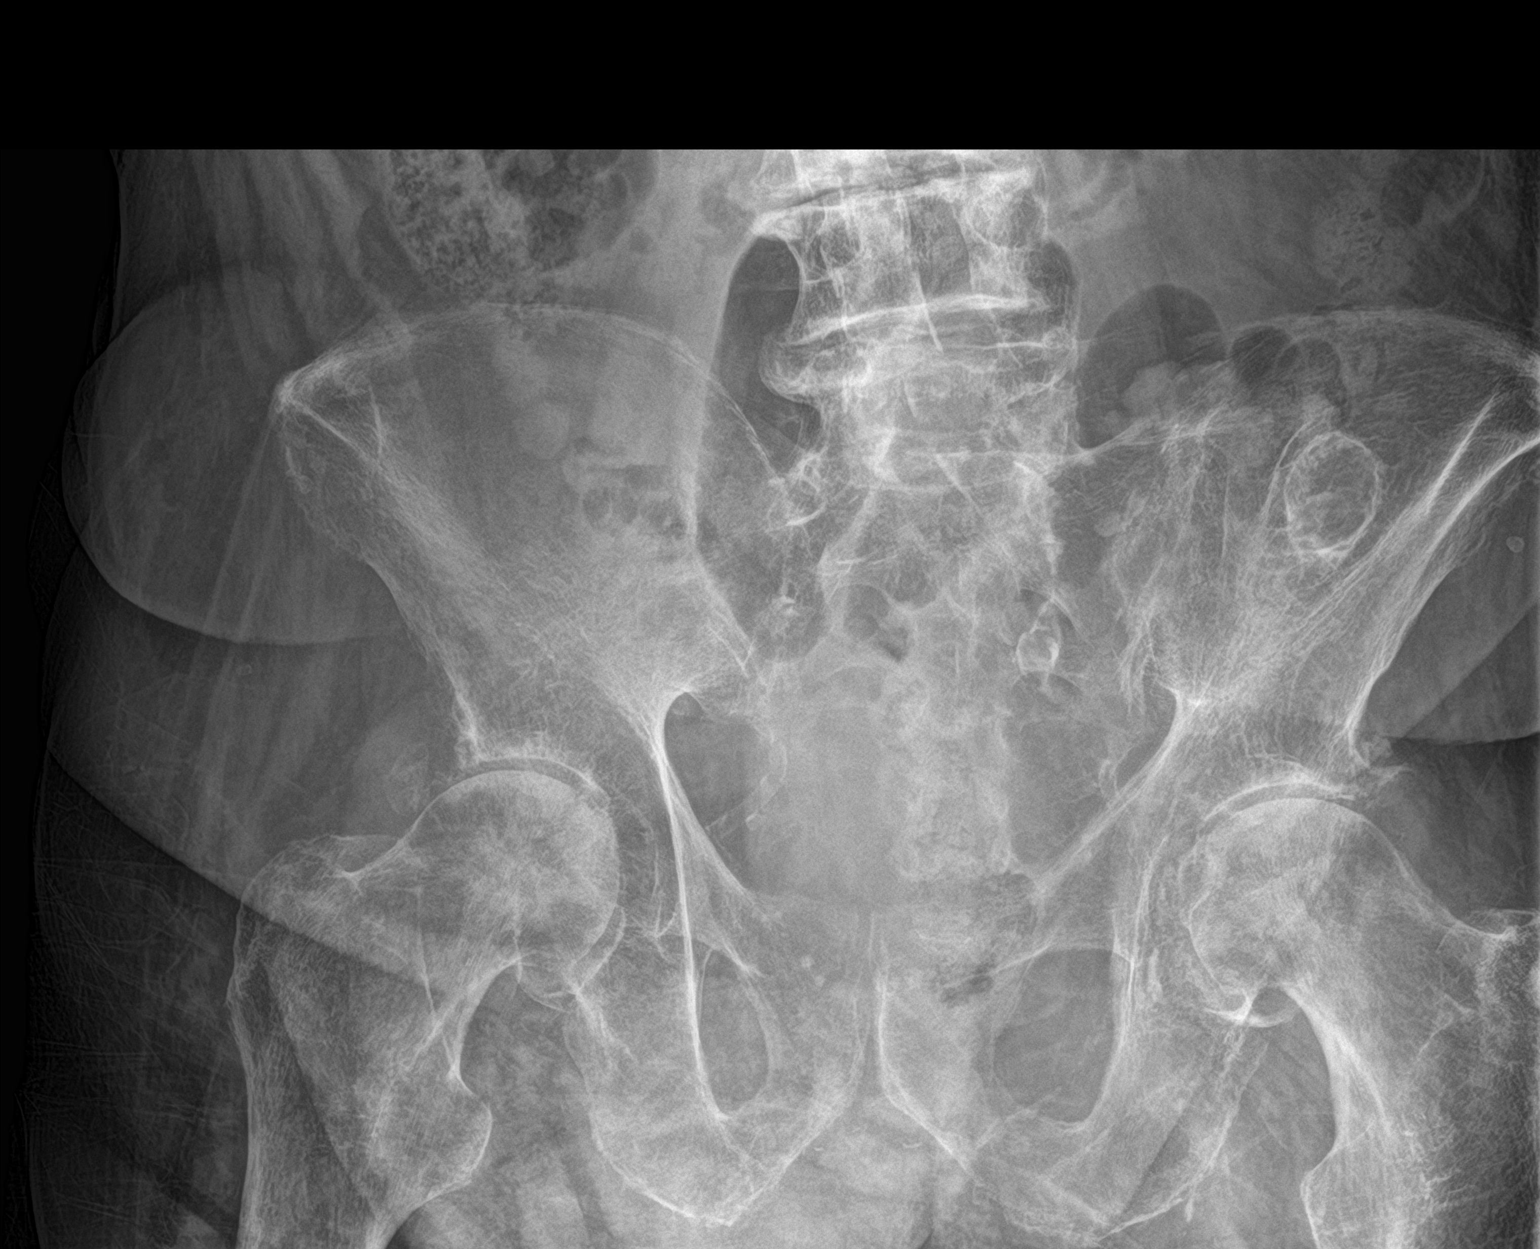
[im 2/3]
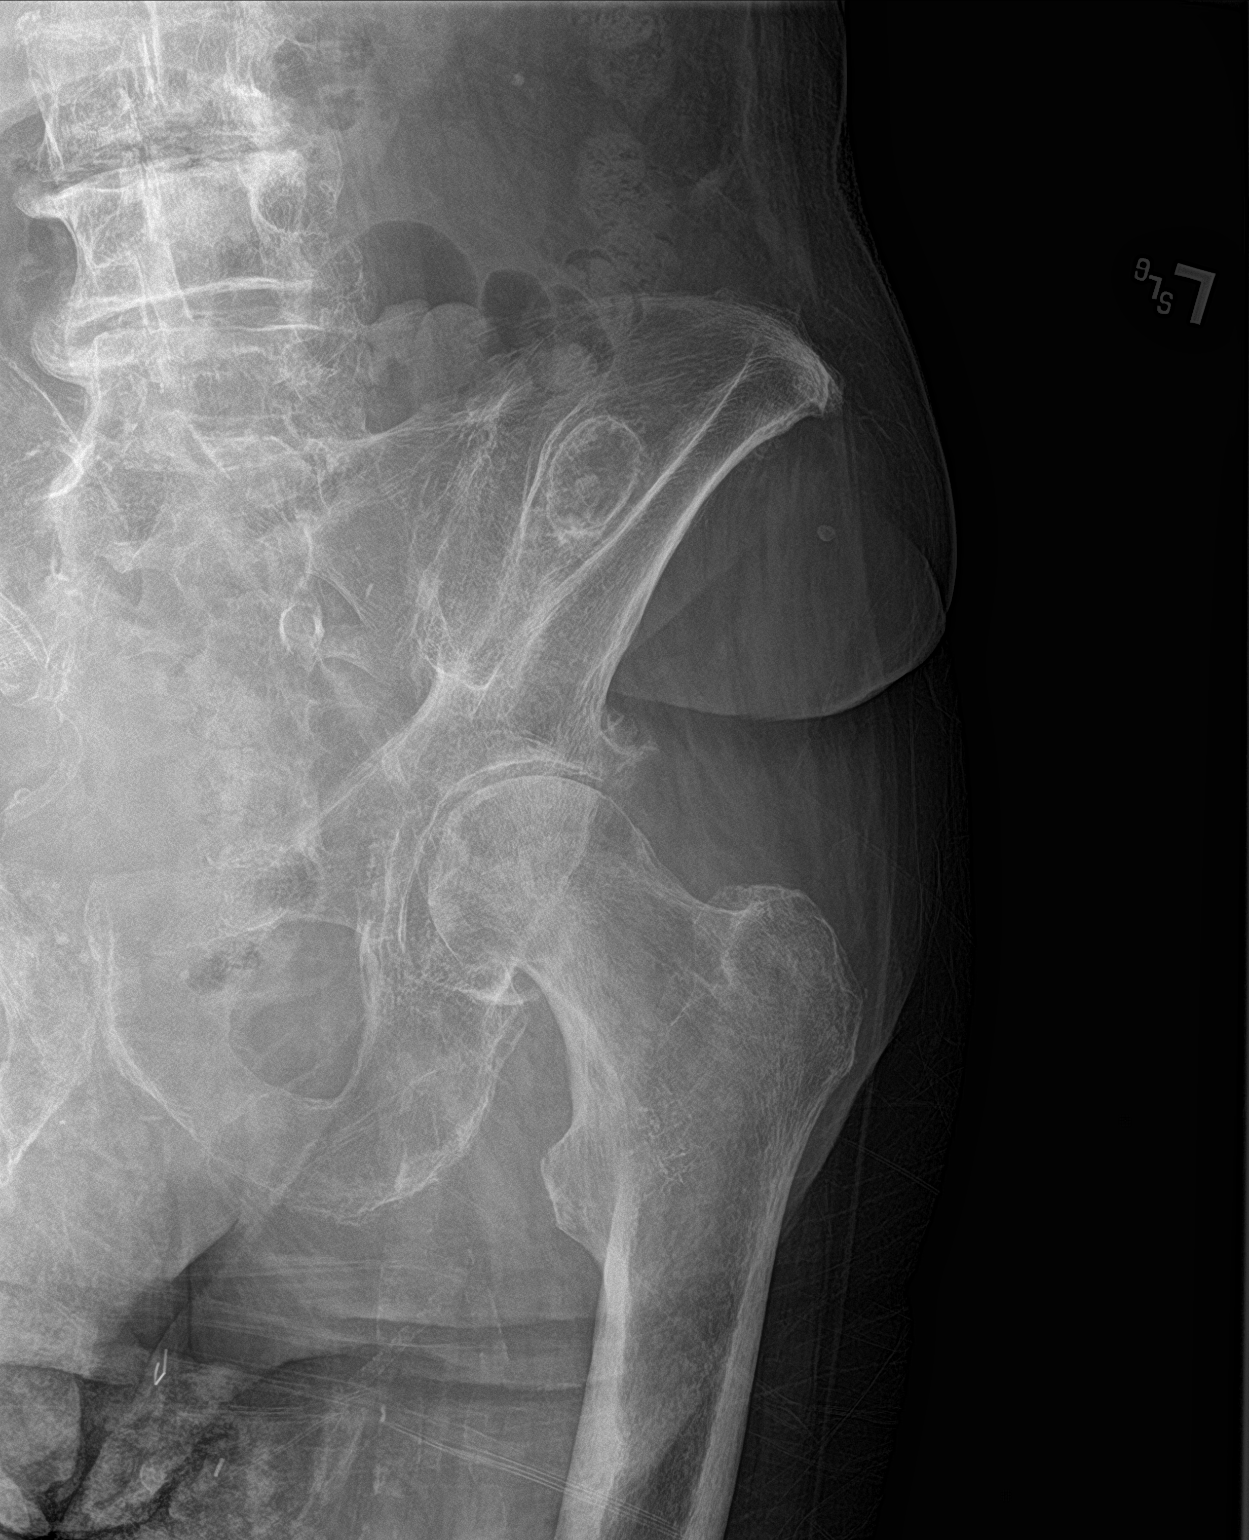
[im 3/3]
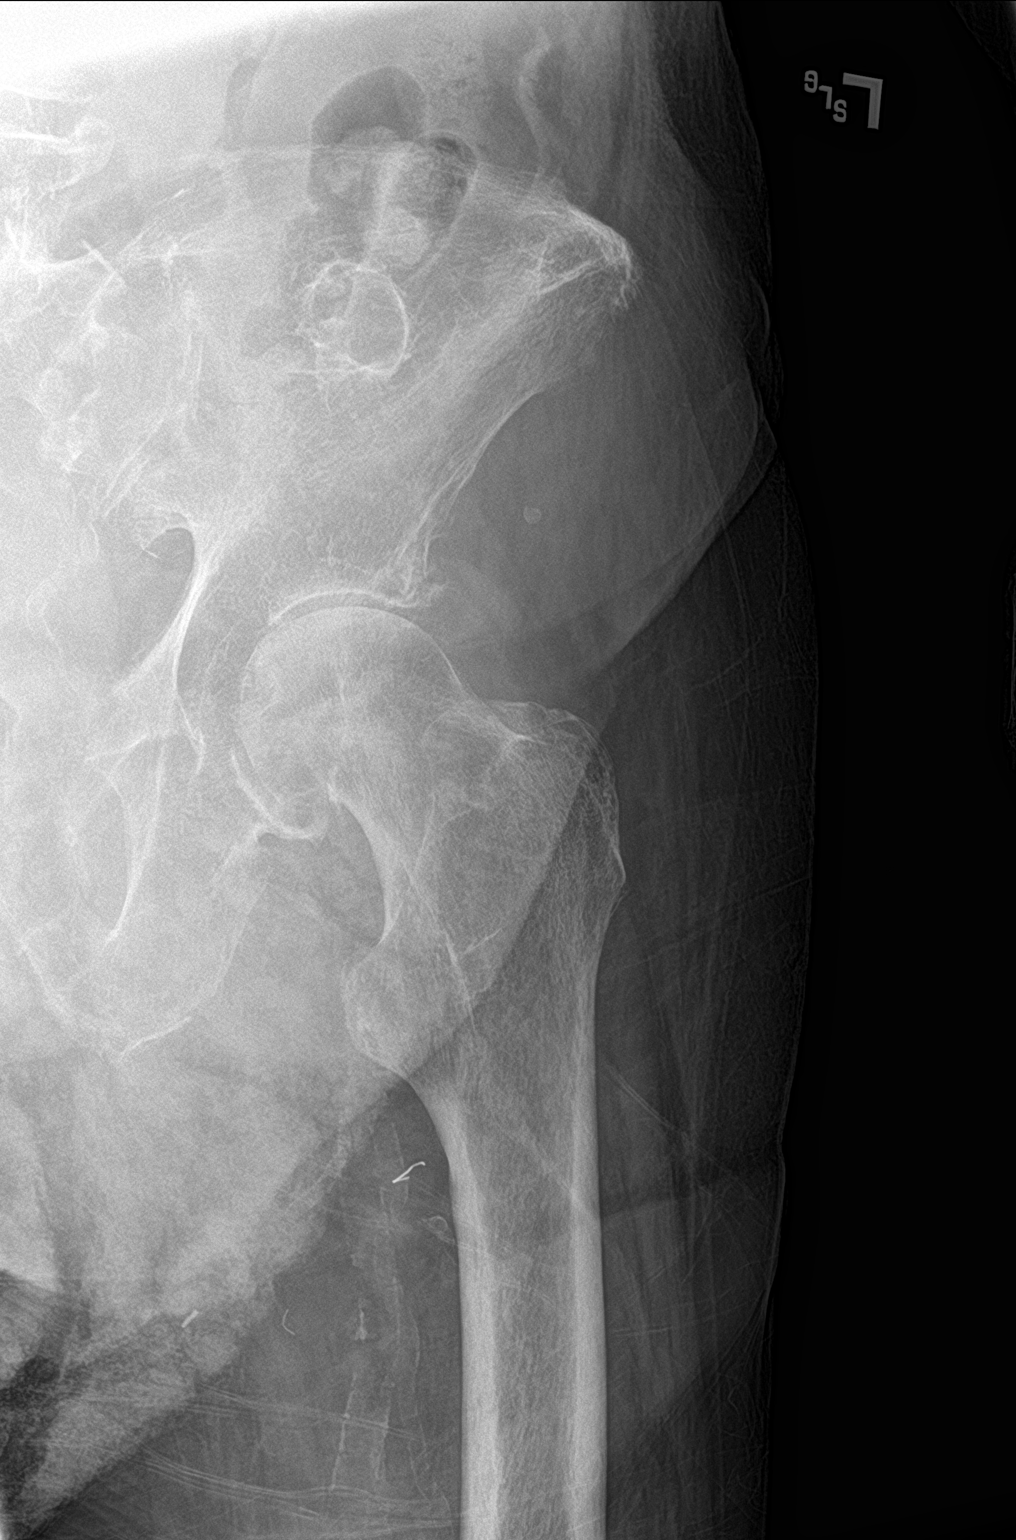

[3 of 3 positions shown; findings below may reference images not displayed]

FINDINGS: There is no evidence of hip fracture or dislocation. Mild osteophyte
formation is seen involving the left hip.
IMPRESSION: Mild degenerative joint disease of the left hip. No definite acute
abnormality is seen.

## 2022-12-06 IMAGING — CT CT HEAD W/O CM
3 series · 16 of 47 positions shown, 19 images · non-contrast
Comparison: 05/02/2020

CLINICAL DATA: [AGE] with fall.

EXAM:
CT HEAD WITHOUT CONTRAST
TECHNIQUE: Contiguous axial images were obtained from the base of the skull
through the vertex without intravenous contrast.

[Series 2: head wo · axial · 0.44mm/px · z∈[-132,+8]mm · 10 of 34 slices shown, 13 images]
[im 3/34  brain]
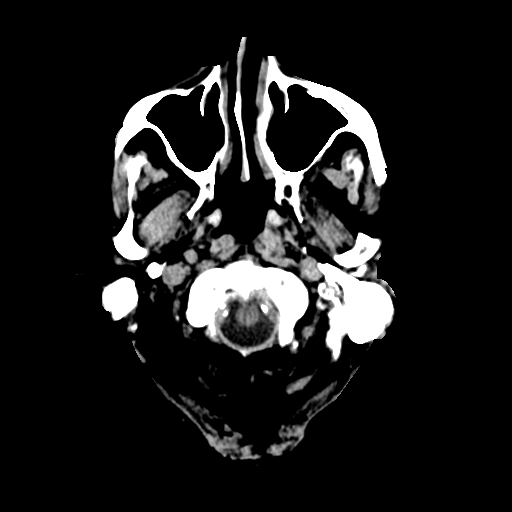
[im 3/34  bone]
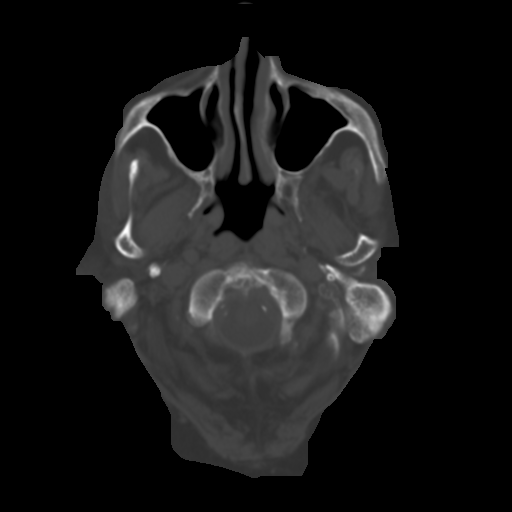
[im 6/34  brain]
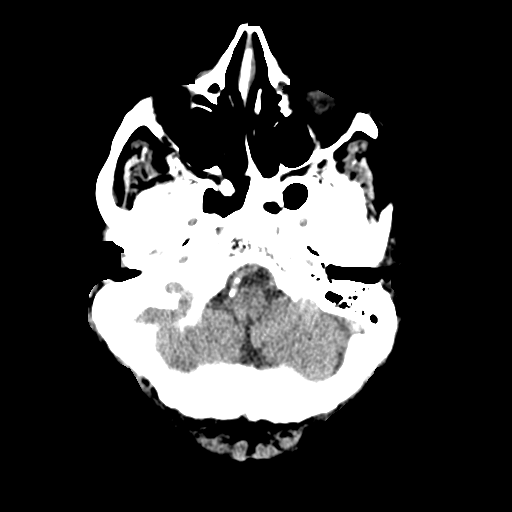
[im 10/34  brain]
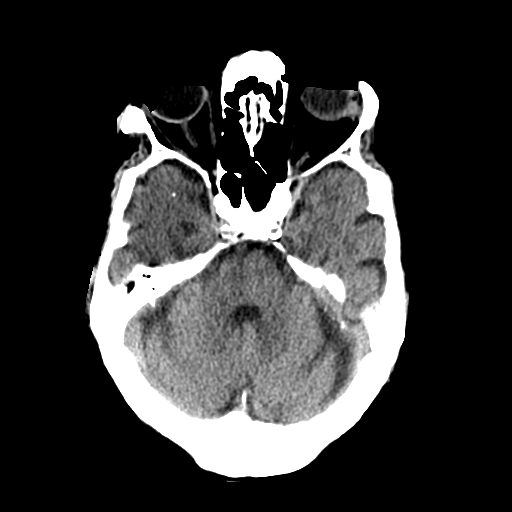
[im 12/34  brain]
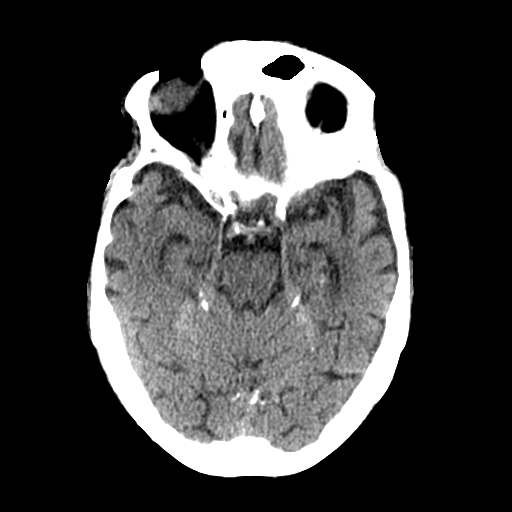
[im 15/34  brain]
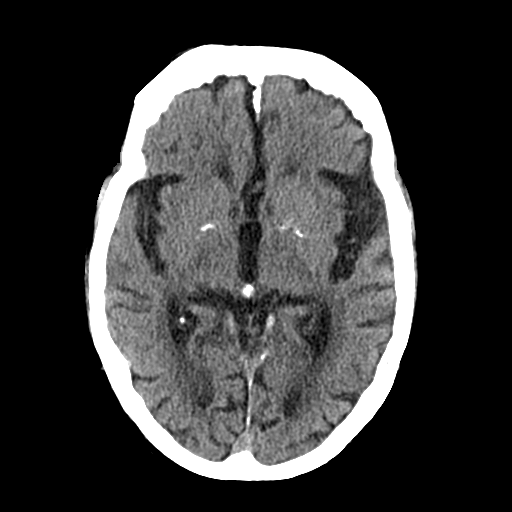
[im 15/34  bone]
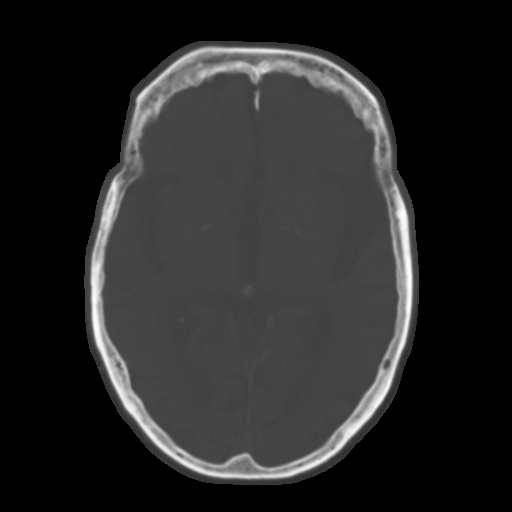
[im 19/34  brain]
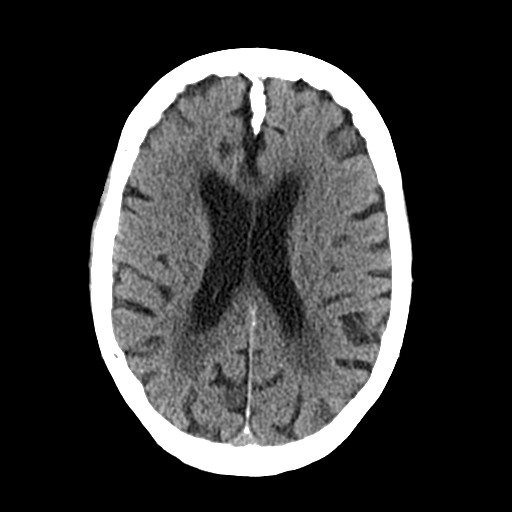
[im 22/34  brain]
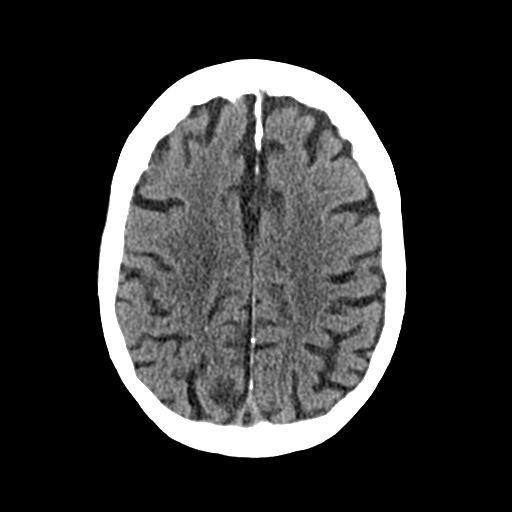
[im 26/34  brain]
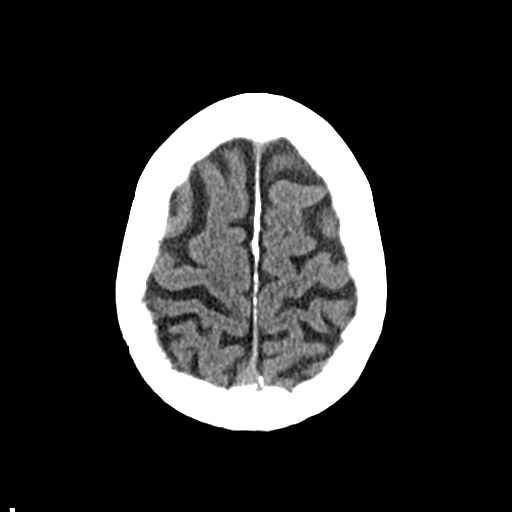
[im 28/34  brain]
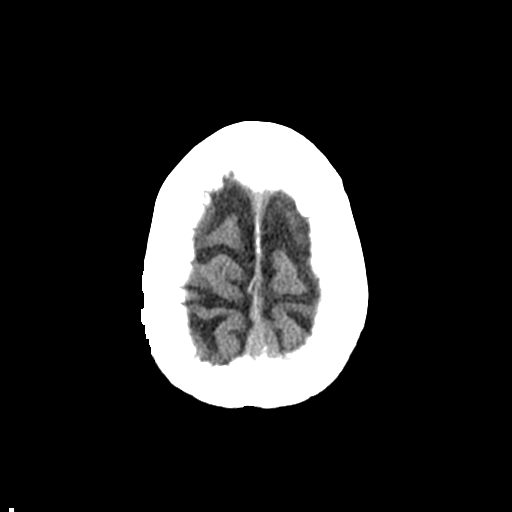
[im 28/34  bone]
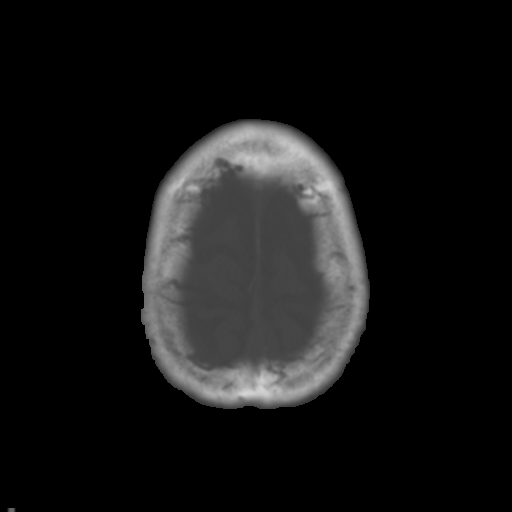
[im 31/34  brain]
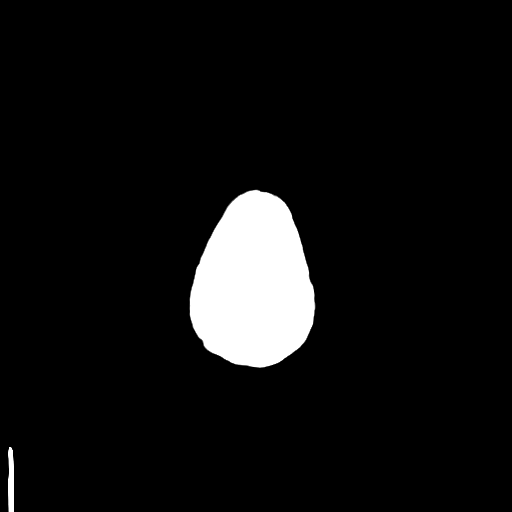

[Series 4: coronal soft tissue · coronal · 0.34mm/px · 3 of 73 slices shown]
[im 25/73  brain]
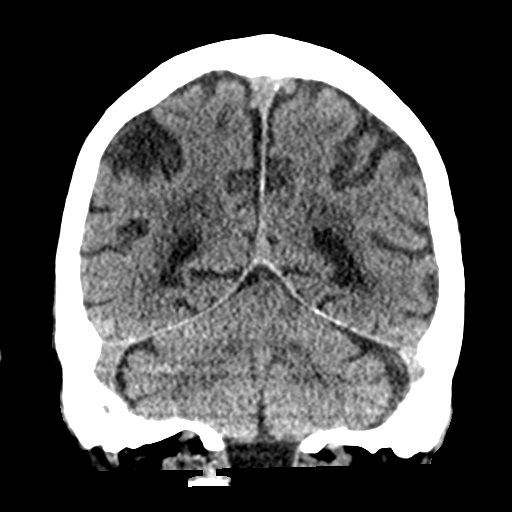
[im 33/73  brain]
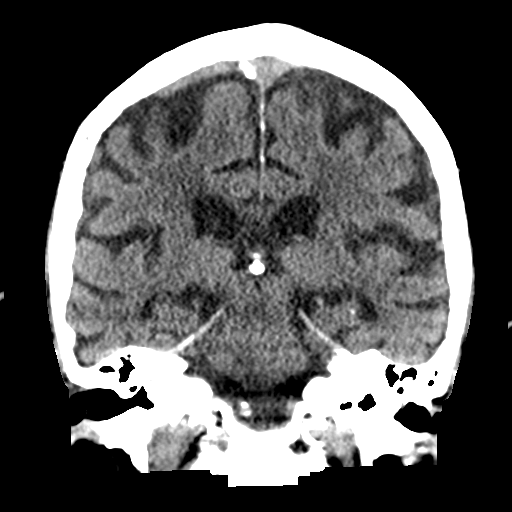
[im 41/73  brain]
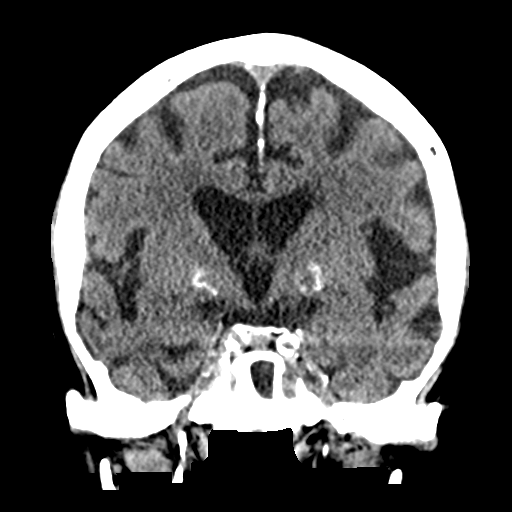

[Series 5: sagittal soft tissue · sagittal · 0.33mm/px · 3 of 58 slices shown]
[im 20/58  brain]
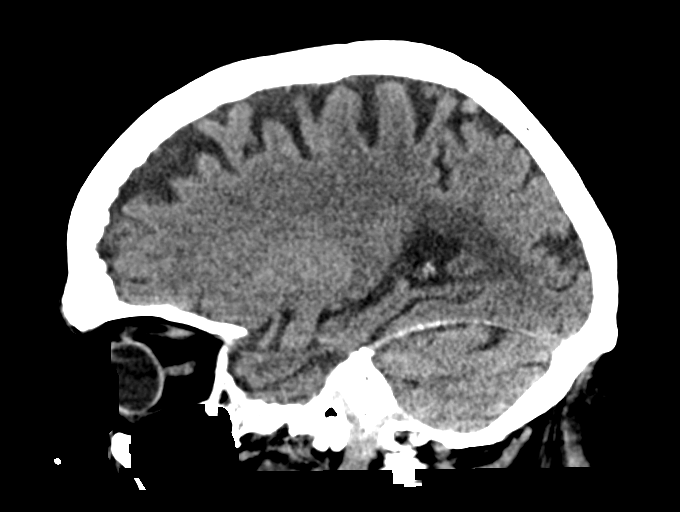
[im 29/58  brain]
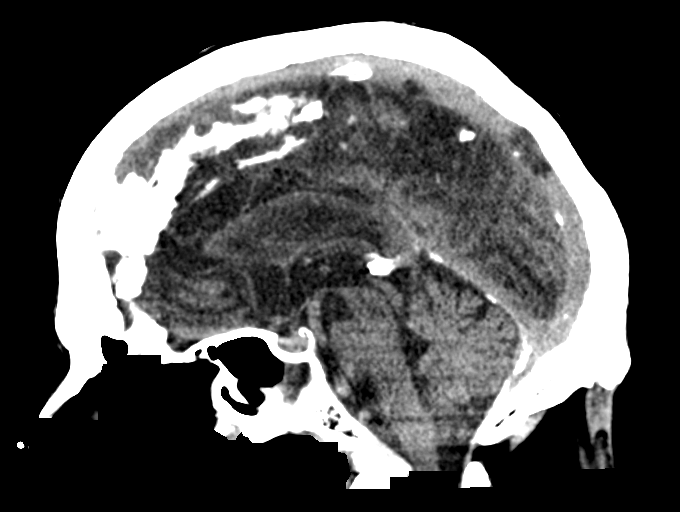
[im 39/58  brain]
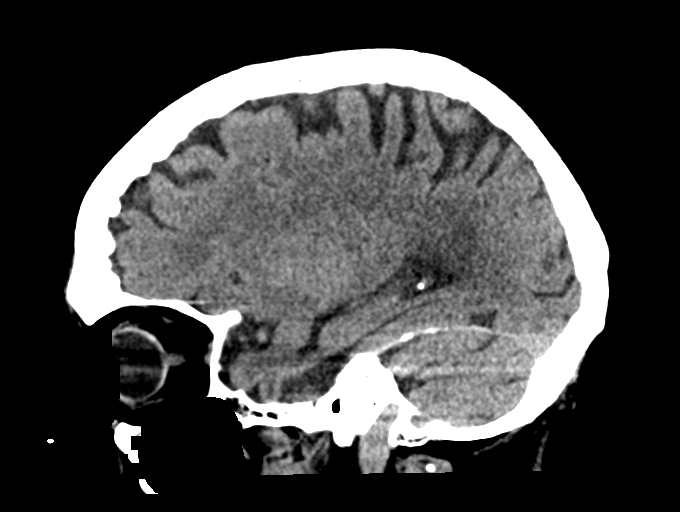

[16 of 47 positions shown; findings below may reference images not displayed]

FINDINGS: Brain: Stable mild cerebral atrophy. No evidence for acute
hemorrhage, mass lesion, midline shift, hydrocephalus or large
infarct. Stable low-density in the white matter is suggestive for
chronic changes.

Vascular: No hyperdense vessel or unexpected calcification.

Skull: Normal. Negative for fracture or focal lesion.

Sinuses/Orbits: Mucosal thickening and possible polyp in the left
sphenoid sinus. Findings are similar to the previous examination.

Other: None
IMPRESSION: 1. No acute intracranial abnormality.
2. Persistent left sphenoid sinus disease.
